# Patient Record
Sex: Female | Born: 1980
Health system: Southern US, Community
[De-identification: ages and names within clinical notes are randomized; demographics above are authoritative.]

## PROBLEM LIST (undated history)

## (undated) ENCOUNTER — Inpatient Hospital Stay (HOSPITAL_COMMUNITY): Payer: Self-pay

## (undated) ENCOUNTER — Inpatient Hospital Stay (HOSPITAL_COMMUNITY): Payer: 59

## (undated) DIAGNOSIS — T8859XA Other complications of anesthesia, initial encounter: Secondary | ICD-10-CM

## (undated) DIAGNOSIS — R112 Nausea with vomiting, unspecified: Secondary | ICD-10-CM

## (undated) DIAGNOSIS — Z87828 Personal history of other (healed) physical injury and trauma: Secondary | ICD-10-CM

## (undated) DIAGNOSIS — Z9889 Other specified postprocedural states: Secondary | ICD-10-CM

## (undated) DIAGNOSIS — R002 Palpitations: Secondary | ICD-10-CM

## (undated) DIAGNOSIS — R87629 Unspecified abnormal cytological findings in specimens from vagina: Secondary | ICD-10-CM

## (undated) DIAGNOSIS — Z9689 Presence of other specified functional implants: Secondary | ICD-10-CM

## (undated) DIAGNOSIS — F419 Anxiety disorder, unspecified: Secondary | ICD-10-CM

## (undated) DIAGNOSIS — N809 Endometriosis, unspecified: Secondary | ICD-10-CM

## (undated) DIAGNOSIS — T4145XA Adverse effect of unspecified anesthetic, initial encounter: Secondary | ICD-10-CM

## (undated) DIAGNOSIS — G43909 Migraine, unspecified, not intractable, without status migrainosus: Secondary | ICD-10-CM

## (undated) HISTORY — PX: ABDOMINOPLASTY: SUR9

## (undated) HISTORY — PX: OTHER SURGICAL HISTORY: SHX169

## (undated) HISTORY — PX: CARPAL TUNNEL RELEASE: SHX101

## (undated) HISTORY — PX: PLACEMENT OF BREAST IMPLANTS: SHX6334

## (undated) HISTORY — PX: CERVICAL BIOPSY: SHX590

---

## 1999-02-09 ENCOUNTER — Other Ambulatory Visit: Admission: RE | Admit: 1999-02-09 | Discharge: 1999-02-09 | Payer: Self-pay | Admitting: Obstetrics and Gynecology

## 1999-09-29 ENCOUNTER — Inpatient Hospital Stay (HOSPITAL_COMMUNITY): Admission: AD | Admit: 1999-09-29 | Discharge: 1999-09-29 | Payer: Self-pay | Admitting: Obstetrics & Gynecology

## 1999-11-09 ENCOUNTER — Other Ambulatory Visit: Admission: RE | Admit: 1999-11-09 | Discharge: 1999-11-09 | Payer: Self-pay | Admitting: Obstetrics & Gynecology

## 1999-11-30 ENCOUNTER — Ambulatory Visit (HOSPITAL_COMMUNITY): Admission: RE | Admit: 1999-11-30 | Discharge: 1999-11-30 | Payer: Self-pay | Admitting: Obstetrics and Gynecology

## 1999-11-30 ENCOUNTER — Encounter: Payer: Self-pay | Admitting: Obstetrics and Gynecology

## 2000-02-04 ENCOUNTER — Inpatient Hospital Stay (HOSPITAL_COMMUNITY): Admission: AD | Admit: 2000-02-04 | Discharge: 2000-02-04 | Payer: Self-pay | Admitting: Obstetrics & Gynecology

## 2000-02-24 ENCOUNTER — Inpatient Hospital Stay (HOSPITAL_COMMUNITY): Admission: AD | Admit: 2000-02-24 | Discharge: 2000-02-24 | Payer: Self-pay | Admitting: Obstetrics and Gynecology

## 2000-03-30 ENCOUNTER — Inpatient Hospital Stay (HOSPITAL_COMMUNITY): Admission: AD | Admit: 2000-03-30 | Discharge: 2000-03-30 | Payer: Self-pay | Admitting: Obstetrics and Gynecology

## 2000-04-21 ENCOUNTER — Inpatient Hospital Stay (HOSPITAL_COMMUNITY): Admission: AD | Admit: 2000-04-21 | Discharge: 2000-04-21 | Payer: Self-pay | Admitting: Obstetrics and Gynecology

## 2000-05-31 ENCOUNTER — Inpatient Hospital Stay (HOSPITAL_COMMUNITY): Admission: AD | Admit: 2000-05-31 | Discharge: 2000-06-02 | Payer: Self-pay | Admitting: Obstetrics and Gynecology

## 2001-12-22 ENCOUNTER — Other Ambulatory Visit: Admission: RE | Admit: 2001-12-22 | Discharge: 2001-12-22 | Payer: Self-pay | Admitting: Obstetrics and Gynecology

## 2002-05-02 ENCOUNTER — Encounter: Payer: Self-pay | Admitting: Emergency Medicine

## 2002-05-02 ENCOUNTER — Emergency Department (HOSPITAL_COMMUNITY): Admission: EM | Admit: 2002-05-02 | Discharge: 2002-05-02 | Payer: Self-pay

## 2002-07-08 ENCOUNTER — Inpatient Hospital Stay (HOSPITAL_COMMUNITY): Admission: AD | Admit: 2002-07-08 | Discharge: 2002-07-08 | Payer: Self-pay | Admitting: Obstetrics and Gynecology

## 2002-07-30 ENCOUNTER — Inpatient Hospital Stay (HOSPITAL_COMMUNITY): Admission: AD | Admit: 2002-07-30 | Discharge: 2002-08-02 | Payer: Self-pay | Admitting: Obstetrics and Gynecology

## 2002-08-09 ENCOUNTER — Encounter: Admission: RE | Admit: 2002-08-09 | Discharge: 2002-09-08 | Payer: Self-pay | Admitting: Obstetrics and Gynecology

## 2003-02-18 ENCOUNTER — Other Ambulatory Visit: Admission: RE | Admit: 2003-02-18 | Discharge: 2003-02-18 | Payer: Self-pay | Admitting: Obstetrics and Gynecology

## 2003-02-21 ENCOUNTER — Emergency Department (HOSPITAL_COMMUNITY): Admission: EM | Admit: 2003-02-21 | Discharge: 2003-02-21 | Payer: Self-pay | Admitting: Emergency Medicine

## 2003-02-23 ENCOUNTER — Ambulatory Visit (HOSPITAL_COMMUNITY): Admission: RE | Admit: 2003-02-23 | Discharge: 2003-02-23 | Payer: Self-pay | Admitting: Obstetrics and Gynecology

## 2004-06-12 ENCOUNTER — Other Ambulatory Visit: Admission: RE | Admit: 2004-06-12 | Discharge: 2004-06-12 | Payer: Self-pay | Admitting: Obstetrics and Gynecology

## 2005-01-18 ENCOUNTER — Inpatient Hospital Stay (HOSPITAL_COMMUNITY): Admission: AD | Admit: 2005-01-18 | Discharge: 2005-01-18 | Payer: Self-pay | Admitting: Obstetrics and Gynecology

## 2005-01-25 ENCOUNTER — Ambulatory Visit: Payer: Self-pay | Admitting: Cardiology

## 2005-02-16 ENCOUNTER — Ambulatory Visit: Payer: Self-pay | Admitting: Cardiology

## 2005-03-02 ENCOUNTER — Other Ambulatory Visit: Admission: RE | Admit: 2005-03-02 | Discharge: 2005-03-02 | Payer: Self-pay | Admitting: Obstetrics and Gynecology

## 2005-04-19 ENCOUNTER — Inpatient Hospital Stay (HOSPITAL_COMMUNITY): Admission: AD | Admit: 2005-04-19 | Discharge: 2005-04-19 | Payer: Self-pay | Admitting: Obstetrics and Gynecology

## 2005-04-21 ENCOUNTER — Inpatient Hospital Stay (HOSPITAL_COMMUNITY): Admission: AD | Admit: 2005-04-21 | Discharge: 2005-04-21 | Payer: Self-pay | Admitting: Obstetrics & Gynecology

## 2005-04-28 ENCOUNTER — Inpatient Hospital Stay (HOSPITAL_COMMUNITY): Admission: AD | Admit: 2005-04-28 | Discharge: 2005-04-28 | Payer: Self-pay | Admitting: Obstetrics & Gynecology

## 2005-05-09 ENCOUNTER — Inpatient Hospital Stay (HOSPITAL_COMMUNITY): Admission: AD | Admit: 2005-05-09 | Discharge: 2005-05-09 | Payer: Self-pay | Admitting: Obstetrics and Gynecology

## 2005-05-11 ENCOUNTER — Inpatient Hospital Stay (HOSPITAL_COMMUNITY): Admission: AD | Admit: 2005-05-11 | Discharge: 2005-05-12 | Payer: Self-pay | Admitting: Obstetrics & Gynecology

## 2005-07-03 ENCOUNTER — Other Ambulatory Visit: Admission: RE | Admit: 2005-07-03 | Discharge: 2005-07-03 | Payer: Self-pay | Admitting: Obstetrics and Gynecology

## 2006-01-11 ENCOUNTER — Emergency Department (HOSPITAL_COMMUNITY): Admission: EM | Admit: 2006-01-11 | Discharge: 2006-01-11 | Payer: Self-pay | Admitting: Emergency Medicine

## 2008-08-03 ENCOUNTER — Emergency Department (HOSPITAL_COMMUNITY): Admission: EM | Admit: 2008-08-03 | Discharge: 2008-08-03 | Payer: Self-pay | Admitting: Emergency Medicine

## 2009-08-20 ENCOUNTER — Emergency Department (HOSPITAL_BASED_OUTPATIENT_CLINIC_OR_DEPARTMENT_OTHER): Admission: EM | Admit: 2009-08-20 | Discharge: 2009-08-20 | Payer: Self-pay | Admitting: Emergency Medicine
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
## 2011-03-09 NOTE — Consult Note (Signed)
Kelly Buck, Kelly Buck              ACCOUNT NO.:  [id number]   MEDICAL RECORD NO.:  [id number]           PATIENT TYPE:   LOCATION:                                 FACILITY:   PHYSICIAN:  Rollene Rotunda, M.D.   DATE OF BIRTH:  1981/06/22   DATE OF CONSULTATION:  02/16/2005  DATE OF DISCHARGE:                                   CONSULTATION   REASON FOR PRESENTATION:  Evaluate the patient with palpitations.   HISTORY OF PRESENT ILLNESS:  The patient is a lovely 30 year old white  female who is now about [redacted] weeks pregnant with her third child.  I saw her  on April 6.  She had been having episodes of palpitations which have been  lasting up to 45 minutes.  She was seen at the emergency room at [facility].  I did some routine blood work including a magnesium and basic  metabolic profile.  These were within normal limits.  Other labs have been  normal prior to this including TSH.  I did place a Holter monitor which  demonstrated normal sinus rhythm or sinus tachycardia sporadically.  She had  a few PACs.  There was no ventricular ectopy recorded, at all.  The PACs  were infrequent.  The patient did not report any symptoms wearing this.  She  now returns for follow up.  She says that since I saw her, she has been able  to remove some of the stress in her life.  She has been able to do a few of  the household chores.  She says she thinks this has made a big difference.  She is now rarely having palpitations.  They may last for a couple of  minutes at the worse.  She has had no presyncope or syncope.  She has had no  chest pain.  She denies any shortness of breath, PND, or orthopnea.  She has  also cut out the small amount of caffeine that she was taking.   PAST MEDICAL HISTORY:  Normal pregnancies x 2.  Otherwise, negative.   ALLERGIES:  None.   MEDICATIONS:  Prenatal vitamins.   REVIEW OF SYMPTOMS:  As stated in the HPI and negative for all other  systems.   PHYSICAL  EXAMINATION:  GENERAL:  The patient is in no distress.  VITAL SIGNS:  Blood pressure 120/72, heart rate 76 and regular, weight 215  pounds, body mass index 36.  NECK:  No jugular venous distention, wave form within normal limits, carotid  upstroke brisk and symmetric, no bruits, no thyromegaly.  LYMPHATICS:  No cervical, axillary, or inguinal adenopathy.  LUNGS:  Clear to auscultation bilaterally.  HEART:  PMI non displaced or sustained.  S1 and S2 within normal limits.  No  S3, S4, or murmurs.  ABDOMEN:  Gravid, positive bowel sounds, no rebound, no guarding, no obvious  organomegaly.  SKIN:  No rashes, no nodules.  EXTREMITIES:  2+ pulses throughout, no edema, no cyanosis, no clubbing.  NEUROLOGICAL:  Oriented to person, place and time.  Cranial nerves 2-12  grossly intact.  Motor grossly intact.  LABORATORY DATA:  EKG not done this visit.   ASSESSMENT/PLAN:  Palpitations.  The patient has had a few episodes of PACs  recorded on her event recorder.  She has not had a documented arrhythmia  otherwise.  She had no premature ventricular contractions, at all.  She has  a perfectly normal baseline EKG, otherwise.  There are no high risk  historical features, in particular, no syncope or presyncope.  Based on all  of this, the patient most likely was experiencing some supraventricular  arrhythmia that we cannot document. It is very unlikely to be ventricular or  to be related to structural heart disease.  Given this, will follow her  symptomatically.   FOLLOW UP:  I would like to see her back in June before her baby is due in  July.  She knows to call me if she has any worsening palpitations and to  present to the emergency room  should she have any presyncope or syncope.                                        ___________________________________________  Rollene Rotunda, M.D.    JH/MEDQ  D:  02/16/2005  T:  02/16/2005  Job:  [id number]   cc:   Gerrit Friends. Aldona Bar, M.D.  [address], [address]  [place]  [place] [address]  Fax: [phone]

## 2012-04-08 ENCOUNTER — Other Ambulatory Visit (HOSPITAL_COMMUNITY)
Admission: RE | Admit: 2012-04-08 | Discharge: 2012-04-08 | Disposition: A | Discharge status: Home / Self Care | Payer: BC Managed Care – PPO | Source: Ambulatory Visit | Attending: Family Medicine | Admitting: Family Medicine

## 2012-04-08 DIAGNOSIS — Z124 Encounter for screening for malignant neoplasm of cervix: Secondary | ICD-10-CM | POA: Insufficient documentation

## 2013-07-20 ENCOUNTER — Emergency Department (HOSPITAL_COMMUNITY)
Admission: EM | Admit: 2013-07-20 | Discharge: 2013-07-20 | Disposition: A | Discharge status: Home / Self Care | Payer: BC Managed Care – PPO | Attending: Emergency Medicine | Admitting: Emergency Medicine

## 2013-07-20 ENCOUNTER — Encounter (HOSPITAL_COMMUNITY): Payer: Self-pay | Admitting: Nurse Practitioner

## 2013-07-20 DIAGNOSIS — T1590XA Foreign body on external eye, part unspecified, unspecified eye, initial encounter: Secondary | ICD-10-CM | POA: Insufficient documentation

## 2013-07-20 DIAGNOSIS — Y939 Activity, unspecified: Secondary | ICD-10-CM | POA: Insufficient documentation

## 2013-07-20 DIAGNOSIS — Y929 Unspecified place or not applicable: Secondary | ICD-10-CM | POA: Insufficient documentation

## 2013-07-20 DIAGNOSIS — H5710 Ocular pain, unspecified eye: Secondary | ICD-10-CM | POA: Insufficient documentation

## 2013-07-20 DIAGNOSIS — H5712 Ocular pain, left eye: Secondary | ICD-10-CM

## 2013-07-20 MED ORDER — TOBRAMYCIN 0.3 % OP SOLN
2.0000 [drp] | Freq: Four times a day (QID) | OPHTHALMIC | Status: DC
  Administered 2013-07-20: 2 [drp] via OPHTHALMIC
  Filled 2013-07-20: qty 10

## 2013-07-20 MED ORDER — FLUORESCEIN SODIUM 1 MG OP STRP
1.0000 | ORAL_STRIP | Freq: Once | OPHTHALMIC | Status: AC
  Administered 2013-07-20: 1 via OPHTHALMIC
  Filled 2013-07-20: qty 1

## 2013-07-20 MED ORDER — PROPARACAINE HCL 0.5 % OP SOLN
2.0000 [drp] | Freq: Once | OPHTHALMIC | Status: AC
  Administered 2013-07-20: 2 [drp] via OPHTHALMIC
  Filled 2013-07-20: qty 15

## 2013-07-20 NOTE — ED Provider Notes (Signed)
CSN: [id number]     Arrival date & time 07/20/13  0906 History  This chart was scribed for non-physician practitioner Allean Found, PA-C working with Layla Maw Ward, DO by Valera Castle, ED scribe. This patient was seen in room TR04C/TR04C and the patient's care was started at 9:23 AM.    Chief Complaint  Patient presents with  . Eye Injury    Patient is a 32 y.o. female presenting with eye injury. The history is provided by the patient. No language interpreter was used.  Eye Injury This is a new problem. The current episode started 2 days ago. The problem occurs constantly. The problem has not changed since onset.Nothing aggravates the symptoms. Nothing relieves the symptoms.   HPI Comments: Kelly Buck is a 32 y.o. female who presents to the Emergency Department complaining of sudden, moderate, constant left eye pain, with redness, onset 2 days ago. She reports that she tried to remove an object from her eye last night, but was unsuccessful, and thinks she may have scratched the surface of her eye. She reports that the object does not feel loose, but rather stuck in one position. She tried to sleep it off, but the pain was still there this morning, and she reports a visible moon-shaped object in her eye. She denies light sensitivity, but states that her vision is blurry. She denies fever, and any other associated symptoms. She denies having a medical history.   No past medical history on file. No past surgical history on file. No family history on file. History  Substance Use Topics  . Smoking status: Not on file  . Smokeless tobacco: Not on file  . Alcohol Use: Not on file   OB History   No data available     Review of Systems  Constitutional: Negative for fever.  Eyes: Positive for pain (Left eye pain.) and redness. Negative for photophobia, discharge and visual disturbance.  All other systems reviewed and are negative.    Allergies  Review of patient's allergies  indicates not on file.  Home Medications  No current outpatient prescriptions on file.  Triage Vitals: BP 132/90  Pulse 85  Temp(Src) 97.7 F (36.5 C) (Oral)  SpO2 98%  Physical Exam  Nursing note and vitals reviewed. Constitutional: She is oriented to person, place, and time. She appears well-developed and well-nourished. No distress.  HENT:  Head: Normocephalic and atraumatic.  Eyes: Conjunctivae and EOM are normal. Pupils are equal, round, and reactive to light. Right eye exhibits no discharge. Left eye exhibits no discharge.  No corneal abrasion. Lateral sclera injection without laceration or foreign body. No pain with full ROM. External eye adnexa normal.   Neck: Neck supple. No tracheal deviation present.  Cardiovascular: Normal rate, regular rhythm and normal heart sounds.   Pulmonary/Chest: Effort normal and breath sounds normal. No respiratory distress.  Abdominal: Soft. There is no tenderness.  Musculoskeletal: Normal range of motion.  Neurological: She is alert and oriented to person, place, and time.  Skin: Skin is warm and dry.  Psychiatric: She has a normal mood and affect. Her behavior is normal.    ED Course  Procedures (including critical care time)  DIAGNOSTIC STUDIES: Oxygen Saturation is 98% on room air, normal by my interpretation.    COORDINATION OF CARE: 9:37 AM-Discussed treatment plan which includes Tobrex, Alcaine, and one fluorescein opthalmic strip with pt at bedside and pt agreed to plan.     Labs Review Labs Reviewed - No data to display  Imaging Review No results found.  MDM  No diagnosis found. 1. Left eye pain  No FB, scleral laceration, chemosis or apparent corneal injury. Will cover with abx drops and refer to ophthalmology if symptoms do not improve in 1-2 days.      I personally performed the services described in this documentation, which was scribed in my presence. The recorded information has been reviewed and is accurate.      Arnoldo Hooker, PA-C 07/20/13 1044

## 2013-07-20 NOTE — ED Notes (Signed)
States she felt something in her L eye yesterday, tried to get it out with her finger, and had a burning pain in her eye. This am she woke with more L eye pain and noticed redness "and it looks like theres something in my eye." states her vision is blurry in the L eye. Denies any injuries

## 2013-07-20 NOTE — ED Notes (Signed)
Redness noted in lateral aspect of left eye. Vision intact.

## 2013-07-20 NOTE — ED Provider Notes (Signed)
Medical screening examination/treatment/procedure(s) were performed by non-physician practitioner and as supervising physician I was immediately available for consultation/collaboration.   Layla Maw Adeeb Konecny, DO 07/20/13 1605

## 2013-09-24 LAB — OB RESULTS CONSOLE GC/CHLAMYDIA
Chlamydia: NEGATIVE
Gonorrhea: NEGATIVE

## 2013-10-19 ENCOUNTER — Ambulatory Visit (HOSPITAL_COMMUNITY)
Admission: RE | Admit: 2013-10-19 | Discharge: 2013-10-19 | Disposition: A | Discharge status: Home / Self Care | Payer: Medicaid Other | Source: Ambulatory Visit | Attending: Obstetrics and Gynecology | Admitting: Obstetrics and Gynecology

## 2013-10-19 ENCOUNTER — Other Ambulatory Visit (HOSPITAL_COMMUNITY): Payer: Self-pay | Admitting: Obstetrics and Gynecology

## 2013-10-19 DIAGNOSIS — O208 Other hemorrhage in early pregnancy: Secondary | ICD-10-CM | POA: Insufficient documentation

## 2013-10-19 DIAGNOSIS — O009 Unspecified ectopic pregnancy without intrauterine pregnancy: Secondary | ICD-10-CM
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
## 2013-11-20 LAB — OB RESULTS CONSOLE ABO/RH: RH Type: POSITIVE

## 2013-11-20 LAB — OB RESULTS CONSOLE RPR: RPR: NONREACTIVE

## 2013-11-20 LAB — OB RESULTS CONSOLE HEPATITIS B SURFACE ANTIGEN: Hepatitis B Surface Ag: NEGATIVE

## 2013-11-20 LAB — OB RESULTS CONSOLE HIV ANTIBODY (ROUTINE TESTING): HIV: NONREACTIVE

## 2013-11-20 LAB — OB RESULTS CONSOLE RUBELLA ANTIBODY, IGM: RUBELLA: IMMUNE

## 2014-01-28 ENCOUNTER — Inpatient Hospital Stay (HOSPITAL_COMMUNITY)
Admission: AD | Admit: 2014-01-28 | Discharge: 2014-01-28 | Disposition: A | Discharge status: Home / Self Care | Payer: Medicaid Other | Source: Ambulatory Visit | Attending: Obstetrics and Gynecology | Admitting: Obstetrics and Gynecology

## 2014-01-28 ENCOUNTER — Encounter (HOSPITAL_COMMUNITY): Payer: Self-pay | Admitting: *Deleted

## 2014-01-28 DIAGNOSIS — M545 Low back pain, unspecified: Secondary | ICD-10-CM | POA: Insufficient documentation

## 2014-01-28 DIAGNOSIS — O99891 Other specified diseases and conditions complicating pregnancy: Secondary | ICD-10-CM | POA: Insufficient documentation

## 2014-01-28 DIAGNOSIS — O9989 Other specified diseases and conditions complicating pregnancy, childbirth and the puerperium: Secondary | ICD-10-CM

## 2014-01-28 DIAGNOSIS — R109 Unspecified abdominal pain: Secondary | ICD-10-CM | POA: Insufficient documentation

## 2014-01-28 DIAGNOSIS — M549 Dorsalgia, unspecified: Secondary | ICD-10-CM

## 2014-01-28 LAB — URINALYSIS, ROUTINE W REFLEX MICROSCOPIC
Bilirubin Urine: NEGATIVE
Glucose, UA: NEGATIVE mg/dL
HGB URINE DIPSTICK: NEGATIVE
Ketones, ur: NEGATIVE mg/dL
Leukocytes, UA: NEGATIVE
Nitrite: NEGATIVE
PH: 7 (ref 5.0–8.0)
Protein, ur: NEGATIVE mg/dL
SPECIFIC GRAVITY, URINE: 1.01 (ref 1.005–1.030)
Urobilinogen, UA: 0.2 mg/dL (ref 0.0–1.0)

## 2014-01-28 NOTE — MAU Provider Note (Signed)
Chief Complaint:  Abdominal Pain and Back Pain   First Provider Initiated Contact with Patient 01/28/14 1719      HPI: Kelly Buck is a 33 y.o. [phone] at [redacted]w[redacted]d who presents to maternity admissions reporting  Onset about 2 AM today of continuous dull low back pain and lower abdominal cramping with intermittent abdominal tightening. No antecedent lifting or strenuous activity. Not related to movement, activity or rest. No previous episodes. Denies dysuria, hematuria, urgency or frequency of urination. No irritative vaginal discharge. Denies leakage of fluid or vaginal bleeding. Good fetal movement.   Pregnancy Course: Uncomplicated  Past Medical History: Past Medical History  Diagnosis Date  . Medical history non-contributory     Past obstetric history: OB History  Gravida Para Term Preterm AB SAB TAB Ectopic Multiple Living  5 3 3  1 1    3     # Outcome Date GA Lbr Len/2nd Weight Sex Delivery Anes PTL Lv  5 CUR           4 SAB           3 TRM           2 TRM           1 TRM             NSVD x3  Past Surgical History: Past Surgical History  Procedure Laterality Date  . Placement of breast implants    . Carpal tunnel release    . Tummy tuck       Social History: History  Substance Use Topics  . Smoking status: Never Smoker   . Smokeless tobacco: Not on file  . Alcohol Use: No    Allergies: No Known Allergies  Meds:  Prescriptions prior to admission  Medication Sig Dispense Refill  . Multiple Vitamin (MULTIVITAMIN WITH MINERALS) TABS tablet Take 1 tablet by mouth daily.        ROS: Pertinent findings in history of present illness.  Physical Exam  Blood pressure 131/74, pulse 102, temperature 99.8 F (37.7 C), temperature source Oral, resp. rate 20, height 5\' 5"  (1.651 m), weight 103.42 kg (228 lb). GENERAL: Well-developed, well-nourished female in no acute distress.  HEENT: normocephalic HEART: normal rate RESP: normal effort ABDOMEN: Soft, non-tender,  gravid appropriate for gestational age, DT 26 BACK: Negative CVAT, no paraspinous tenderness to palpation EXTREMITIES: Nontender, no edema NEURO: alert and oriented  Dilation: Closed Effacement (%): Thick Cervical Position: Posterior Exam by:: D. Chad Donoghue CNM  Toco: No contractions   Labs: Results for orders placed during the hospital encounter of 01/28/14 (from the past 24 hour(s))  URINALYSIS, ROUTINE W REFLEX MICROSCOPIC     Status: None   Collection Time    01/28/14  5:00 PM      Result Value Ref Range   Color, Urine YELLOW  YELLOW   APPearance CLEAR  CLEAR   Specific Gravity, Urine 1.010  1.005 - 1.030   pH 7.0  5.0 - 8.0   Glucose, UA NEGATIVE  NEGATIVE mg/dL   Hgb urine dipstick NEGATIVE  NEGATIVE   Bilirubin Urine NEGATIVE  NEGATIVE   Ketones, ur NEGATIVE  NEGATIVE mg/dL   Protein, ur NEGATIVE  NEGATIVE mg/dL   Urobilinogen, UA 0.2  0.0 - 1.0 mg/dL   Nitrite NEGATIVE  NEGATIVE   Leukocytes, UA NEGATIVE  NEGATIVE    Imaging:  No results found.  MAU Course: C/W Dr. Philis Pique  Assessment: 1. Back pain complicating pregnancy in second trimester  Multip at [redacted]w[redacted]d  Plan: Discharge home with reassurance AVS: back precautions, exercises Acetaminophen for pain    Medication List         acetaminophen 500 MG tablet  Commonly known as:  TYLENOL  Take 500 mg by mouth every 6 (six) hours as needed for moderate pain.     dimenhyDRINATE 50 MG tablet  Commonly known as:  DRAMAMINE  Take 50 mg by mouth every 8 (eight) hours as needed for dizziness.     prenatal vitamin w/FE, FA 29-1 MG Chew chewable tablet  Chew 1 tablet by mouth daily at 12 noon.       Follow-up Information   Follow up with HORVATH,MICHELLE A, MD. (Keep your scheduled prenatal appointment)    Specialty:  Obstetrics and Gynecology   Contact information:   [address] [place] [address] [phone]       Lorene Dy, CNM 01/28/2014 5:36 PM

## 2014-01-28 NOTE — MAU Note (Signed)
Woke up during the night with pain when got up to urinate.  Sharp pain lower abd.  When woke up has had low back pain.pains have continued. Having some really low pressure, crotch area. Denies frequency, urgency or pain with urination.  Neg CVA pain.

## 2014-01-28 NOTE — Discharge Instructions (Signed)
Back Pain in Pregnancy °Back pain during pregnancy is common. It happens in about half of all pregnancies. It is important for you and your baby that you remain active during your pregnancy. If you feel that back pain is not allowing you to remain active or sleep well, it is time to see your caregiver. Back pain may be caused by several factors related to changes during your pregnancy. Fortunately, unless you had trouble with your back before your pregnancy, the pain is likely to get better after you deliver. °Low back pain usually occurs between the fifth and seventh months of pregnancy. It can, however, happen in the first couple months. Factors that increase the risk of back problems include:  °· Previous back problems. °· Injury to your back. °· Having twins or multiple births. °· A chronic cough. °· Stress. °· Job-related repetitive motions. °· Muscle or spinal disease in the back. °· Family history of back problems, ruptured (herniated) discs, or osteoporosis. °· Depression, anxiety, and panic attacks. °CAUSES  °· When you are pregnant, your body produces a hormone called relaxin. This hormone makes the ligaments connecting the low back and pubic bones more flexible. This flexibility allows the baby to be delivered more easily. When your ligaments are loose, your muscles need to work harder to support your back. Soreness in your back can come from tired muscles. Soreness can also come from back tissues that are irritated since they are receiving less support. °· As the baby grows, it puts pressure on the nerves and blood vessels in your pelvis. This can cause back pain. °· As the baby grows and gets heavier during pregnancy, the uterus pushes the stomach muscles forward and changes your center of gravity. This makes your back muscles work harder to maintain good posture. °SYMPTOMS  °Lumbar pain during pregnancy °Lumbar pain during pregnancy usually occurs at or above the waist in the center of the back. There  may be pain and numbness that radiates into your leg or foot. This is similar to low back pain experienced by non-pregnant women. It usually increases with sitting for long periods of time, standing, or repetitive lifting. Tenderness may also be present in the muscles along your upper back. °Posterior pelvic pain during pregnancy °Pain in the back of the pelvis is more common than lumbar pain in pregnancy. It is a deep pain felt in your side at the waistline, or across the tailbone (sacrum), or in both places. You may have pain on one or both sides. This pain can also go into the buttocks and backs of the upper thighs. Pubic and groin pain may also be present. The pain does not quickly resolve with rest, and morning stiffness may also be present. °Pelvic pain during pregnancy can be brought on by most activities. A high level of fitness before and during pregnancy may or may not prevent this problem. Labor pain is usually 1 to 2 minutes apart, lasts for about 1 minute, and involves a bearing down feeling or pressure in your pelvis. However, if you are at term with the pregnancy, constant low back pain can be the beginning of early labor, and you should be aware of this. °DIAGNOSIS  °X-rays of the back should not be done during the first 12 to 14 weeks of the pregnancy and only when absolutely necessary during the rest of the pregnancy. MRIs do not give off radiation and are safe during pregnancy. MRIs also should only be done when absolutely necessary. °HOME CARE INSTRUCTIONS °· Exercise   as directed by your caregiver. Exercise is the most effective way to prevent or manage back pain. If you have a back problem, it is especially important to avoid sports that require sudden body movements. Swimming and walking are great activities.  Do not stand in one place for long periods of time.  Do not wear high heels.  Sit in chairs with good posture. Use a pillow on your lower back if necessary. Make sure your head  rests over your shoulders and is not hanging forward.  Try sleeping on your side, preferably the left side, with a pillow or two between your legs. If you are sore after a night's rest, your bedmay betoo soft.Try placing a board between your mattress and box spring.  Listen to your body when lifting.If you are experiencing pain, ask for help or try bending yourknees more so you can use your leg muscles rather than your back muscles. Squat down when picking up something from the floor. Do not bend over.  Eat a healthy diet. Try to gain weight within your caregiver's recommendations.  Use heat or cold packs 3 to 4 times a day for 15 minutes to help with the pain.  Only take over-the-counter or prescription medicines for pain, discomfort, or fever as directed by your caregiver. Sudden (acute) back pain  Use bed rest for only the most extreme, acute episodes of back pain. Prolonged bed rest over 48 hours will aggravate your condition.  Ice is very effective for acute conditions.  Put ice in a plastic bag.  Place a towel between your skin and the bag.  Leave the ice on for 10 to 20 minutes every 2 hours, or as needed.  Using heat packs for 30 minutes prior to activities is also helpful. Continued back pain See your caregiver if you have continued problems. Your caregiver can help or refer you for appropriate physical therapy. With conditioning, most back problems can be avoided. Sometimes, a more serious issue may be the cause of back pain. You should be seen right away if new problems seem to be developing. Your caregiver may recommend:  A maternity girdle.  An elastic sling.  A back brace.  A massage therapist or acupuncture. SEEK MEDICAL CARE IF:   You are not able to do most of your daily activities, even when taking the pain medicine you were given.  You need a referral to a physical therapist or chiropractor.  You want to try acupuncture. SEEK IMMEDIATE MEDICAL CARE  IF:  You develop numbness, tingling, weakness, or problems with the use of your arms or legs.  You develop severe back pain that is no longer relieved with medicines.  You have a sudden change in bowel or bladder control.  You have increasing pain in other areas of the body.  You develop shortness of breath, dizziness, or fainting.  You develop nausea, vomiting, or sweating.  You have back pain which is similar to labor pains.  You have back pain along with your water breaking or vaginal bleeding.  You have back pain or numbness that travels down your leg.  Your back pain developed after you fell.  You develop pain on one side of your back. You may have a kidney stone.  You see blood in your urine. You may have a bladder infection or kidney stone.  You have back pain with blisters. You may have shingles. Back pain is fairly common during pregnancy but should not be accepted as just part of  the process. Back pain should always be treated as soon as possible. This will make your pregnancy as pleasant as possible. °Document Released: 01/16/2006 Document Revised: 12/31/2011 Document Reviewed: 02/27/2011 °[employer]® Patient Information ©2014 [employer]. ° °Back Exercises °Back exercises help treat and prevent back injuries. The goal of back exercises is to increase the strength of your abdominal and back muscles and the flexibility of your back. These exercises should be started when you no longer have back pain. Back exercises include: °· Pelvic Tilt. Lie on your back with your knees bent. Tilt your pelvis until the lower part of your back is against the floor. Hold this position 5 to 10 sec and repeat 5 to 10 times. °· Knee to Chest. Pull first 1 knee up against your chest and hold for 20 to 30 seconds, repeat this with the other knee, and then both knees. This may be done with the other leg straight or bent, whichever feels better. °· Sit-Ups or Curl-Ups. Bend your knees 90 degrees. Start  with tilting your pelvis, and do a partial, slow sit-up, lifting your trunk only 30 to 45 degrees off the floor. Take at least 2 to 3 seconds for each sit-up. Do not do sit-ups with your knees out straight. If partial sit-ups are difficult, simply do the above but with only tightening your abdominal muscles and holding it as directed. °· Hip-Lift. Lie on your back with your knees flexed 90 degrees. Push down with your feet and shoulders as you raise your hips a couple inches off the floor; hold for 10 seconds, repeat 5 to 10 times. °· Back arches. Lie on your stomach, propping yourself up on bent elbows. Slowly press on your hands, causing an arch in your low back. Repeat 3 to 5 times. Any initial stiffness and discomfort should lessen with repetition over time. °· Shoulder-Lifts. Lie face down with arms beside your body. Keep hips and torso pressed to floor as you slowly lift your head and shoulders off the floor. °Do not overdo your exercises, especially in the beginning. Exercises may cause you some mild back discomfort which lasts for a few minutes; however, if the pain is more severe, or lasts for more than 15 minutes, do not continue exercises until you see your caregiver. Improvement with exercise therapy for back problems is slow.  °See your caregivers for assistance with developing a proper back exercise program. °Document Released: 11/15/2004 Document Revised: 12/31/2011 Document Reviewed: 08/09/2011 °[employer]® Patient Information ©2014 [employer]. ° °

## 2014-02-26 ENCOUNTER — Inpatient Hospital Stay (HOSPITAL_COMMUNITY)
Admission: AD | Admit: 2014-02-26 | Discharge: 2014-02-26 | Disposition: A | Discharge status: Home / Self Care | Payer: Medicaid Other | Source: Ambulatory Visit | Attending: Obstetrics and Gynecology | Admitting: Obstetrics and Gynecology

## 2014-02-26 ENCOUNTER — Inpatient Hospital Stay (HOSPITAL_COMMUNITY): Payer: Medicaid Other

## 2014-02-26 ENCOUNTER — Encounter (HOSPITAL_COMMUNITY): Payer: Self-pay | Admitting: *Deleted

## 2014-02-26 DIAGNOSIS — O99891 Other specified diseases and conditions complicating pregnancy: Secondary | ICD-10-CM | POA: Insufficient documentation

## 2014-02-26 DIAGNOSIS — N949 Unspecified condition associated with female genital organs and menstrual cycle: Secondary | ICD-10-CM

## 2014-02-26 DIAGNOSIS — R102 Pelvic and perineal pain: Secondary | ICD-10-CM

## 2014-02-26 DIAGNOSIS — O321XX0 Maternal care for breech presentation, not applicable or unspecified: Secondary | ICD-10-CM | POA: Insufficient documentation

## 2014-02-26 DIAGNOSIS — O26899 Other specified pregnancy related conditions, unspecified trimester: Secondary | ICD-10-CM

## 2014-02-26 DIAGNOSIS — O9989 Other specified diseases and conditions complicating pregnancy, childbirth and the puerperium: Secondary | ICD-10-CM

## 2014-02-26 LAB — URINALYSIS, ROUTINE W REFLEX MICROSCOPIC
Bilirubin Urine: NEGATIVE
Glucose, UA: NEGATIVE mg/dL
Hgb urine dipstick: NEGATIVE
Ketones, ur: NEGATIVE mg/dL
Nitrite: NEGATIVE
Protein, ur: NEGATIVE mg/dL
Specific Gravity, Urine: <1.005 — ABNORMAL LOW (ref 1.005–1.030)
Urobilinogen, UA: 0.2 mg/dL (ref 0.0–1.0)
pH: 6 (ref 5.0–8.0)

## 2014-02-26 LAB — FETAL FIBRONECTIN: Fetal Fibronectin: NEGATIVE

## 2014-02-26 LAB — WET PREP, GENITAL
Trich, Wet Prep: NONE SEEN
Yeast Wet Prep HPF POC: NONE SEEN

## 2014-02-26 LAB — URINE MICROSCOPIC-ADD ON

## 2014-02-26 LAB — AMNISURE RUPTURE OF MEMBRANE (ROM) NOT AT ARMC: Amnisure ROM: NEGATIVE

## 2014-02-26 NOTE — MAU Note (Signed)
Patient states she has been having "horrible" cervical pain that is shooting and has kept her up all night. States she has a problem with leaking urine but no bleeding or discharge.

## 2014-02-26 NOTE — MAU Provider Note (Signed)
History     CSN: [id number]  Arrival date and time: 02/26/14 3762   None     Chief Complaint  Patient presents with  . Vaginal Pain   HPI  Ms. Kelly Buck is a 33 y.o. female [phone] at [redacted]w[redacted]d who presents with with vaginal pain that she describes as stabbing pain. " I feel like the babies foot is going to fall out". The baby is breech, and she feels the kicks in her vagina. She also feels like she is constantly leaking urine. She is unsure whether it is from the baby kicking on her bladder, however the patient feels the leaking all the time. She reports good fetal movement, vaginal bleeding, vaginal itching/burning, urinary symptoms, h/a, dizziness, n/v, or fever/chills.  No history of preterm deliveries.   OB History   Grav Para Term Preterm Abortions TAB SAB Ect Mult Living   5 3 3  1  1   3       Past Medical History  Diagnosis Date  . Medical history non-contributory     Past Surgical History  Procedure Laterality Date  . Placement of breast implants    . Carpal tunnel release    . Tummy tuck      History reviewed. No pertinent family history.  History  Substance Use Topics  . Smoking status: Never Smoker   . Smokeless tobacco: Not on file  . Alcohol Use: No    Allergies: No Known Allergies  Prescriptions prior to admission  Medication Sig Dispense Refill  . acetaminophen (TYLENOL) 500 MG tablet Take 500 mg by mouth every 6 (six) hours as needed for moderate pain.      Marland Kitchen dimenhyDRINATE (DRAMAMINE) 50 MG tablet Take 50 mg by mouth every 8 (eight) hours as needed for dizziness.      . prenatal vitamin w/FE, FA (NATACHEW) 29-1 MG CHEW chewable tablet Chew 1 tablet by mouth daily at 12 noon.       Results for orders placed during the hospital encounter of 02/26/14 (from the past 48 hour(s))  URINALYSIS, ROUTINE W REFLEX MICROSCOPIC     Status: Abnormal   Collection Time    02/26/14  9:38 AM      Result Value Ref Range   Color, Urine YELLOW  YELLOW   APPearance CLEAR  CLEAR   Specific Gravity, Urine <1.005 (*) 1.005 - 1.030   pH 6.0  5.0 - 8.0   Glucose, UA NEGATIVE  NEGATIVE mg/dL   Hgb urine dipstick NEGATIVE  NEGATIVE   Bilirubin Urine NEGATIVE  NEGATIVE   Ketones, ur NEGATIVE  NEGATIVE mg/dL   Protein, ur NEGATIVE  NEGATIVE mg/dL   Urobilinogen, UA 0.2  0.0 - 1.0 mg/dL   Nitrite NEGATIVE  NEGATIVE   Leukocytes, UA TRACE (*) NEGATIVE  URINE MICROSCOPIC-ADD ON     Status: None   Collection Time    02/26/14  9:38 AM      Result Value Ref Range   Squamous Epithelial / LPF RARE  RARE   WBC, UA 0-2  <3 WBC/hpf   RBC / HPF 0-2  <3 RBC/hpf   Bacteria, UA RARE  RARE  WET PREP, GENITAL     Status: Abnormal   Collection Time    02/26/14 10:10 AM      Result Value Ref Range   Yeast Wet Prep HPF POC NONE SEEN  NONE SEEN   Trich, Wet Prep NONE SEEN  NONE SEEN   Clue Cells Wet Prep HPF  POC FEW (*) NONE SEEN   WBC, Wet Prep HPF POC FEW (*) NONE SEEN   Comment: BACTERIA- TOO NUMEROUS TO COUNT  AMNISURE RUPTURE OF MEMBRANE (ROM)     Status: None   Collection Time    02/26/14 10:10 AM      Result Value Ref Range   Amnisure ROM NEGATIVE    FETAL FIBRONECTIN     Status: None   Collection Time    02/26/14 10:10 AM      Result Value Ref Range   Fetal Fibronectin NEGATIVE  NEGATIVE         Review of Systems  Constitutional: Negative for fever and chills.  Gastrointestinal: Positive for abdominal pain (+ Pelvic pain and pelvic pressure ). Negative for nausea, vomiting, diarrhea and constipation.  Genitourinary: Negative for dysuria, urgency, frequency and hematuria.       No vaginal discharge. No vaginal bleeding. No dysuria.   Neurological: Negative for headaches.   Physical Exam   Blood pressure 126/87, pulse 101, temperature 98.1 F (36.7 C), temperature source Oral, resp. rate 18, SpO2 100.00%.  Physical Exam  Constitutional: She is oriented to person, place, and time. She appears well-developed and well-nourished. No  distress.  HENT:  Head: Normocephalic.  Eyes: Pupils are equal, round, and reactive to light.  Neck: Neck supple.  Respiratory: Effort normal.  GI: Soft. She exhibits no distension. There is tenderness. There is no rebound and no guarding.  +suprapubic tenderness   Genitourinary:  Speculum exam: Vagina - Small amount of creamy discharge, no odor Cervix - No contact bleeding Bimanual exam: Dilation: Closed, posterior soft Exam by:: J. Haydon Dorris NP Hard like surface felt in posterior vaginal canal. Possible stool vs. Varicosity; non tender to touch. Chaperone present for exam.   Musculoskeletal: Normal range of motion.  Neurological: She is oriented to person, place, and time.  Skin: Skin is warm. She is not diaphoretic.  Psychiatric: Her behavior is normal.    Fetal Tracing: Baseline: 140 bpm  Variability: Moderate  Accelerations: 15x15 Decelerations: None Toco: No contractions   MAU Course  Procedures None   MDM Amnisure negative Fetal fibronectin negative  Wet prep normal  Normal cervical exam; 4.6 cm Dr. Philis Pique discussed plan of care.   Assessment and Plan   A:  1. Pelvic pain complicating pregnancy   2.  Normal cervical length on [place]; 4.6 cm   P:  Discharge home in stable condition Preterm labor precautions discussed Warning signs discussed with patient Keep your follow up appointment as scheduled   Darrelyn Hillock Levan Aloia, NP 02/26/2014. 1:58 PM

## 2014-04-12 ENCOUNTER — Encounter (HOSPITAL_COMMUNITY): Payer: Self-pay

## 2014-04-28 ENCOUNTER — Inpatient Hospital Stay (HOSPITAL_COMMUNITY): Payer: Medicaid Other

## 2014-04-28 ENCOUNTER — Observation Stay (HOSPITAL_COMMUNITY)
Admission: AD | Admit: 2014-04-28 | Discharge: 2014-04-29 | Disposition: A | Discharge status: Home / Self Care | Payer: Medicaid Other | Source: Ambulatory Visit | Attending: Obstetrics and Gynecology | Admitting: Obstetrics and Gynecology

## 2014-04-28 ENCOUNTER — Encounter (HOSPITAL_COMMUNITY): Payer: Self-pay | Admitting: *Deleted

## 2014-04-28 DIAGNOSIS — O368390 Maternal care for abnormalities of the fetal heart rate or rhythm, unspecified trimester, not applicable or unspecified: Principal | ICD-10-CM | POA: Insufficient documentation

## 2014-04-28 DIAGNOSIS — O368190 Decreased fetal movements, unspecified trimester, not applicable or unspecified: Secondary | ICD-10-CM | POA: Diagnosis present

## 2014-04-28 LAB — URINALYSIS, ROUTINE W REFLEX MICROSCOPIC
Bilirubin Urine: NEGATIVE
GLUCOSE, UA: NEGATIVE mg/dL
HGB URINE DIPSTICK: NEGATIVE
Ketones, ur: NEGATIVE mg/dL
Nitrite: NEGATIVE
Protein, ur: NEGATIVE mg/dL
UROBILINOGEN UA: 0.2 mg/dL (ref 0.0–1.0)
pH: 7 (ref 5.0–8.0)

## 2014-04-28 LAB — URINE MICROSCOPIC-ADD ON

## 2014-04-28 MED ORDER — NIFEDIPINE 10 MG PO CAPS
10.0000 mg | ORAL_CAPSULE | Freq: Once | ORAL | Status: AC
  Administered 2014-04-28: 10 mg via ORAL
  Filled 2014-04-28: qty 1

## 2014-04-28 MED ORDER — ZOLPIDEM TARTRATE 5 MG PO TABS
5.0000 mg | ORAL_TABLET | Freq: Every evening | ORAL | Status: DC | PRN
  Administered 2014-04-28: 5 mg via ORAL
  Filled 2014-04-28: qty 1

## 2014-04-28 NOTE — H&P (Signed)
Kelly Pastures, MD Physician Signed Obstetrics MAU Provider Note Service date: 04/28/2014 10:33 PM  Per NP: History      CSN: [id number]   Arrival date and time: 04/28/14 1616    First Provider Initiated Contact with Patient 04/28/14 1730          Chief Complaint   Patient presents with   .  Labor Eval   .  Decreased Fetal Movement    HPI Comments: Kelly Buck 33 y.o. [id number] [redacted]w[redacted]d presents to MAU with contractions for last 3 days. She called office yesterday and was told to drink plenty fluids and rest. She has done so and still feels contractions. Denies LOF.        Past Medical History   Diagnosis  Date   .  Medical history non-contributory         Past Surgical History   Procedure  Laterality  Date   .  Placement of breast implants       .  Carpal tunnel release       .  Tummy tuck          History reviewed. No pertinent family history.    History   Substance Use Topics   .  Smoking status:  Never Smoker    .  Smokeless tobacco:  Not on file   .  Alcohol Use:  No      Allergies: No Known Allergies    Prescriptions prior to admission   Medication  Sig  Dispense  Refill   .  acetaminophen (TYLENOL) 500 MG tablet  Take 500 mg by mouth every 6 (six) hours as needed for moderate pain.         .  calcium carbonate (TUMS - DOSED IN MG ELEMENTAL CALCIUM) 500 MG chewable tablet  Chew 3-4 tablets by mouth 2 (two) times daily as needed for indigestion or heartburn.         .  prenatal vitamin w/FE, FA (NATACHEW) 29-1 MG CHEW chewable tablet  Chew 1 tablet by mouth daily at 12 noon.            Review of Systems  Constitutional: Negative.   HENT: Negative.   Eyes: Negative.   Respiratory: Negative.   Cardiovascular: Negative.   Genitourinary:        Contractions and burning sensation in cervix  Musculoskeletal: Negative.   Skin: Negative.   Neurological: Negative.   Psychiatric/Behavioral: Negative.   Physical Exam      Blood pressure 131/84,  pulse 101, temperature 98.6 F (37 C), temperature source Oral, resp. rate 18, height 5\' 5"  (1.651 m), weight 107.321 kg (236 lb 9.6 oz), SpO2 100.00%.   Physical Exam  Constitutional: She is oriented to person, place, and time. She appears well-developed and well-nourished. No distress.  HENT:   Head: Normocephalic and atraumatic.  Eyes: Pupils are equal, round, and reactive to light.  Cardiovascular: Normal rate, regular rhythm and normal heart sounds.   Respiratory: Effort normal.  GI: Soft. She exhibits no distension. There is no tenderness. There is no rebound.  Genitourinary:  Cervix closed Recheck after ultrasound closed  Musculoskeletal: Normal range of motion.  Neurological: She is alert and oriented to person, place, and time.  Skin: Skin is warm and dry.  Psychiatric: She has a normal mood and affect. Her behavior is normal. Judgment and thought content normal.  Preliminary U/S BPP 6/10 AVI: WNL Presentation: vertex      MAU Course  Procedures   MDM   Spoke with Dr Philis Pique who advised BPP with AFI and Procardia 10 mg Pt contracting q2 minutes   My addendum: Assessment and Plan      A: Non reassuring Antenatal testing with preterm contractions.  BPP 6/10 with reactive NST.   P: Obs overnight with continuous monitoring and light diet.  Repeat BPP/AFI in am.          Amandeep Hogston A 04/28/2014, 10:33 PM

## 2014-04-28 NOTE — MAU Provider Note (Signed)
  History     CSN: [id number]  Arrival date and time: 04/28/14 1616   First Provider Initiated Contact with Patient 04/28/14 1730      Chief Complaint  Patient presents with  . Labor Eval  . Decreased Fetal Movement   HPI Comments: Kelly Buck 33 y.o. [id number] [redacted]w[redacted]d presents to MAU with contractions for last 3 days. She called office yesterday and was told to drink plenty fluids and rest. She has done so and still feels contractions. Denies LOF.     Past Medical History  Diagnosis Date  . Medical history non-contributory     Past Surgical History  Procedure Laterality Date  . Placement of breast implants    . Carpal tunnel release    . Tummy tuck      History reviewed. No pertinent family history.  History  Substance Use Topics  . Smoking status: Never Smoker   . Smokeless tobacco: Not on file  . Alcohol Use: No    Allergies: No Known Allergies  Prescriptions prior to admission  Medication Sig Dispense Refill  . acetaminophen (TYLENOL) 500 MG tablet Take 500 mg by mouth every 6 (six) hours as needed for moderate pain.      . calcium carbonate (TUMS - DOSED IN MG ELEMENTAL CALCIUM) 500 MG chewable tablet Chew 3-4 tablets by mouth 2 (two) times daily as needed for indigestion or heartburn.      . prenatal vitamin w/FE, FA (NATACHEW) 29-1 MG CHEW chewable tablet Chew 1 tablet by mouth daily at 12 noon.        Review of Systems  Constitutional: Negative.   HENT: Negative.   Eyes: Negative.   Respiratory: Negative.   Cardiovascular: Negative.   Genitourinary:       Contractions and burning sensation in cervix  Musculoskeletal: Negative.   Skin: Negative.   Neurological: Negative.   Psychiatric/Behavioral: Negative.    Physical Exam   Blood pressure 120/76, pulse 100, temperature 98.5 F (36.9 C), temperature source Oral, resp. rate 16, height 5\' 5"  (1.651 m), weight 107.321 kg (236 lb 9.6 oz), SpO2 100.00%.  Physical Exam  Constitutional: She is  oriented to person, place, and time. She appears well-developed and well-nourished. No distress.  HENT:  Head: Normocephalic and atraumatic.  Eyes: Pupils are equal, round, and reactive to light.  Cardiovascular: Normal rate, regular rhythm and normal heart sounds.   Respiratory: Effort normal.  GI: Soft. She exhibits no distension. There is no tenderness. There is no rebound.  Genitourinary:  Cervix closed Recheck after ultrasound closed  Musculoskeletal: Normal range of motion.  Neurological: She is alert and oriented to person, place, and time.  Skin: Skin is warm and dry.  Psychiatric: She has a normal mood and affect. Her behavior is normal. Judgment and thought content normal.   Preliminary U/S BPP 6/10 AVI: WNL Presentation: vertex   MAU Course  Procedures  MDM  Spoke with Dr Philis Pique who advised BPP with AFI and Procardia 10 mg Pt contracting q2 minutes  Assessment and Plan   A: Preterm Labor  P: Per Dr Philis Pique admit to antenatal  RN will take orders  Georgia Duff 04/28/2014, 5:43 PM

## 2014-04-28 NOTE — MAU Note (Signed)
Patient states the baby is breech and will be scheduled for a cesarean section.

## 2014-04-28 NOTE — MAU Note (Signed)
Patient states she has been having contractions for 3 days that are irregular. States she has had decreased fetal movement. Denies bleeding or leaking.

## 2014-04-28 NOTE — MAU Provider Note (Signed)
Per NP: History     CSN: [id number]  Arrival date and time: 04/28/14 1616   First Provider Initiated Contact with Patient 04/28/14 1730      Chief Complaint  Patient presents with  . Labor Eval  . Decreased Fetal Movement   HPI Comments: Kelly Buck 33 y.o. [id number] [redacted]w[redacted]d presents to MAU with contractions for last 3 days. She called office yesterday and was told to drink plenty fluids and rest. She has done so and still feels contractions. Denies LOF.     Past Medical History  Diagnosis Date  . Medical history non-contributory     Past Surgical History  Procedure Laterality Date  . Placement of breast implants    . Carpal tunnel release    . Tummy tuck      History reviewed. No pertinent family history.  History  Substance Use Topics  . Smoking status: Never Smoker   . Smokeless tobacco: Not on file  . Alcohol Use: No    Allergies: No Known Allergies  Prescriptions prior to admission  Medication Sig Dispense Refill  . acetaminophen (TYLENOL) 500 MG tablet Take 500 mg by mouth every 6 (six) hours as needed for moderate pain.      . calcium carbonate (TUMS - DOSED IN MG ELEMENTAL CALCIUM) 500 MG chewable tablet Chew 3-4 tablets by mouth 2 (two) times daily as needed for indigestion or heartburn.      . prenatal vitamin w/FE, FA (NATACHEW) 29-1 MG CHEW chewable tablet Chew 1 tablet by mouth daily at 12 noon.        Review of Systems  Constitutional: Negative.   HENT: Negative.   Eyes: Negative.   Respiratory: Negative.   Cardiovascular: Negative.   Genitourinary:       Contractions and burning sensation in cervix  Musculoskeletal: Negative.   Skin: Negative.   Neurological: Negative.   Psychiatric/Behavioral: Negative.    Physical Exam   Blood pressure 131/84, pulse 101, temperature 98.6 F (37 C), temperature source Oral, resp. rate 18, height 5\' 5"  (1.651 m), weight 107.321 kg (236 lb 9.6 oz), SpO2 100.00%.  Physical Exam  Constitutional: She is  oriented to person, place, and time. She appears well-developed and well-nourished. No distress.  HENT:  Head: Normocephalic and atraumatic.  Eyes: Pupils are equal, round, and reactive to light.  Cardiovascular: Normal rate, regular rhythm and normal heart sounds.   Respiratory: Effort normal.  GI: Soft. She exhibits no distension. There is no tenderness. There is no rebound.  Genitourinary:  Cervix closed Recheck after ultrasound closed  Musculoskeletal: Normal range of motion.  Neurological: She is alert and oriented to person, place, and time.  Skin: Skin is warm and dry.  Psychiatric: She has a normal mood and affect. Her behavior is normal. Judgment and thought content normal.   Preliminary U/S BPP 6/10 AVI: WNL Presentation: vertex   MAU Course  Procedures  MDM  Spoke with Dr Philis Pique who advised BPP with AFI and Procardia 10 mg Pt contracting q2 minutes  My addendum: Assessment and Plan   A: Non reassuring Antenatal testing with preterm contractions.  BPP 6/10 with reactive NST.  P: Obs overnight with continuous monitoring and light diet.  Repeat BPP/AFI in am.       Doug Bucklin A 04/28/2014, 10:33 PM

## 2014-04-29 ENCOUNTER — Observation Stay (HOSPITAL_COMMUNITY): Payer: Medicaid Other

## 2014-04-29 ENCOUNTER — Encounter (HOSPITAL_COMMUNITY): Payer: Self-pay | Admitting: *Deleted

## 2014-04-29 NOTE — Progress Notes (Signed)
Pt was evaluated by MFM. All studies are wnl. Will discharge to home. Follow up next week in office.

## 2014-04-29 NOTE — Discharge Instructions (Signed)
Fetal Movement Counts Patient Name: __________________________________________________ Patient Due Date: ____________________ Performing a fetal movement count is highly recommended in high-risk pregnancies, but it is good for every pregnant woman to do. Your caregiver may ask you to start counting fetal movements at 28 weeks of the pregnancy. Fetal movements often increase:  After eating a full meal.  After physical activity.  After eating or drinking something sweet or cold.  At rest. Pay attention to when you feel the baby is most active. This will help you notice a pattern of your baby's sleep and wake cycles and what factors contribute to an increase in fetal movement. It is important to perform a fetal movement count at the same time each day when your baby is normally most active.  HOW TO COUNT FETAL MOVEMENTS 1. Find a quiet and comfortable area to sit or lie down on your left side. Lying on your left side provides the best blood and oxygen circulation to your baby. 2. Write down the day and time on a sheet of paper or in a journal. 3. Start counting kicks, flutters, swishes, rolls, or jabs in a 2 hour period. You should feel at least 10 movements within 2 hours. 4. If you do not feel 10 movements in 2 hours, wait 2-3 hours and count again. Look for a change in the pattern or not enough counts in 2 hours. SEEK MEDICAL CARE IF:  You feel less than 10 counts in 2 hours, tried twice.  There is no movement in over an hour.  The pattern is changing or taking longer each day to reach 10 counts in 2 hours.  You feel the baby is not moving as he or she usually does. Date: ____________ Movements: ____________ Start time: ____________ Elizebeth Koller time: ____________  Date: ____________ Movements: ____________ Start time: ____________ Elizebeth Koller time: ____________ Date: ____________ Movements: ____________ Start time: ____________ Elizebeth Koller time: ____________ Date: ____________ Movements: ____________  Start time: ____________ Elizebeth Koller time: ____________ Date: ____________ Movements: ____________ Start time: ____________ Elizebeth Koller time: ____________ Date: ____________ Movements: ____________ Start time: ____________ Elizebeth Koller time: ____________ Date: ____________ Movements: ____________ Start time: ____________ Elizebeth Koller time: ____________ Date: ____________ Movements: ____________ Start time: ____________ Elizebeth Koller time: ____________  Date: ____________ Movements: ____________ Start time: ____________ Elizebeth Koller time: ____________ Date: ____________ Movements: ____________ Start time: ____________ Elizebeth Koller time: ____________ Date: ____________ Movements: ____________ Start time: ____________ Elizebeth Koller time: ____________ Date: ____________ Movements: ____________ Start time: ____________ Elizebeth Koller time: ____________ Date: ____________ Movements: ____________ Start time: ____________ Elizebeth Koller time: ____________ Date: ____________ Movements: ____________ Start time: ____________ Elizebeth Koller time: ____________ Date: ____________ Movements: ____________ Start time: ____________ Elizebeth Koller time: ____________  Date: ____________ Movements: ____________ Start time: ____________ Elizebeth Koller time: ____________ Date: ____________ Movements: ____________ Start time: ____________ Elizebeth Koller time: ____________ Date: ____________ Movements: ____________ Start time: ____________ Elizebeth Koller time: ____________ Date: ____________ Movements: ____________ Start time: ____________ Elizebeth Koller time: ____________ Date: ____________ Movements: ____________ Start time: ____________ Elizebeth Koller time: ____________ Date: ____________ Movements: ____________ Start time: ____________ Elizebeth Koller time: ____________ Date: ____________ Movements: ____________ Start time: ____________ Elizebeth Koller time: ____________  Date: ____________ Movements: ____________ Start time: ____________ Elizebeth Koller time: ____________ Date: ____________ Movements: ____________ Start time: ____________ Elizebeth Koller time:  ____________ Date: ____________ Movements: ____________ Start time: ____________ Elizebeth Koller time: ____________ Date: ____________ Movements: ____________ Start time: ____________ Elizebeth Koller time: ____________ Date: ____________ Movements: ____________ Start time: ____________ Elizebeth Koller time: ____________ Date: ____________ Movements: ____________ Start time: ____________ Elizebeth Koller time: ____________ Date: ____________ Movements: ____________ Start time: ____________ Elizebeth Koller time: ____________  Date: ____________ Movements: ____________ Start time: ____________ Elizebeth Koller time:  ____________ Date: ____________ Movements: ____________ Start time: ____________ Elizebeth Koller time: ____________ Date: ____________ Movements: ____________ Start time: ____________ Elizebeth Koller time: ____________ Date: ____________ Movements: ____________ Start time: ____________ Elizebeth Koller time: ____________ Date: ____________ Movements: ____________ Start time: ____________ Elizebeth Koller time: ____________ Date: ____________ Movements: ____________ Start time: ____________ Elizebeth Koller time: ____________ Date: ____________ Movements: ____________ Start time: ____________ Elizebeth Koller time: ____________  Date: ____________ Movements: ____________ Start time: ____________ Elizebeth Koller time: ____________ Date: ____________ Movements: ____________ Start time: ____________ Elizebeth Koller time: ____________ Date: ____________ Movements: ____________ Start time: ____________ Elizebeth Koller time: ____________ Date: ____________ Movements: ____________ Start time: ____________ Elizebeth Koller time: ____________ Date: ____________ Movements: ____________ Start time: ____________ Elizebeth Koller time: ____________ Date: ____________ Movements: ____________ Start time: ____________ Elizebeth Koller time: ____________ Date: ____________ Movements: ____________ Start time: ____________ Elizebeth Koller time: ____________  Date: ____________ Movements: ____________ Start time: ____________ Elizebeth Koller time: ____________ Date: ____________ Movements:  ____________ Start time: ____________ Elizebeth Koller time: ____________ Date: ____________ Movements: ____________ Start time: ____________ Elizebeth Koller time: ____________ Date: ____________ Movements: ____________ Start time: ____________ Elizebeth Koller time: ____________ Date: ____________ Movements: ____________ Start time: ____________ Elizebeth Koller time: ____________ Date: ____________ Movements: ____________ Start time: ____________ Elizebeth Koller time: ____________ Date: ____________ Movements: ____________ Start time: ____________ Elizebeth Koller time: ____________  Date: ____________ Movements: ____________ Start time: ____________ Elizebeth Koller time: ____________ Date: ____________ Movements: ____________ Start time: ____________ Elizebeth Koller time: ____________ Date: ____________ Movements: ____________ Start time: ____________ Elizebeth Koller time: ____________ Date: ____________ Movements: ____________ Start time: ____________ Elizebeth Koller time: ____________ Date: ____________ Movements: ____________ Start time: ____________ Elizebeth Koller time: ____________ Date: ____________ Movements: ____________ Start time: ____________ Elizebeth Koller time: ____________ Document Released: 11/07/2006 Document Revised: 09/24/2012 Document Reviewed: 08/04/2012 [employer] Patient Information 2015 [employer]. This information is not intended to replace advice given to you by your health care provider. Make sure you discuss any questions you have with your health care provider.

## 2014-05-05 LAB — OB RESULTS CONSOLE GBS: STREP GROUP B AG: NEGATIVE

## 2014-05-07 NOTE — Discharge Summary (Signed)
  Pt is a 33 y/o white female. [id number] who was admitted by Dr. Philis Pique for a non reassuring NST, normal BPP, and preterm contractions. She was admitted and observed. She had a MFM consult. Her NST was reactive and her repeat BPP was normal. MFM advised that baby and mother were doing well. Advised to discharge to home. Will follow up in office.

## 2014-05-20 ENCOUNTER — Encounter (HOSPITAL_COMMUNITY): Payer: Self-pay | Admitting: Anesthesiology

## 2014-05-20 ENCOUNTER — Encounter (HOSPITAL_COMMUNITY): Payer: Medicaid Other | Admitting: Anesthesiology

## 2014-05-20 ENCOUNTER — Inpatient Hospital Stay (HOSPITAL_COMMUNITY): Payer: Medicaid Other | Admitting: Anesthesiology

## 2014-05-20 ENCOUNTER — Inpatient Hospital Stay (HOSPITAL_COMMUNITY)
Admission: AD | Admit: 2014-05-20 | Discharge: 2014-05-22 | DRG: 775 | Disposition: A | Discharge status: Home / Self Care | Payer: Medicaid Other | Source: Ambulatory Visit | Attending: Obstetrics and Gynecology | Admitting: Obstetrics and Gynecology

## 2014-05-20 DIAGNOSIS — K219 Gastro-esophageal reflux disease without esophagitis: Secondary | ICD-10-CM | POA: Diagnosis present

## 2014-05-20 DIAGNOSIS — O99214 Obesity complicating childbirth: Secondary | ICD-10-CM

## 2014-05-20 DIAGNOSIS — O4100X0 Oligohydramnios, unspecified trimester, not applicable or unspecified: Secondary | ICD-10-CM | POA: Diagnosis present

## 2014-05-20 DIAGNOSIS — Z978 Presence of other specified devices: Secondary | ICD-10-CM | POA: Diagnosis not present

## 2014-05-20 DIAGNOSIS — E669 Obesity, unspecified: Secondary | ICD-10-CM | POA: Diagnosis present

## 2014-05-20 DIAGNOSIS — Z6839 Body mass index (BMI) 39.0-39.9, adult: Secondary | ICD-10-CM | POA: Diagnosis not present

## 2014-05-20 LAB — TYPE AND SCREEN
ABO/RH(D): A POS
ANTIBODY SCREEN: NEGATIVE

## 2014-05-20 LAB — CBC
HCT: 35.3 % — ABNORMAL LOW (ref 36.0–46.0)
Hemoglobin: 12 g/dL (ref 12.0–15.0)
MCH: 30.3 pg (ref 26.0–34.0)
MCHC: 34 g/dL (ref 30.0–36.0)
MCV: 89.1 fL (ref 78.0–100.0)
PLATELETS: 294 10*3/uL (ref 150–400)
RBC: 3.96 MIL/uL (ref 3.87–5.11)
RDW: 14.5 % (ref 11.5–15.5)
WBC: 12.4 10*3/uL — ABNORMAL HIGH (ref 4.0–10.5)

## 2014-05-20 LAB — RPR

## 2014-05-20 LAB — ABO/RH: ABO/RH(D): A POS

## 2014-05-20 MED ORDER — OXYTOCIN 40 UNITS IN LACTATED RINGERS INFUSION - SIMPLE MED
62.5000 mL/h | INTRAVENOUS | Status: DC
  Filled 2014-05-20: qty 1000

## 2014-05-20 MED ORDER — ZOLPIDEM TARTRATE 5 MG PO TABS: 5.0000 mg | ORAL_TABLET | Freq: Every evening | ORAL | Status: DC | PRN

## 2014-05-20 MED ORDER — LACTATED RINGERS IV SOLN
INTRAVENOUS | Status: DC
  Administered 2014-05-20: 15:00:00 via INTRAVENOUS

## 2014-05-20 MED ORDER — EPHEDRINE 5 MG/ML INJ
10.0000 mg | INTRAVENOUS | Status: DC | PRN
  Filled 2014-05-20: qty 2

## 2014-05-20 MED ORDER — DIPHENHYDRAMINE HCL 50 MG/ML IJ SOLN
12.5000 mg | INTRAMUSCULAR | Status: DC | PRN
  Administered 2014-05-21: 12.5 mg via INTRAVENOUS
  Filled 2014-05-20: qty 1

## 2014-05-20 MED ORDER — PHENYLEPHRINE 40 MCG/ML (10ML) SYRINGE FOR IV PUSH (FOR BLOOD PRESSURE SUPPORT)
80.0000 ug | PREFILLED_SYRINGE | INTRAVENOUS | Status: DC | PRN
  Filled 2014-05-20: qty 2

## 2014-05-20 MED ORDER — PHENYLEPHRINE 40 MCG/ML (10ML) SYRINGE FOR IV PUSH (FOR BLOOD PRESSURE SUPPORT)
80.0000 ug | PREFILLED_SYRINGE | INTRAVENOUS | Status: DC | PRN
  Filled 2014-05-20: qty 2
  Filled 2014-05-20: qty 10

## 2014-05-20 MED ORDER — OXYTOCIN BOLUS FROM INFUSION: 500.0000 mL | INTRAVENOUS | Status: DC

## 2014-05-20 MED ORDER — LIDOCAINE HCL (PF) 1 % IJ SOLN
30.0000 mL | INTRAMUSCULAR | Status: DC | PRN
  Filled 2014-05-20: qty 30

## 2014-05-20 MED ORDER — LACTATED RINGERS IV SOLN
500.0000 mL | INTRAVENOUS | Status: DC | PRN
  Administered 2014-05-21: 500 mL via INTRAVENOUS

## 2014-05-20 MED ORDER — LIDOCAINE HCL (PF) 1 % IJ SOLN
INTRAMUSCULAR | Status: DC | PRN
  Administered 2014-05-20 (×2): 4 mL

## 2014-05-20 MED ORDER — LACTATED RINGERS IV SOLN
500.0000 mL | Freq: Once | INTRAVENOUS | Status: AC
  Administered 2014-05-20: 500 mL via INTRAVENOUS

## 2014-05-20 MED ORDER — FENTANYL 2.5 MCG/ML BUPIVACAINE 1/10 % EPIDURAL INFUSION (WH - ANES)
15.0000 mL/h | INTRAMUSCULAR | Status: DC | PRN
  Administered 2014-05-20: 15 mL/h via EPIDURAL
  Administered 2014-05-21: 14 mL/h via EPIDURAL
  Filled 2014-05-20 (×3): qty 125

## 2014-05-20 MED ORDER — ONDANSETRON HCL 4 MG/2ML IJ SOLN: 4.0000 mg | Freq: Four times a day (QID) | INTRAMUSCULAR | Status: DC | PRN

## 2014-05-20 MED ORDER — FENTANYL 2.5 MCG/ML BUPIVACAINE 1/10 % EPIDURAL INFUSION (WH - ANES)
INTRAMUSCULAR | Status: DC | PRN
Start: 2014-05-20 — End: 2014-05-21
  Administered 2014-05-20: 14 mL/h via EPIDURAL
  Administered 2014-05-21: 02:00:00

## 2014-05-20 MED ORDER — TERBUTALINE SULFATE 1 MG/ML IJ SOLN: 0.2500 mg | Freq: Once | INTRAMUSCULAR | Status: AC | PRN

## 2014-05-20 MED ORDER — CITRIC ACID-SODIUM CITRATE 334-500 MG/5ML PO SOLN: 30.0000 mL | ORAL | Status: DC | PRN

## 2014-05-20 MED ORDER — ACETAMINOPHEN 325 MG PO TABS: 650.0000 mg | ORAL_TABLET | ORAL | Status: DC | PRN

## 2014-05-20 MED ORDER — OXYTOCIN 40 UNITS IN LACTATED RINGERS INFUSION - SIMPLE MED
1.0000 m[IU]/min | INTRAVENOUS | Status: DC
  Administered 2014-05-20: 2 m[IU]/min via INTRAVENOUS

## 2014-05-20 MED ORDER — OXYCODONE-ACETAMINOPHEN 5-325 MG PO TABS: 1.0000 | ORAL_TABLET | ORAL | Status: DC | PRN

## 2014-05-20 MED ORDER — IBUPROFEN 600 MG PO TABS: 600.0000 mg | ORAL_TABLET | Freq: Four times a day (QID) | ORAL | Status: DC | PRN

## 2014-05-20 MED ORDER — BUTORPHANOL TARTRATE 1 MG/ML IJ SOLN: 1.0000 mg | INTRAMUSCULAR | Status: DC | PRN

## 2014-05-20 NOTE — Anesthesia Procedure Notes (Signed)
Epidural Patient location during procedure: OB Start time: 05/20/2014 8:14 PM  Preanesthetic Checklist Completed: patient identified, site marked, surgical consent, pre-op evaluation, timeout performed, IV checked, risks and benefits discussed and monitors and equipment checked  Epidural Patient position: sitting Prep: site prepped and draped and DuraPrep Patient monitoring: continuous pulse ox and blood pressure Approach: midline Location: L3-L4 Injection technique: LOR air  Needle:  Needle type: Tuohy  Needle gauge: 17 G Needle length: 9 cm and 9 Needle insertion depth: 7 cm Catheter type: closed end flexible Catheter size: 19 Gauge Catheter at skin depth: 12 cm Test dose: negative and Other  Assessment Events: blood not aspirated, injection not painful, no injection resistance, negative IV test and no paresthesia  Additional Notes Patient identified. Risks and benefits discussed including failed block, incomplete  Pain control, post dural puncture headache, nerve damage, paralysis, blood pressure Changes, nausea, vomiting, reactions to medications-both toxic and allergic and post Partum back pain. All questions were answered. Patient expressed understanding and wished to proceed. Sterile technique was used throughout procedure. Epidural site was Dressed with sterile barrier dressing. No paresthesias, signs of intravascular injection Or signs of intrathecal spread were encountered.  Patient was more comfortable after the epidural was dosed. Please see RN's note for documentation of vital signs and FHR which are stable.

## 2014-05-20 NOTE — Anesthesia Preprocedure Evaluation (Signed)
Anesthesia Evaluation  Patient identified by MRN, date of birth, ID band Patient awake    Reviewed: Allergy & Precautions, H&P , Patient's Chart, lab work & pertinent test results  Airway Mallampati: III TM Distance: >3 FB Neck ROM: Full    Dental no notable dental hx. (+) Teeth Intact   Pulmonary neg pulmonary ROS,  breath sounds clear to auscultation  Pulmonary exam normal       Cardiovascular negative cardio ROS  Rhythm:Regular Rate:Normal     Neuro/Psych negative neurological ROS  negative psych ROS   GI/Hepatic Neg liver ROS, GERD-  Medicated and Controlled,  Endo/Other  negative endocrine ROSMorbid obesity  Renal/GU negative Renal ROS  negative genitourinary   Musculoskeletal negative musculoskeletal ROS (+)   Abdominal (+) + obese,   Peds  Hematology negative hematology ROS (+)   Anesthesia Other Findings   Reproductive/Obstetrics (+) Pregnancy                           Anesthesia Physical Anesthesia Plan  ASA: III  Anesthesia Plan: Epidural   Post-op Pain Management:    Induction:   Airway Management Planned: Natural Airway  Additional Equipment:   Intra-op Plan:   Post-operative Plan:   Informed Consent: I have reviewed the patients History and Physical, chart, labs and discussed the procedure including the risks, benefits and alternatives for the proposed anesthesia with the patient or authorized representative who has indicated his/her understanding and acceptance.     Plan Discussed with: Anesthesiologist  Anesthesia Plan Comments:         Anesthesia Quick Evaluation

## 2014-05-20 NOTE — Progress Notes (Signed)
Patient c/o of itchiness at this time; offered patient IV benadryl but she is refusing at this time

## 2014-05-21 ENCOUNTER — Encounter (HOSPITAL_COMMUNITY): Payer: Self-pay | Admitting: *Deleted

## 2014-05-21 MED ORDER — METHYLERGONOVINE MALEATE 0.2 MG PO TABS: 0.2000 mg | ORAL_TABLET | ORAL | Status: DC | PRN

## 2014-05-21 MED ORDER — PRENATAL MULTIVITAMIN CH
1.0000 | ORAL_TABLET | Freq: Every day | ORAL | Status: DC
  Administered 2014-05-21: 1 via ORAL
  Filled 2014-05-21: qty 1

## 2014-05-21 MED ORDER — SENNOSIDES-DOCUSATE SODIUM 8.6-50 MG PO TABS
2.0000 | ORAL_TABLET | ORAL | Status: DC
  Administered 2014-05-22: 2 via ORAL
  Filled 2014-05-21: qty 2

## 2014-05-21 MED ORDER — SIMETHICONE 80 MG PO CHEW: 80.0000 mg | CHEWABLE_TABLET | ORAL | Status: DC | PRN

## 2014-05-21 MED ORDER — SODIUM CHLORIDE 0.9 % IJ SOLN: 3.0000 mL | INTRAMUSCULAR | Status: DC | PRN

## 2014-05-21 MED ORDER — FERROUS SULFATE 325 (65 FE) MG PO TABS
325.0000 mg | ORAL_TABLET | Freq: Two times a day (BID) | ORAL | Status: DC
  Administered 2014-05-21 – 2014-05-22 (×2): 325 mg via ORAL
  Filled 2014-05-21 (×2): qty 1

## 2014-05-21 MED ORDER — MAGNESIUM HYDROXIDE 400 MG/5ML PO SUSP: 30.0000 mL | ORAL | Status: DC | PRN

## 2014-05-21 MED ORDER — ONDANSETRON HCL 4 MG/2ML IJ SOLN: 4.0000 mg | INTRAMUSCULAR | Status: DC | PRN

## 2014-05-21 MED ORDER — SODIUM CHLORIDE 0.9 % IJ SOLN: 3.0000 mL | Freq: Two times a day (BID) | INTRAMUSCULAR | Status: DC

## 2014-05-21 MED ORDER — ONDANSETRON HCL 4 MG PO TABS: 4.0000 mg | ORAL_TABLET | ORAL | Status: DC | PRN

## 2014-05-21 MED ORDER — OXYCODONE-ACETAMINOPHEN 5-325 MG PO TABS: 1.0000 | ORAL_TABLET | ORAL | Status: DC | PRN

## 2014-05-21 MED ORDER — TETANUS-DIPHTH-ACELL PERTUSSIS 5-2.5-18.5 LF-MCG/0.5 IM SUSP
0.5000 mL | Freq: Once | INTRAMUSCULAR | Status: AC
Start: 2014-05-22 — End: 2014-05-22
  Administered 2014-05-22: 0.5 mL via INTRAMUSCULAR

## 2014-05-21 MED ORDER — LANOLIN HYDROUS EX OINT: TOPICAL_OINTMENT | CUTANEOUS | Status: DC | PRN

## 2014-05-21 MED ORDER — MEASLES, MUMPS & RUBELLA VAC ~~LOC~~ INJ
0.5000 mL | INJECTION | Freq: Once | SUBCUTANEOUS | Status: DC
  Filled 2014-05-21: qty 0.5

## 2014-05-21 MED ORDER — METHYLERGONOVINE MALEATE 0.2 MG/ML IJ SOLN: 0.2000 mg | INTRAMUSCULAR | Status: DC | PRN

## 2014-05-21 MED ORDER — WITCH HAZEL-GLYCERIN EX PADS: 1.0000 "application " | MEDICATED_PAD | CUTANEOUS | Status: DC | PRN

## 2014-05-21 MED ORDER — ZOLPIDEM TARTRATE 5 MG PO TABS
5.0000 mg | ORAL_TABLET | Freq: Every evening | ORAL | Status: DC | PRN
Start: 2014-05-21 — End: 2014-05-22

## 2014-05-21 MED ORDER — BENZOCAINE-MENTHOL 20-0.5 % EX AERO: 1.0000 "application " | INHALATION_SPRAY | CUTANEOUS | Status: DC | PRN

## 2014-05-21 MED ORDER — SODIUM CHLORIDE 0.9 % IV SOLN
250.0000 mL | INTRAVENOUS | Status: DC | PRN
Start: 2014-05-21 — End: 2014-05-22

## 2014-05-21 MED ORDER — DIBUCAINE 1 % RE OINT
1.0000 | TOPICAL_OINTMENT | RECTAL | Status: DC | PRN
Start: 2014-05-21 — End: 2014-05-22

## 2014-05-21 MED ORDER — DIPHENHYDRAMINE HCL 25 MG PO CAPS: 25.0000 mg | ORAL_CAPSULE | Freq: Four times a day (QID) | ORAL | Status: DC | PRN

## 2014-05-21 MED ORDER — IBUPROFEN 800 MG PO TABS
800.0000 mg | ORAL_TABLET | Freq: Three times a day (TID) | ORAL | Status: DC
  Administered 2014-05-21 – 2014-05-22 (×4): 800 mg via ORAL
  Filled 2014-05-21 (×4): qty 1

## 2014-05-21 NOTE — Lactation Note (Signed)
This note was copied from the chart of Kelly Buck. Lactation Consultation Note  P4, Mother breastfed last 2 children for 1 1/2 weeks-2 weeks.  Mother states her infant was losing weight and was told to stop breastfeeding and give baby formula, 12 years ago. 33 year old had a meconium plug and stopped breastfeeding at 1 1/2 weeks. Mother has inframamary breast augmentation behind her muscle.  Mother has small breasts.  Unable to ask due to relatives in the room the reason for implants. Explained to mother it would beneficial if she started post pumping to stimulate her milk supply. Set up DEBP, reviewed breastmilk storage and cleaning, cluster feeding.  Mom encouraged to feed baby 8-12 times/24 hours and with feeding cues.  Reviewed foley cup feeding. Suggested mother post pump 4-6 times a day for 15-20 min. Give baby back volume pumped at next feeding. Mother states she is unable to get baby to latch on right breast.  Reviewed hand expression.  Mother had good drops of colostrum and had mother use hand pump to evert nipple more. Infant latched easily.  Sucks and some swallows observed. Encouraged breast compression to get baby on deeper since nipple was pinched after feeding.  Suggested mother maintain compression or massage during feeding to keep infant active at the breast. Reviewed that  mother can call RN tonight for further instruction. Mom made aware of O/P services, breastfeeding support groups, community resources, and our phone # for post-discharge questions.      Patient Name: Kelly Maylen Waltermire [id number] Date: 05/21/2014 Reason for consult: Initial assessment   Maternal Data Has patient been taught Hand Expression?: Yes Does the patient have breastfeeding experience prior to this delivery?: Yes  Feeding Feeding Type: Breast Fed Length of feed: 10 min  LATCH Score/Interventions Latch: Grasps breast easily, tongue down, lips flanged, rhythmical  sucking. Intervention(s): Adjust position;Assist with latch;Breast massage;Breast compression  Audible Swallowing: A few with stimulation Intervention(s): Skin to skin;Hand expression  Type of Nipple: Everted at rest and after stimulation  Comfort (Breast/Nipple): Soft / non-tender     Hold (Positioning): Assistance needed to correctly position infant at breast and maintain latch.  LATCH Score: 8  Lactation Tools Discussed/Used     Consult Status Consult Status: Follow-up Date: 05/22/14 Follow-up type: In-patient    Vivianne Master [facility] 05/21/2014, 8:30 PM

## 2014-05-21 NOTE — Anesthesia Postprocedure Evaluation (Signed)
  Anesthesia Post-op Note  Anesthesia Post Note  Patient: Kelly Buck  Procedure(s) Performed: * No procedures listed *  Anesthesia type: Epidural  Patient location: Mother/Baby  Post pain: Pain level controlled  Post assessment: Post-op Vital signs reviewed  Last Vitals:  Filed Vitals:   05/21/14 1105  BP: 128/81  Pulse: 95  Temp: 36.7 C  Resp: 20    Post vital signs: Reviewed  Level of consciousness:alert  Complications: No apparent anesthesia complications

## 2014-05-21 NOTE — H&P (Addendum)
Kelly Buck is a 33 y.o. female presenting for Induction of labor for oligohydramnios  33 year old G 73 para 63 Caucasian female presents at 79 week 2 days estimated gestational age for induction of labor for oligohydramnios. The patient was seen in the office earlier today for a problem OB visit for the chief complaint of decreased fetal movement. A nonstress test was performed which was reactive. The patient was having some irregular contractions on the monitor. The patient continued to complain of inability to feel fetal movement despite the reassuring nonstress test  And an ultrasound was performed. During this ultrasound oligohydramnios was visualized. No 2 x 2 pockets of amniotic fluid could be identified. Given this abnormal ultrasound finding the decision was made  To proceed with induction of labor. Risks, benefits, alternatives of induction of labor were discussed with the patient at length and she wished to proceed   History OB History   Grav Para Term Preterm Abortions TAB SAB Ect Mult Living   5 3 3  1  1   3      Past Medical History  Diagnosis Date  . Medical history non-contributory    Past Surgical History  Procedure Laterality Date  . Placement of breast implants    . Carpal tunnel release    . Tummy tuck     Family History: family history is not on file. Social History:  reports that she has never smoked. She does not have any smokeless tobacco history on file. She reports that she does not drink alcohol or use illicit drugs.   Prenatal Transfer Tool  Maternal Diabetes: No Genetic Screening: Normal Maternal Ultrasounds/Referrals: Normal Fetal Ultrasounds or other Referrals:  None Maternal Substance Abuse:  No Significant Maternal Medications:  None Significant Maternal Lab Results:  None Other Comments:  None  ROS: as above  Dilation: 5 Effacement (%): 70 Station: -2 Exam by:: Dr. Harrington Challenger Blood pressure 132/81, pulse 90, temperature 98.4 F (36.9 C),  temperature source Oral, resp. rate 18, last menstrual period 08/26/2013, SpO2 100.00%. Exam Physical Exam  Prenatal labs: ABO, Rh: --/--/A POS, A POS (07/30 1500) Antibody: NEG (07/30 1500) Rubella: Immune (01/30 0000) RPR: NON REAC (07/30 1500)  HBsAg: Negative (01/30 0000)  HIV: Non-reactive (01/30 0000)  GBS: Negative (07/15 0000)   Assessment/Plan: 1) Admit for IOL 2) AROM/Pitocin 3) Epidural on request 4) Patient desires PP BTL, history of prior abdominoplasty, tubal consent signed on 03/03/2014  Kelly Buck H. 05/21/2014, 1:20 AM

## 2014-05-22 LAB — CBC
HCT: 32.4 % — ABNORMAL LOW (ref 36.0–46.0)
HEMOGLOBIN: 11 g/dL — AB (ref 12.0–15.0)
MCH: 30.5 pg (ref 26.0–34.0)
MCHC: 34 g/dL (ref 30.0–36.0)
MCV: 89.8 fL (ref 78.0–100.0)
Platelets: 231 10*3/uL (ref 150–400)
RBC: 3.61 MIL/uL — ABNORMAL LOW (ref 3.87–5.11)
RDW: 14.8 % (ref 11.5–15.5)
WBC: 14.1 10*3/uL — AB (ref 4.0–10.5)

## 2014-05-22 NOTE — Progress Notes (Signed)
Patient is eating, ambulating, voiding.  Pain control is good.  Filed Vitals:   05/21/14 1105 05/21/14 1510 05/22/14 0000 05/22/14 0559  BP: 128/81 127/74 128/72 110/63  Pulse: 95 79 87 79  Temp: 98.1 F (36.7 C) 98 F (36.7 C) 97.7 F (36.5 C) 98 F (36.7 C)  TempSrc: Axillary Oral Oral Oral  Resp: 20 18 20 18   SpO2: 97% 98% 98%     Fundus firm Perineum without swelling.  Lab Results  Component Value Date   WBC 14.1* 05/22/2014   HGB 11.0* 05/22/2014   HCT 32.4* 05/22/2014   MCV 89.8 05/22/2014   PLT 231 05/22/2014    --/--/A POS, A POS (07/30 1500)/RI  A/P Post partum day 1.  Routine care.  Expect d/c today.    Milisa Kimbell A

## 2014-05-22 NOTE — Discharge Summary (Signed)
Obstetric Discharge Summary Reason for Admission: induction of labor Prenatal Procedures: ultrasound Intrapartum Procedures: spontaneous vaginal delivery Postpartum Procedures: none Complications-Operative and Postpartum: first degree perineal laceration Hemoglobin  Date Value Ref Range Status  05/22/2014 11.0* 12.0 - 15.0 g/dL Final     HCT  Date Value Ref Range Status  05/22/2014 32.4* 36.0 - 46.0 % Final    Discharge Diagnoses: Term Pregnancy-delivered and oligohydramnios  Discharge Information: Date: 05/22/2014 Activity: pelvic rest Diet: routine Medications: Ibuprofen Condition: stable Instructions: refer to practice specific booklet Discharge to: home Follow-up Information   Follow up with Darnel Mchan A, MD.   Specialty:  Obstetrics and Gynecology   Contact information:   [place] [address] [phone]       Newborn Data: Live born female  Birth Weight: 7 lb 4.2 oz (3294 g) APGAR: 9, 9  Home with mother.  Geovanie Winnett A 05/22/2014, 9:40 AM

## 2014-06-01 ENCOUNTER — Inpatient Hospital Stay (HOSPITAL_COMMUNITY): Payer: Medicaid Other

## 2014-06-01 ENCOUNTER — Observation Stay (HOSPITAL_COMMUNITY)
Admission: AD | Admit: 2014-06-01 | Discharge: 2014-06-02 | Disposition: A | Discharge status: Home / Self Care | Payer: Medicaid Other | Source: Ambulatory Visit | Attending: Obstetrics and Gynecology | Admitting: Obstetrics and Gynecology

## 2014-06-01 ENCOUNTER — Encounter (HOSPITAL_COMMUNITY): Payer: Medicaid Other | Admitting: Anesthesiology

## 2014-06-01 ENCOUNTER — Encounter (HOSPITAL_COMMUNITY): Payer: Self-pay | Admitting: *Deleted

## 2014-06-01 ENCOUNTER — Observation Stay (HOSPITAL_COMMUNITY): Payer: Medicaid Other | Admitting: Anesthesiology

## 2014-06-01 ENCOUNTER — Encounter (HOSPITAL_COMMUNITY)
Admission: AD | Disposition: A | Discharge status: Home / Self Care | Payer: Self-pay | Source: Ambulatory Visit | Attending: Obstetrics and Gynecology

## 2014-06-01 DIAGNOSIS — E669 Obesity, unspecified: Secondary | ICD-10-CM | POA: Diagnosis not present

## 2014-06-01 DIAGNOSIS — O8612 Endometritis following delivery: Secondary | ICD-10-CM | POA: Insufficient documentation

## 2014-06-01 DIAGNOSIS — K5900 Constipation, unspecified: Secondary | ICD-10-CM | POA: Diagnosis not present

## 2014-06-01 DIAGNOSIS — Z6839 Body mass index (BMI) 39.0-39.9, adult: Secondary | ICD-10-CM | POA: Insufficient documentation

## 2014-06-01 HISTORY — PX: DILATION AND EVACUATION: SHX1459

## 2014-06-01 LAB — URINALYSIS, ROUTINE W REFLEX MICROSCOPIC
BILIRUBIN URINE: NEGATIVE
BILIRUBIN URINE: NEGATIVE
GLUCOSE, UA: NEGATIVE mg/dL
Glucose, UA: NEGATIVE mg/dL
HGB URINE DIPSTICK: NEGATIVE
KETONES UR: NEGATIVE mg/dL
Ketones, ur: NEGATIVE mg/dL
Leukocytes, UA: NEGATIVE
NITRITE: NEGATIVE
Nitrite: NEGATIVE
PH: 7.5 (ref 5.0–8.0)
Protein, ur: NEGATIVE mg/dL
Protein, ur: NEGATIVE mg/dL
SPECIFIC GRAVITY, URINE: 1.01 (ref 1.005–1.030)
SPECIFIC GRAVITY, URINE: 1.01 (ref 1.005–1.030)
UROBILINOGEN UA: 0.2 mg/dL (ref 0.0–1.0)
Urobilinogen, UA: 0.2 mg/dL (ref 0.0–1.0)
pH: 8 (ref 5.0–8.0)

## 2014-06-01 LAB — URINE MICROSCOPIC-ADD ON

## 2014-06-01 LAB — CBC
HCT: 37.4 % (ref 36.0–46.0)
Hemoglobin: 12.6 g/dL (ref 12.0–15.0)
MCH: 30.2 pg (ref 26.0–34.0)
MCHC: 33.7 g/dL (ref 30.0–36.0)
MCV: 89.7 fL (ref 78.0–100.0)
PLATELETS: 408 10*3/uL — AB (ref 150–400)
RBC: 4.17 MIL/uL (ref 3.87–5.11)
RDW: 13.9 % (ref 11.5–15.5)
WBC: 12.9 10*3/uL — AB (ref 4.0–10.5)

## 2014-06-01 LAB — SURGICAL PCR SCREEN
MRSA, PCR: NEGATIVE
STAPHYLOCOCCUS AUREUS: NEGATIVE

## 2014-06-01 SURGERY — DILATION AND EVACUATION, UTERUS
Anesthesia: Monitor Anesthesia Care | Site: Vagina

## 2014-06-01 MED ORDER — FENTANYL CITRATE 0.05 MG/ML IJ SOLN
INTRAMUSCULAR | Status: AC
  Filled 2014-06-01: qty 2

## 2014-06-01 MED ORDER — PROPOFOL 10 MG/ML IV EMUL
INTRAVENOUS | Status: AC
  Filled 2014-06-01: qty 40

## 2014-06-01 MED ORDER — OXYTOCIN 10 UNIT/ML IJ SOLN
INTRAMUSCULAR | Status: AC
  Filled 2014-06-01: qty 4

## 2014-06-01 MED ORDER — DEXAMETHASONE SODIUM PHOSPHATE 10 MG/ML IJ SOLN
INTRAMUSCULAR | Status: DC | PRN
  Administered 2014-06-01: 4 mg via INTRAVENOUS

## 2014-06-01 MED ORDER — METHYLERGONOVINE MALEATE 0.2 MG/ML IJ SOLN
INTRAMUSCULAR | Status: DC | PRN
  Administered 2014-06-01: 0.2 mg via INTRAMUSCULAR

## 2014-06-01 MED ORDER — MIDAZOLAM HCL 2 MG/2ML IJ SOLN
INTRAMUSCULAR | Status: DC | PRN
  Administered 2014-06-01: 2 mg via INTRAVENOUS

## 2014-06-01 MED ORDER — MIDAZOLAM HCL 2 MG/2ML IJ SOLN
INTRAMUSCULAR | Status: AC
  Filled 2014-06-01: qty 2

## 2014-06-01 MED ORDER — IBUPROFEN 600 MG PO TABS
600.0000 mg | ORAL_TABLET | Freq: Once | ORAL | Status: AC
  Administered 2014-06-01: 600 mg via ORAL
  Filled 2014-06-01: qty 1

## 2014-06-01 MED ORDER — IBUPROFEN 800 MG PO TABS
800.0000 mg | ORAL_TABLET | Freq: Three times a day (TID) | ORAL | Status: DC | PRN
  Filled 2014-06-01: qty 1

## 2014-06-01 MED ORDER — LACTATED RINGERS IV SOLN
INTRAVENOUS | Status: DC
  Administered 2014-06-01 – 2014-06-02 (×5): via INTRAVENOUS

## 2014-06-01 MED ORDER — SCOPOLAMINE 1 MG/3DAYS TD PT72
1.0000 | MEDICATED_PATCH | Freq: Once | TRANSDERMAL | Status: AC | PRN
  Administered 2014-06-01: 1.5 mg via TRANSDERMAL

## 2014-06-01 MED ORDER — FENTANYL CITRATE 0.05 MG/ML IJ SOLN: 25.0000 ug | INTRAMUSCULAR | Status: DC | PRN

## 2014-06-01 MED ORDER — KETOROLAC TROMETHAMINE 30 MG/ML IJ SOLN
30.0000 mg | Freq: Once | INTRAMUSCULAR | Status: DC
  Filled 2014-06-01: qty 1

## 2014-06-01 MED ORDER — SCOPOLAMINE 1 MG/3DAYS TD PT72
MEDICATED_PATCH | TRANSDERMAL | Status: AC
  Filled 2014-06-01: qty 1

## 2014-06-01 MED ORDER — EPHEDRINE 5 MG/ML INJ
INTRAVENOUS | Status: AC
  Filled 2014-06-01: qty 10

## 2014-06-01 MED ORDER — METHYLERGONOVINE MALEATE 0.2 MG/ML IJ SOLN
INTRAMUSCULAR | Status: AC
  Filled 2014-06-01: qty 1

## 2014-06-01 MED ORDER — ONDANSETRON HCL 4 MG/2ML IJ SOLN
INTRAMUSCULAR | Status: AC
  Filled 2014-06-01: qty 2

## 2014-06-01 MED ORDER — ONDANSETRON HCL 4 MG/2ML IJ SOLN
INTRAMUSCULAR | Status: DC | PRN
  Administered 2014-06-01: 4 mg via INTRAVENOUS

## 2014-06-01 MED ORDER — PROPOFOL 10 MG/ML IV EMUL
INTRAVENOUS | Status: DC | PRN
  Administered 2014-06-01: 30 mg via INTRAVENOUS
  Administered 2014-06-01: 20 mg via INTRAVENOUS

## 2014-06-01 MED ORDER — MORPHINE SULFATE 0.5 MG/ML IJ SOLN
INTRAMUSCULAR | Status: AC
  Filled 2014-06-01: qty 10

## 2014-06-01 MED ORDER — LIDOCAINE IN DEXTROSE 5-7.5 % IV SOLN
INTRAVENOUS | Status: DC | PRN
  Administered 2014-06-01: 1 mL via INTRATHECAL

## 2014-06-01 MED ORDER — CEFAZOLIN SODIUM-DEXTROSE 2-3 GM-% IV SOLR
INTRAVENOUS | Status: DC | PRN
  Administered 2014-06-01: 2 g via INTRAVENOUS

## 2014-06-01 MED ORDER — MEPERIDINE HCL 25 MG/ML IJ SOLN: 6.2500 mg | INTRAMUSCULAR | Status: DC | PRN

## 2014-06-01 MED ORDER — CEFAZOLIN SODIUM-DEXTROSE 2-3 GM-% IV SOLR
INTRAVENOUS | Status: AC
  Filled 2014-06-01: qty 50

## 2014-06-01 MED ORDER — KETOROLAC TROMETHAMINE 30 MG/ML IJ SOLN
INTRAMUSCULAR | Status: AC
  Filled 2014-06-01: qty 1

## 2014-06-01 MED ORDER — LIDOCAINE HCL (CARDIAC) 20 MG/ML IV SOLN
INTRAVENOUS | Status: DC | PRN
  Administered 2014-06-01: 20 mg via INTRAVENOUS

## 2014-06-01 MED ORDER — DEXTROSE 5 % IN LACTATED RINGERS IV BOLUS
1000.0000 mL | Freq: Once | INTRAVENOUS | Status: AC
  Administered 2014-06-01: 1000 mL via INTRAVENOUS

## 2014-06-01 MED ORDER — PROMETHAZINE HCL 25 MG/ML IJ SOLN: 6.2500 mg | INTRAMUSCULAR | Status: DC | PRN

## 2014-06-01 MED ORDER — NALBUPHINE HCL 10 MG/ML IJ SOLN: 10.0000 mg | INTRAMUSCULAR | Status: DC | PRN

## 2014-06-01 MED ORDER — DEXAMETHASONE SODIUM PHOSPHATE 4 MG/ML IJ SOLN
INTRAMUSCULAR | Status: AC
  Filled 2014-06-01: qty 1

## 2014-06-01 MED ORDER — KETOROLAC TROMETHAMINE 30 MG/ML IJ SOLN: 15.0000 mg | Freq: Once | INTRAMUSCULAR | Status: DC | PRN

## 2014-06-01 MED ORDER — PHENYLEPHRINE HCL 10 MG/ML IJ SOLN
INTRAMUSCULAR | Status: AC
Start: 2014-06-01 — End: 2014-06-01
  Filled 2014-06-01: qty 1

## 2014-06-01 MED ORDER — PROPOFOL INFUSION 10 MG/ML OPTIME
INTRAVENOUS | Status: DC | PRN
  Administered 2014-06-01: 100 ug/kg/min via INTRAVENOUS

## 2014-06-01 SURGICAL SUPPLY — 20 items
ADAPTER VACURETTE TBG SET 14 (CANNULA) ×1 IMPLANT
CATH ROBINSON RED A/P 16FR (CATHETERS) ×2 IMPLANT
CLOTH BEACON ORANGE TIMEOUT ST (SAFETY) ×2 IMPLANT
DECANTER SPIKE VIAL GLASS SM (MISCELLANEOUS) ×2 IMPLANT
GLOVE BIO SURGEON STRL SZ7 (GLOVE) ×2 IMPLANT
GLOVE BIOGEL PI IND STRL 7.0 (GLOVE) ×1 IMPLANT
GLOVE BIOGEL PI INDICATOR 7.0 (GLOVE) ×1
GOWN STRL REUS W/TWL LRG LVL3 (GOWN DISPOSABLE) ×4 IMPLANT
KIT BERKELEY 1ST TRIMESTER 3/8 (MISCELLANEOUS) ×2 IMPLANT
NS IRRIG 1000ML POUR BTL (IV SOLUTION) ×2 IMPLANT
PACK VAGINAL MINOR WOMEN LF (CUSTOM PROCEDURE TRAY) ×2 IMPLANT
PAD OB MATERNITY 4.3X12.25 (PERSONAL CARE ITEMS) ×2 IMPLANT
PAD PREP 24X48 CUFFED NSTRL (MISCELLANEOUS) ×2 IMPLANT
SET BERKELEY SUCTION TUBING (SUCTIONS) ×2 IMPLANT
TOWEL OR 17X24 6PK STRL BLUE (TOWEL DISPOSABLE) ×4 IMPLANT
VACURETTE 10 RIGID CVD (CANNULA) IMPLANT
VACURETTE 14MM CVD 1/2 BASE (CANNULA) ×1 IMPLANT
VACURETTE 7MM CVD STRL WRAP (CANNULA) IMPLANT
VACURETTE 8 RIGID CVD (CANNULA) IMPLANT
VACURETTE 9 RIGID CVD (CANNULA) IMPLANT

## 2014-06-01 NOTE — MAU Provider Note (Signed)
Chief Complaint: Abdominal Pain and Vaginal Bleeding   First Provider Initiated Contact with Patient 06/01/14 1524     SUBJECTIVE HPI: Kelly Buck is a 33 y.o. [id number] at 11 days PP SVD who presents with abd pain and swelling and new onset of bright red bleeding x 2 days. Had minimal abd pain since delivery and bleeding had decreased to scant tan bleeding until two days ago. Taking Tylenol RTC x 2 day w/ minimal improvement in pain. Concerned that Tylenol may have decreaced her milk supply. Reports moderate BRB w/ small clots when up to the bathroom.with whiping, but small amount of breeding otherwise.   Past Medical History  Diagnosis Date  . Medical history non-contributory    OB History  Gravida Para Term Preterm AB SAB TAB Ectopic Multiple Living  5 4 4  1 1    4     # Outcome Date GA Lbr Len/2nd Weight Sex Delivery Anes PTL Lv  5 TRM 05/21/14 [redacted]w[redacted]d -10:59 / 00:11 3.294 kg (7 lb 4.2 oz) F SVD EPI  Y  4 SAB           3 TRM           2 TRM           1 TRM              Past Surgical History  Procedure Laterality Date  . Placement of breast implants    . Carpal tunnel release    . Tummy tuck      No current facility-administered medications on file prior to encounter.   Current Outpatient Prescriptions on File Prior to Encounter  Medication Sig Dispense Refill  . acetaminophen (TYLENOL) 500 MG tablet Take 1,000 mg by mouth every 6 (six) hours as needed for moderate pain.      . prenatal vitamin w/FE, FA (NATACHEW) 29-1 MG CHEW chewable tablet Chew 2 tablets by mouth daily at 12 noon.        No Known Allergies  ROS: Low abd pain, VB, night sweats (wonders if fever breaking) constipation (2 small BMs since delivery). Denies fever, chills N/V/D, urinary complaints, dizziness.  OBJECTIVE Blood pressure 129/75, pulse 78, temperature 98.3 F (36.8 C), temperature source Oral, resp. rate 20, last menstrual period 08/26/2013, currently breastfeeding. GENERAL: Well-developed,  well-nourished female in mild distress.  HEENT: Normocephalic HEART: normal rate RESP: normal effort ABDOMEN: Soft, severe low abd tenderness. ?fundus palpated @ 4/SP. Pos BS. No CVAT.  EXTREMITIES: Nontender, 1+ edema NEURO: Alert and oriented PELVIC EXAM: NEFG, Small blood noted on pad BIMANUAL: deferred  LAB RESULTS Results for orders placed during the hospital encounter of 06/01/14 (from the past 48 hour(s))  URINALYSIS, ROUTINE W REFLEX MICROSCOPIC     Status: Abnormal   Collection Time    06/01/14  2:10 PM      Result Value Ref Range   Color, Urine AMBER (*) YELLOW   Comment: BIOCHEMICALS MAY BE AFFECTED BY COLOR   APPearance HAZY (*) CLEAR   Specific Gravity, Urine 1.010  1.005 - 1.030   pH 7.5  5.0 - 8.0   Glucose, UA NEGATIVE  NEGATIVE mg/dL   Hgb urine dipstick LARGE (*) NEGATIVE   Bilirubin Urine NEGATIVE  NEGATIVE   Ketones, ur NEGATIVE  NEGATIVE mg/dL   Protein, ur NEGATIVE  NEGATIVE mg/dL   Urobilinogen, UA 0.2  0.0 - 1.0 mg/dL   Nitrite NEGATIVE  NEGATIVE   Leukocytes, UA LARGE (*) NEGATIVE  URINE MICROSCOPIC-ADD  ON     Status: Abnormal   Collection Time    06/01/14  2:10 PM      Result Value Ref Range   Squamous Epithelial / LPF FEW (*) RARE   WBC, UA 21-50  <3 WBC/hpf   RBC / HPF 0-2  <3 RBC/hpf   Bacteria, UA FEW (*) RARE  CBC     Status: Abnormal   Collection Time    06/01/14  2:40 PM      Result Value Ref Range   WBC 12.9 (*) 4.0 - 10.5 K/uL   Comment: REPEATED TO VERIFY     WHITE COUNT CONFIRMED ON SMEAR   RBC 4.17  3.87 - 5.11 MIL/uL   Hemoglobin 12.6  12.0 - 15.0 g/dL   HCT 37.4  36.0 - 46.0 %   MCV 89.7  78.0 - 100.0 fL   MCH 30.2  26.0 - 34.0 pg   MCHC 33.7  30.0 - 36.0 g/dL   RDW 13.9  11.5 - 15.5 %   Platelets 408 (*) 150 - 400 K/uL  URINALYSIS, ROUTINE W REFLEX MICROSCOPIC     Status: None   Collection Time    06/01/14  5:11 PM      Result Value Ref Range   Color, Urine YELLOW  YELLOW   APPearance CLEAR  CLEAR   Specific Gravity,  Urine 1.010  1.005 - 1.030   pH 8.0  5.0 - 8.0   Glucose, UA NEGATIVE  NEGATIVE mg/dL   Hgb urine dipstick NEGATIVE  NEGATIVE   Bilirubin Urine NEGATIVE  NEGATIVE   Ketones, ur NEGATIVE  NEGATIVE mg/dL   Protein, ur NEGATIVE  NEGATIVE mg/dL   Urobilinogen, UA 0.2  0.0 - 1.0 mg/dL   Nitrite NEGATIVE  NEGATIVE   Leukocytes, UA NEGATIVE  NEGATIVE   Comment: MICROSCOPIC NOT DONE ON URINES WITH NEGATIVE PROTEIN, BLOOD, LEUKOCYTES, NITRITE, OR GLUCOSE <1000 mg/dL.  SURGICAL PCR SCREEN     Status: None   Collection Time    06/01/14  8:31 PM      Result Value Ref Range   MRSA, PCR NEGATIVE  NEGATIVE   Staphylococcus aureus NEGATIVE  NEGATIVE   Comment:            The Xpert SA Assay (FDA     approved for NASAL specimens     in patients over 64 years of age),     is one component of     a comprehensive surveillance     program.  Test performance has     been validated by [employer] for patients greater     than or equal to 18 year old.     It is not intended     to diagnose infection nor to     guide or monitor treatment.  UA repeated w/ cath specimen due to suspected contamination w/ vaginal bleeding.    IMAGING US Transvaginal Non-ob  06/01/2014   CLINICAL DATA:  Vaginal bleeding and pain; recent vaginal delivery  EXAM: TRANSABDOMINAL AND TRANSVAGINAL ULTRASOUND OF PELVIS  TECHNIQUE: Study was performed transabdominally to optimize pelvic field of view and transvaginally to optimize internal visceral architecture evaluation.  COMPARISON:  None  FINDINGS: Uterus  Measurements: 12.5 x 7.3 x 8.9 cm consistent with recent pregnancy. No fibroids or other myometrial region mass visualized.  Endometrium  Thickness: 2.8 cm. There is irregular material within the endometrium and increased blood flow, findings concerning for retained products of conception.  Right ovary  Measurements: 1.3 x 1.9 x 1.3 cm. Normal appearance/no adnexal mass.  Left ovary  Measurements: 2.2 x 1.8 x 2.3 cm. Normal  appearance/no adnexal mass.  Other findings  No free fluid.  Urinary bladder appears unremarkable.  IMPRESSION: Findings concerning for retained products of conception. Prominence of the uterus is consistent with recent vaginal delivery. No extrauterine pelvic mass or fluid appreciable.  These results will be called to the ordering clinician or representative by the Radiologist Assistant, and communication documented in the PACS or zVision Dashboard.   Electronically Signed   By: Lowella Grip M.D.   On: 06/01/2014 16:45   US Pelvis Complete  06/01/2014   CLINICAL DATA:  Vaginal bleeding and pain; recent vaginal delivery  EXAM: TRANSABDOMINAL AND TRANSVAGINAL ULTRASOUND OF PELVIS  TECHNIQUE: Study was performed transabdominally to optimize pelvic field of view and transvaginally to optimize internal visceral architecture evaluation.  COMPARISON:  None  FINDINGS: Uterus  Measurements: 12.5 x 7.3 x 8.9 cm consistent with recent pregnancy. No fibroids or other myometrial region mass visualized.  Endometrium  Thickness: 2.8 cm. There is irregular material within the endometrium and increased blood flow, findings concerning for retained products of conception.  Right ovary  Measurements: 1.3 x 1.9 x 1.3 cm. Normal appearance/no adnexal mass.  Left ovary  Measurements: 2.2 x 1.8 x 2.3 cm. Normal appearance/no adnexal mass.  Other findings  No free fluid.  Urinary bladder appears unremarkable.  IMPRESSION: Findings concerning for retained products of conception. Prominence of the uterus is consistent with recent vaginal delivery. No extrauterine pelvic mass or fluid appreciable.  These results will be called to the ordering clinician or representative by the Radiologist Assistant, and communication documented in the PACS or zVision Dashboard.   Electronically Signed   By: Lowella Grip M.D.   On: 06/01/2014 16:45   MAU COURSE Dr. Philis Pique notified of pt Sx, lab and [place] results. Last solid food 1300. Dr Elwin Sleight  offered D&E 8 hours after last meal of to schedule D&E in am. Pt prefers to stay and do it tonight. NPO. Pt requests Ibuprofen.   ASSESSMENT 1. Retained products of conception after delivery without hemorrhage   2.  Constipation  PLAN D&E at 2100 (8 hours after last meal) NPO Nubain PRN  Manya Silvas, CNM 06/01/2014  11:48 PM

## 2014-06-01 NOTE — Anesthesia Procedure Notes (Signed)
Spinal  Patient location during procedure: OR Start time: 06/01/2014 9:22 PM Staffing Anesthesiologist: CASSIDY, AMY Performed by: anesthesiologist  Preanesthetic Checklist Completed: patient identified, site marked, surgical consent, pre-op evaluation, timeout performed, IV checked, risks and benefits discussed and monitors and equipment checked Spinal Block Patient position: sitting Prep: site prepped and draped and DuraPrep Patient monitoring: heart rate, cardiac monitor, continuous pulse ox and blood pressure Approach: midline Location: L3-4 Injection technique: single-shot Needle Needle type: Pencan  Needle gauge: 24 G Needle length: 9 cm Assessment Sensory level: T10 Additional Notes Clear free flow CSF on first attempt.  No paresthesia.  Patient tolerated procedure well with no apparent complications.  Allowed to sit for 1 minute to allow for saddle block.  Charlton Haws, MD

## 2014-06-01 NOTE — Brief Op Note (Addendum)
06/01/2014  9:42 PM  PATIENT:  Kelly Buck  33 y.o. female  PRE-OPERATIVE DIAGNOSIS:  retained products of conception/placenta  POST-OPERATIVE DIAGNOSIS:  retained products of conception/placenta  PROCEDURE:  Procedure(s): DILATATION AND EVACUATION (N/A)  SURGEON:  Surgeon(s) and Role:    * [name], MD - Primary  ANESTHESIA:   spinal  EBL:  Total I/O In: 970.5 [I.V.:970.5] Out: 850 [Urine:750; Blood:100]   SPECIMEN:  Source of Specimen:  uterine contents  DISPOSITION OF SPECIMEN:  PATHOLOGY  COUNTS:  YES  TOURNIQUET:  * No tourniquets in log *  DICTATION: .Note written in EPIC  PLAN OF CARE: Discharge to home after PACU  PATIENT DISPOSITION:  PACU - hemodynamically stable.   Delay start of Pharmacological VTE agent (>24hrs) due to surgical blood loss or risk of bleeding: not applicable  Medications: Methergine  Complications: None  Findings:  18 week size uterus to 16 size post procedure.  Good crie was achieved.  After adequate anesthesia was achieved, the patient was prepped and draped in the usual sterile fashion.  The speculum was placed in the vagina and the cervix stabilized with a single-tooth tenaculum.  The cervix was dilated with Kennon Rounds dilators and the 14 mm curette was used to remove contents of the uterus.  Alternating sharp curettage with a curette and suction curettage was performed until all contents were removed and good crie was achieved.  All instruments were removed from the vagina.  The patient tolerated the procedure well.    Ayianna Darnold A

## 2014-06-01 NOTE — Transfer of Care (Signed)
Immediate Anesthesia Transfer of Care Note  Patient: Kelly Buck  Procedure(s) Performed: Procedure(s): DILATATION AND EVACUATION (N/A)  Patient Location: PACU  Anesthesia Type:Spinal  Level of Consciousness: awake, alert , oriented and patient cooperative  Airway & Oxygen Therapy: Patient Spontanous Breathing  Post-op Assessment: Report given to PACU RN and Post -op Vital signs reviewed and stable  Post vital signs: Reviewed and stable  Complications: No apparent anesthesia complications

## 2014-06-01 NOTE — MAU Note (Signed)
Vag del on 7/31, was doing well, 2 days ago began having abdominal swelling & intense pain, abdomen is hard.  States vag bleeding is minimal, but has been passing clots for the past 2 days.  Having BM's, but not like normal.

## 2014-06-01 NOTE — Anesthesia Preprocedure Evaluation (Addendum)
Anesthesia Evaluation  Patient identified by MRN, date of birth, ID band Patient awake    Reviewed: Allergy & Precautions, H&P , NPO status , Patient's Chart, lab work & pertinent test results, reviewed documented beta blocker date and time   History of Anesthesia Complications Negative for: history of anesthetic complications  Airway Mallampati: I TM Distance: >3 FB Neck ROM: full    Dental  (+) Teeth Intact   Pulmonary neg pulmonary ROS,  breath sounds clear to auscultation  Pulmonary exam normal       Cardiovascular negative cardio ROS  Rhythm:regular Rate:Normal     Neuro/Psych negative neurological ROS  negative psych ROS   GI/Hepatic negative GI ROS, Neg liver ROS,   Endo/Other  Obese - BMI 39.4  Renal/GU negative Renal ROS     Musculoskeletal   Abdominal   Peds  Hematology negative hematology ROS (+)   Anesthesia Other Findings Ate salad at 1300  Reproductive/Obstetrics negative OB ROS S/p SVD on 05/21/14, retained products of conception                        Anesthesia Physical Anesthesia Plan  ASA: II  Anesthesia Plan: MAC and Spinal   Post-op Pain Management:    Induction:   Airway Management Planned:   Additional Equipment:   Intra-op Plan:   Post-operative Plan:   Informed Consent: I have reviewed the patients History and Physical, chart, labs and discussed the procedure including the risks, benefits and alternatives for the proposed anesthesia with the patient or authorized representative who has indicated his/her understanding and acceptance.   Dental Advisory Given  Plan Discussed with: CRNA and Surgeon  Anesthesia Plan Comments:       Anesthesia Quick Evaluation

## 2014-06-01 NOTE — H&P (Signed)
Chief Complaint: Abdominal Pain and Vaginal Bleeding   None  SUBJECTIVE  HPI: Kelly Buck is a 33 y.o. [id number] at [redacted]w[redacted]d by LMP who presents with  Vag del on 7/31, was doing well, 2 days ago began having abdominal swelling & intense pain, abdomen is hard. States vag bleeding is minimal, but has been passing clots for the past 2 days. Having BM's, but not like normal  Past Medical History   Diagnosis  Date   .  Medical history non-contributory     OB History   Gravida  Para  Term  Preterm  AB  SAB  TAB  Ectopic  Multiple  Living   5  4  4   1  1     4      #  Outcome  Date  GA  Lbr Len/2nd  Weight  Sex  Delivery  Anes  PTL  Lv   5  TRM  05/21/14  [redacted]w[redacted]d  -10:59 / 00:11  3.294 kg (7 lb 4.2 oz)  F  SVD  EPI   Y   4  SAB            3  TRM            2  TRM            1  TRM               Past Surgical History   Procedure  Laterality  Date   .  Placement of breast implants     .  Carpal tunnel release     .  Tummy tuck      History    Social History   .  Marital Status:  Married     Spouse Name:  N/A     Number of Children:  N/A   .  Years of Education:  N/A    Occupational History   .  Not on file.    Social History Main Topics   .  Smoking status:  Never Smoker   .  Smokeless tobacco:  Not on file   .  Alcohol Use:  No   .  Drug Use:  No   .  Sexual Activity:  Yes    Other Topics  Concern   .  Not on file    Social History Narrative   .  No narrative on file    No current facility-administered medications on file prior to encounter.    Current Outpatient Prescriptions on File Prior to Encounter   Medication  Sig  Dispense  Refill   .  acetaminophen (TYLENOL) 500 MG tablet  Take 1,000 mg by mouth every 6 (six) hours as needed for moderate pain.     .  prenatal vitamin w/FE, FA (NATACHEW) 29-1 MG CHEW chewable tablet  Chew 2 tablets by mouth daily at 12 noon.      No Known Allergies  ROS: Pertinent items in HPI  OBJECTIVE  Blood pressure 129/64, pulse 84,  temperature 98.2 F (36.8 C), temperature source Axillary, resp. rate 18, last menstrual period 08/26/2013, currently breastfeeding.  GENERAL: Well-developed, well-nourished female in no acute distress.  HEENT: Normocephalic  HEART: normal rate  RESP: normal effort  ABDOMEN: Soft, non-tender  EXTREMITIES: Nontender, no edema  NEURO: Alert and oriented  SPECULUM EXAM: NEFG, physiologic discharge, no blood noted, cervix clean  BIMANUAL: uterus above umbilicus, very tender per NP  LAB RESULTS  Results for orders  placed during the hospital encounter of 06/01/14 (from the past 24 hour(s))   URINALYSIS, ROUTINE W REFLEX MICROSCOPIC Status: Abnormal    Collection Time    06/01/14 2:10 PM   Result  Value  Ref Range    Color, Urine  AMBER (*)  YELLOW    APPearance  HAZY (*)  CLEAR    Specific Gravity, Urine  1.010  1.005 - 1.030    pH  7.5  5.0 - 8.0    Glucose, UA  NEGATIVE  NEGATIVE mg/dL    Hgb urine dipstick  LARGE (*)  NEGATIVE    Bilirubin Urine  NEGATIVE  NEGATIVE    Ketones, ur  NEGATIVE  NEGATIVE mg/dL    Protein, ur  NEGATIVE  NEGATIVE mg/dL    Urobilinogen, UA  0.2  0.0 - 1.0 mg/dL    Nitrite  NEGATIVE  NEGATIVE    Leukocytes, UA  LARGE (*)  NEGATIVE   URINE MICROSCOPIC-ADD ON Status: Abnormal    Collection Time    06/01/14 2:10 PM   Result  Value  Ref Range    Squamous Epithelial / LPF  FEW (*)  RARE    WBC, UA  21-50  <3 WBC/hpf    RBC / HPF  0-2  <3 RBC/hpf    Bacteria, UA  FEW (*)  RARE   CBC Status: Abnormal    Collection Time    06/01/14 2:40 PM   Result  Value  Ref Range    WBC  12.9 (*)  4.0 - 10.5 K/uL    RBC  4.17  3.87 - 5.11 MIL/uL    Hemoglobin  12.6  12.0 - 15.0 g/dL    HCT  37.4  36.0 - 46.0 %    MCV  89.7  78.0 - 100.0 fL    MCH  30.2  26.0 - 34.0 pg    MCHC  33.7  30.0 - 36.0 g/dL    RDW  13.9  11.5 - 15.5 %    Platelets  408 (*)  150 - 400 K/uL   URINALYSIS, ROUTINE W REFLEX MICROSCOPIC Status: None    Collection Time    06/01/14 5:11 PM    Result  Value  Ref Range    Color, Urine  YELLOW  YELLOW    APPearance  CLEAR  CLEAR    Specific Gravity, Urine  1.010  1.005 - 1.030    pH  8.0  5.0 - 8.0    Glucose, UA  NEGATIVE  NEGATIVE mg/dL    Hgb urine dipstick  NEGATIVE  NEGATIVE    Bilirubin Urine  NEGATIVE  NEGATIVE    Ketones, ur  NEGATIVE  NEGATIVE mg/dL    Protein, ur  NEGATIVE  NEGATIVE mg/dL    Urobilinogen, UA  0.2  0.0 - 1.0 mg/dL    Nitrite  NEGATIVE  NEGATIVE    Leukocytes, UA  NEGATIVE  NEGATIVE    Agree with above from NP.  IMAGING  Ultrasound 3 cm em with debris and possible blood flow- may be retained products. No fever and only mild WBC elevation.  ASSESSMENT  Possible retained placenta without signs of infection. For D&E.  [name], MD  06/01/2014  6:52 PM

## 2014-06-01 NOTE — MAU Provider Note (Signed)
Chief Complaint: Abdominal Pain and Vaginal Bleeding   None    SUBJECTIVE HPI: Kelly Buck is a 33 y.o. [id number] at [redacted]w[redacted]d by LMP who presents with   Vag del on 7/31, was doing well, 2 days ago began having abdominal swelling & intense pain, abdomen is hard. States vag bleeding is minimal, but has been passing clots for the past 2 days. Having BM's, but not like normal   Past Medical History  Diagnosis Date  . Medical history non-contributory    OB History  Gravida Para Term Preterm AB SAB TAB Ectopic Multiple Living  5 4 4  1 1    4     # Outcome Date GA Lbr Len/2nd Weight Sex Delivery Anes PTL Lv  5 TRM 05/21/14 [redacted]w[redacted]d -10:59 / 00:11 3.294 kg (7 lb 4.2 oz) F SVD EPI  Y  4 SAB           3 TRM           2 TRM           1 TRM              Past Surgical History  Procedure Laterality Date  . Placement of breast implants    . Carpal tunnel release    . Tummy tuck     History   Social History  . Marital Status: Married    Spouse Name: N/A    Number of Children: N/A  . Years of Education: N/A   Occupational History  . Not on file.   Social History Main Topics  . Smoking status: Never Smoker   . Smokeless tobacco: Not on file  . Alcohol Use: No  . Drug Use: No  . Sexual Activity: Yes   Other Topics Concern  . Not on file   Social History Narrative  . No narrative on file   No current facility-administered medications on file prior to encounter.   Current Outpatient Prescriptions on File Prior to Encounter  Medication Sig Dispense Refill  . acetaminophen (TYLENOL) 500 MG tablet Take 1,000 mg by mouth every 6 (six) hours as needed for moderate pain.      . prenatal vitamin w/FE, FA (NATACHEW) 29-1 MG CHEW chewable tablet Chew 2 tablets by mouth daily at 12 noon.        No Known Allergies  ROS: Pertinent items in HPI  OBJECTIVE Blood pressure 129/64, pulse 84, temperature 98.2 F (36.8 C), temperature source Axillary, resp. rate 18, last menstrual period  08/26/2013, currently breastfeeding. GENERAL: Well-developed, well-nourished female in no acute distress.  HEENT: Normocephalic HEART: normal rate RESP: normal effort ABDOMEN: Soft, non-tender EXTREMITIES: Nontender, no edema NEURO: Alert and oriented SPECULUM EXAM: NEFG, physiologic discharge, no blood noted, cervix clean BIMANUAL: uterus above umbilicus, very tender per NP  LAB RESULTS Results for orders placed during the hospital encounter of 06/01/14 (from the past 24 hour(s))  URINALYSIS, ROUTINE W REFLEX MICROSCOPIC     Status: Abnormal   Collection Time    06/01/14  2:10 PM      Result Value Ref Range   Color, Urine AMBER (*) YELLOW   APPearance HAZY (*) CLEAR   Specific Gravity, Urine 1.010  1.005 - 1.030   pH 7.5  5.0 - 8.0   Glucose, UA NEGATIVE  NEGATIVE mg/dL   Hgb urine dipstick LARGE (*) NEGATIVE   Bilirubin Urine NEGATIVE  NEGATIVE   Ketones, ur NEGATIVE  NEGATIVE mg/dL   Protein, ur NEGATIVE  NEGATIVE mg/dL   Urobilinogen, UA 0.2  0.0 - 1.0 mg/dL   Nitrite NEGATIVE  NEGATIVE   Leukocytes, UA LARGE (*) NEGATIVE  URINE MICROSCOPIC-ADD ON     Status: Abnormal   Collection Time    06/01/14  2:10 PM      Result Value Ref Range   Squamous Epithelial / LPF FEW (*) RARE   WBC, UA 21-50  <3 WBC/hpf   RBC / HPF 0-2  <3 RBC/hpf   Bacteria, UA FEW (*) RARE  CBC     Status: Abnormal   Collection Time    06/01/14  2:40 PM      Result Value Ref Range   WBC 12.9 (*) 4.0 - 10.5 K/uL   RBC 4.17  3.87 - 5.11 MIL/uL   Hemoglobin 12.6  12.0 - 15.0 g/dL   HCT 37.4  36.0 - 46.0 %   MCV 89.7  78.0 - 100.0 fL   MCH 30.2  26.0 - 34.0 pg   MCHC 33.7  30.0 - 36.0 g/dL   RDW 13.9  11.5 - 15.5 %   Platelets 408 (*) 150 - 400 K/uL  URINALYSIS, ROUTINE W REFLEX MICROSCOPIC     Status: None   Collection Time    06/01/14  5:11 PM      Result Value Ref Range   Color, Urine YELLOW  YELLOW   APPearance CLEAR  CLEAR   Specific Gravity, Urine 1.010  1.005 - 1.030   pH 8.0  5.0 - 8.0    Glucose, UA NEGATIVE  NEGATIVE mg/dL   Hgb urine dipstick NEGATIVE  NEGATIVE   Bilirubin Urine NEGATIVE  NEGATIVE   Ketones, ur NEGATIVE  NEGATIVE mg/dL   Protein, ur NEGATIVE  NEGATIVE mg/dL   Urobilinogen, UA 0.2  0.0 - 1.0 mg/dL   Nitrite NEGATIVE  NEGATIVE   Leukocytes, UA NEGATIVE  NEGATIVE   Agree with above from NP.  IMAGING Ultrasound 3 cm em with debris and possible blood flow- may be retained products.  No fever and only mild WBC elevation.     ASSESSMENT Possible retained placenta without signs of infection.  For D&E.     [name], MD 06/01/2014  6:52 PM

## 2014-06-01 NOTE — Op Note (Signed)
06/01/2014  9:42 PM  PATIENT:  Kelly Buck  33 y.o. female  PRE-OPERATIVE DIAGNOSIS:  retained products of conception/placenta  POST-OPERATIVE DIAGNOSIS:  retained products of conception/placenta  PROCEDURE:  Procedure(s): DILATATION AND EVACUATION (N/A)  SURGEON:  Surgeon(s) and Role:    * [name], MD - Primary  ANESTHESIA:   spinal  EBL:  Total I/O In: 970.5 [I.V.:970.5] Out: 850 [Urine:750; Blood:100]   SPECIMEN:  Source of Specimen:  uterine contents  DISPOSITION OF SPECIMEN:  PATHOLOGY  COUNTS:  YES  TOURNIQUET:  * No tourniquets in log *  DICTATION: .Note written in EPIC  PLAN OF CARE: Discharge to home after PACU  PATIENT DISPOSITION:  PACU - hemodynamically stable.   Delay start of Pharmacological VTE agent (>24hrs) due to surgical blood loss or risk of bleeding: not applicable  Medications: Methergine  Complications: None  Findings:  18 week size uterus to 16 size post procedure.  Good crie was achieved.  After adequate anesthesia was achieved, the patient was prepped and draped in the usual sterile fashion.  The speculum was placed in the vagina and the cervix stabilized with a single-tooth tenaculum.  The cervix was dilated with Kennon Rounds dilators and the 14 mm curette was used to remove contents of the uterus.  Alternating sharp curettage with a curette and suction curettage was performed until all contents were removed and good crie was achieved.  All instruments were removed from the vagina.  The patient tolerated the procedure well.    Nashid Pellum A

## 2014-06-01 NOTE — Progress Notes (Signed)
To room 317 via W/C. Consent signed.

## 2014-06-02 MED ORDER — DOCUSATE SODIUM 100 MG PO CAPS
100.0000 mg | ORAL_CAPSULE | Freq: Two times a day (BID) | ORAL | Status: DC
Start: 2014-06-02 — End: 2014-07-29

## 2014-06-02 NOTE — Anesthesia Postprocedure Evaluation (Addendum)
  Anesthesia Post-op Note   Anesthesia Post Note  Patient: Kelly Buck  Procedure(s) Performed: Procedure(s) (LRB): DILATATION AND EVACUATION (N/A)  Anesthesia type: Spinal  Patient location: PACU  Post pain: Pain level controlled  Post assessment: Post-op Vital signs reviewed  Last Vitals:  Filed Vitals:   06/01/14 2330  BP: 148/89  Pulse: 60  Temp: 36.8 C  Resp:     Post vital signs: Reviewed  Level of consciousness: awake  Complications: No apparent anesthesia complications

## 2014-06-02 NOTE — Progress Notes (Signed)
Pt comfortable.  Kept o/n secondary recovering from spinal.  Filed Vitals:   06/01/14 2315 06/01/14 2330 06/02/14 0030 06/02/14 0400  BP:  148/89 123/56 130/76  Pulse: 51 60 55 50  Temp:  98.3 F (36.8 C)  98.1 F (36.7 C)  TempSrc:   Oral Oral  Resp: 5  18 12   Height:      Weight:      SpO2: 98% 98% 96% 97%   Abd soft, nt, nd this am  D/C to home with ibuprofen.

## 2014-06-02 NOTE — Progress Notes (Signed)
UR chart review completed.  

## 2014-06-02 NOTE — Progress Notes (Signed)
Discharge teaching complete. Pt understood all instructions and did not have any questions. Pt ambulated out of hospital and discharge home to family.

## 2014-06-03 ENCOUNTER — Encounter (HOSPITAL_COMMUNITY): Payer: Self-pay | Admitting: Obstetrics and Gynecology

## 2014-06-04 ENCOUNTER — Inpatient Hospital Stay (HOSPITAL_COMMUNITY): Payer: Medicaid Other | Admitting: Anesthesiology

## 2014-06-04 ENCOUNTER — Inpatient Hospital Stay (HOSPITAL_COMMUNITY): Payer: Medicaid Other

## 2014-06-04 ENCOUNTER — Encounter (HOSPITAL_COMMUNITY): Payer: Medicaid Other | Admitting: Anesthesiology

## 2014-06-04 ENCOUNTER — Encounter (HOSPITAL_COMMUNITY)
Admission: AD | Disposition: A | Discharge status: Home / Self Care | Payer: Self-pay | Source: Ambulatory Visit | Attending: Obstetrics and Gynecology

## 2014-06-04 ENCOUNTER — Inpatient Hospital Stay (HOSPITAL_COMMUNITY)
Admission: AD | Admit: 2014-06-04 | Discharge: 2014-06-07 | DRG: 769 | Disposition: A | Discharge status: Home / Self Care | Payer: Medicaid Other | Source: Ambulatory Visit | Attending: Obstetrics and Gynecology | Admitting: Obstetrics and Gynecology

## 2014-06-04 ENCOUNTER — Encounter (HOSPITAL_COMMUNITY): Payer: Self-pay

## 2014-06-04 DIAGNOSIS — O8612 Endometritis following delivery: Secondary | ICD-10-CM | POA: Diagnosis present

## 2014-06-04 DIAGNOSIS — R109 Unspecified abdominal pain: Secondary | ICD-10-CM | POA: Diagnosis not present

## 2014-06-04 DIAGNOSIS — K5900 Constipation, unspecified: Secondary | ICD-10-CM | POA: Diagnosis present

## 2014-06-04 HISTORY — PX: DILATION AND EVACUATION: SHX1459

## 2014-06-04 LAB — CBC WITH DIFFERENTIAL/PLATELET
Basophils Absolute: 0 10*3/uL (ref 0.0–0.1)
Basophils Relative: 0 % (ref 0–1)
EOS PCT: 4 % (ref 0–5)
Eosinophils Absolute: 0.4 10*3/uL (ref 0.0–0.7)
HCT: 37.6 % (ref 36.0–46.0)
HEMOGLOBIN: 12.8 g/dL (ref 12.0–15.0)
LYMPHS ABS: 3.5 10*3/uL (ref 0.7–4.0)
LYMPHS PCT: 38 % (ref 12–46)
MCH: 30.4 pg (ref 26.0–34.0)
MCHC: 34 g/dL (ref 30.0–36.0)
MCV: 89.3 fL (ref 78.0–100.0)
MONO ABS: 0.7 10*3/uL (ref 0.1–1.0)
Monocytes Relative: 8 % (ref 3–12)
NEUTROS ABS: 4.6 10*3/uL (ref 1.7–7.7)
Neutrophils Relative %: 50 % (ref 43–77)
Platelets: 413 10*3/uL — ABNORMAL HIGH (ref 150–400)
RBC: 4.21 MIL/uL (ref 3.87–5.11)
RDW: 13.7 % (ref 11.5–15.5)
WBC: 9.1 10*3/uL (ref 4.0–10.5)

## 2014-06-04 LAB — URINALYSIS, ROUTINE W REFLEX MICROSCOPIC
BILIRUBIN URINE: NEGATIVE
GLUCOSE, UA: NEGATIVE mg/dL
KETONES UR: NEGATIVE mg/dL
LEUKOCYTES UA: NEGATIVE
NITRITE: NEGATIVE
Protein, ur: NEGATIVE mg/dL
SPECIFIC GRAVITY, URINE: 1.01 (ref 1.005–1.030)
Urobilinogen, UA: 0.2 mg/dL (ref 0.0–1.0)
pH: 7.5 (ref 5.0–8.0)

## 2014-06-04 LAB — URINE MICROSCOPIC-ADD ON

## 2014-06-04 SURGERY — DILATION AND EVACUATION, UTERUS
Anesthesia: General | Site: Vagina

## 2014-06-04 MED ORDER — KETOROLAC TROMETHAMINE 30 MG/ML IJ SOLN: 15.0000 mg | Freq: Once | INTRAMUSCULAR | Status: DC | PRN

## 2014-06-04 MED ORDER — KETOROLAC TROMETHAMINE 30 MG/ML IJ SOLN
INTRAMUSCULAR | Status: AC
  Filled 2014-06-04: qty 1

## 2014-06-04 MED ORDER — MEPERIDINE HCL 25 MG/ML IJ SOLN
6.2500 mg | INTRAMUSCULAR | Status: DC | PRN
  Administered 2014-06-04: 12.5 mg via INTRAVENOUS

## 2014-06-04 MED ORDER — MISOPROSTOL 100 MCG PO TABS
ORAL_TABLET | ORAL | Status: DC | PRN
  Administered 2014-06-04: 1000 ug via RECTAL

## 2014-06-04 MED ORDER — KETOROLAC TROMETHAMINE 30 MG/ML IJ SOLN
30.0000 mg | Freq: Four times a day (QID) | INTRAMUSCULAR | Status: DC
  Administered 2014-06-04: 30 mg via INTRAVENOUS

## 2014-06-04 MED ORDER — DOCUSATE SODIUM 100 MG PO CAPS
100.0000 mg | ORAL_CAPSULE | Freq: Two times a day (BID) | ORAL | Status: DC
  Administered 2014-06-04 – 2014-06-07 (×6): 100 mg via ORAL
  Filled 2014-06-04 (×6): qty 1

## 2014-06-04 MED ORDER — ALUM & MAG HYDROXIDE-SIMETH 200-200-20 MG/5ML PO SUSP: 30.0000 mL | ORAL | Status: DC | PRN

## 2014-06-04 MED ORDER — IBUPROFEN 800 MG PO TABS
800.0000 mg | ORAL_TABLET | Freq: Three times a day (TID) | ORAL | Status: DC
  Administered 2014-06-05 – 2014-06-07 (×6): 800 mg via ORAL
  Filled 2014-06-04 (×6): qty 1

## 2014-06-04 MED ORDER — LIDOCAINE HCL (CARDIAC) 20 MG/ML IV SOLN
INTRAVENOUS | Status: DC | PRN
  Administered 2014-06-04: 50 mg via INTRAVENOUS

## 2014-06-04 MED ORDER — HYDROMORPHONE HCL PF 1 MG/ML IJ SOLN
0.2500 mg | INTRAMUSCULAR | Status: DC | PRN
  Administered 2014-06-04 (×4): 0.5 mg via INTRAVENOUS

## 2014-06-04 MED ORDER — SODIUM CHLORIDE 0.9 % IV SOLN
2.0000 g | Freq: Four times a day (QID) | INTRAVENOUS | Status: DC
  Administered 2014-06-04 – 2014-06-07 (×10): 2 g via INTRAVENOUS
  Filled 2014-06-04 (×12): qty 2000

## 2014-06-04 MED ORDER — LACTATED RINGERS IV SOLN
INTRAVENOUS | Status: DC | PRN
  Administered 2014-06-04: 17:00:00 via INTRAVENOUS

## 2014-06-04 MED ORDER — FENTANYL CITRATE 0.05 MG/ML IJ SOLN
INTRAMUSCULAR | Status: DC | PRN
  Administered 2014-06-04: 100 ug via INTRAVENOUS

## 2014-06-04 MED ORDER — CEFAZOLIN SODIUM-DEXTROSE 2-3 GM-% IV SOLR
INTRAVENOUS | Status: DC | PRN
  Administered 2014-06-04: 2 g via INTRAVENOUS

## 2014-06-04 MED ORDER — HYDROMORPHONE HCL PF 1 MG/ML IJ SOLN
INTRAMUSCULAR | Status: AC
  Filled 2014-06-04: qty 1

## 2014-06-04 MED ORDER — GLYCOPYRROLATE 0.2 MG/ML IJ SOLN
INTRAMUSCULAR | Status: DC | PRN
  Administered 2014-06-04: 0.2 mg via INTRAVENOUS

## 2014-06-04 MED ORDER — CEFAZOLIN SODIUM 1-5 GM-% IV SOLN: INTRAVENOUS | Status: DC | PRN

## 2014-06-04 MED ORDER — ONDANSETRON HCL 4 MG/2ML IJ SOLN: 4.0000 mg | Freq: Four times a day (QID) | INTRAMUSCULAR | Status: DC | PRN

## 2014-06-04 MED ORDER — PRENATAL MULTIVITAMIN CH: 1.0000 | ORAL_TABLET | Freq: Every day | ORAL | Status: DC

## 2014-06-04 MED ORDER — COMPLETENATE 29-1 MG PO CHEW
1.0000 | CHEWABLE_TABLET | Freq: Every day | ORAL | Status: DC
  Administered 2014-06-05 – 2014-06-07 (×3): 1 via ORAL
  Filled 2014-06-04 (×4): qty 1

## 2014-06-04 MED ORDER — SODIUM CHLORIDE 0.9 % IV SOLN
250.0000 mL | INTRAVENOUS | Status: DC | PRN
  Administered 2014-06-04: 250 mL via INTRAVENOUS

## 2014-06-04 MED ORDER — SODIUM CHLORIDE 0.9 % IV SOLN
INTRAVENOUS | Status: DC
  Administered 2014-06-04 – 2014-06-06 (×2): via INTRAVENOUS

## 2014-06-04 MED ORDER — BUTORPHANOL TARTRATE 1 MG/ML IJ SOLN
1.0000 mg | INTRAMUSCULAR | Status: DC | PRN
  Administered 2014-06-04: 1 mg via INTRAVENOUS
  Filled 2014-06-04: qty 1

## 2014-06-04 MED ORDER — BUTORPHANOL TARTRATE 1 MG/ML IJ SOLN: 1.0000 mg | Freq: Once | INTRAMUSCULAR | Status: DC

## 2014-06-04 MED ORDER — 0.9 % SODIUM CHLORIDE (POUR BTL) OPTIME
TOPICAL | Status: DC | PRN
  Administered 2014-06-04: 1000 mL

## 2014-06-04 MED ORDER — MEPERIDINE HCL 25 MG/ML IJ SOLN
INTRAMUSCULAR | Status: AC
  Filled 2014-06-04: qty 1

## 2014-06-04 MED ORDER — SCOPOLAMINE 1 MG/3DAYS TD PT72
MEDICATED_PATCH | TRANSDERMAL | Status: DC | PRN
  Administered 2014-06-04: 1 via TRANSDERMAL

## 2014-06-04 MED ORDER — METHYLERGONOVINE MALEATE 0.2 MG/ML IJ SOLN
INTRAMUSCULAR | Status: DC | PRN
  Administered 2014-06-04: 0.2 mg via INTRAMUSCULAR

## 2014-06-04 MED ORDER — SODIUM CHLORIDE 0.9 % IJ SOLN: 3.0000 mL | Freq: Two times a day (BID) | INTRAMUSCULAR | Status: DC

## 2014-06-04 MED ORDER — GENTAMICIN SULFATE 40 MG/ML IJ SOLN
160.0000 mg | Freq: Three times a day (TID) | INTRAVENOUS | Status: DC
  Administered 2014-06-04 – 2014-06-07 (×8): 160 mg via INTRAVENOUS
  Filled 2014-06-04 (×9): qty 4

## 2014-06-04 MED ORDER — MIDAZOLAM HCL 2 MG/2ML IJ SOLN
INTRAMUSCULAR | Status: DC | PRN
  Administered 2014-06-04: 2 mg via INTRAVENOUS

## 2014-06-04 MED ORDER — SODIUM CHLORIDE 0.9 % IJ SOLN: 3.0000 mL | INTRAMUSCULAR | Status: DC | PRN

## 2014-06-04 MED ORDER — PROPOFOL 10 MG/ML IV BOLUS
INTRAVENOUS | Status: DC | PRN
  Administered 2014-06-04: 200 mg via INTRAVENOUS

## 2014-06-04 MED ORDER — PROMETHAZINE HCL 25 MG/ML IJ SOLN: 6.2500 mg | INTRAMUSCULAR | Status: DC | PRN

## 2014-06-04 MED ORDER — ACETAMINOPHEN 500 MG PO TABS: 1000.0000 mg | ORAL_TABLET | Freq: Four times a day (QID) | ORAL | Status: DC | PRN

## 2014-06-04 MED ORDER — ONDANSETRON HCL 4 MG PO TABS: 4.0000 mg | ORAL_TABLET | Freq: Four times a day (QID) | ORAL | Status: DC | PRN

## 2014-06-04 MED ORDER — FAMOTIDINE 20 MG PO TABS
20.0000 mg | ORAL_TABLET | Freq: Two times a day (BID) | ORAL | Status: DC
  Administered 2014-06-04 – 2014-06-07 (×6): 20 mg via ORAL
  Filled 2014-06-04 (×6): qty 1

## 2014-06-04 MED ORDER — ONDANSETRON HCL 4 MG/2ML IJ SOLN
INTRAMUSCULAR | Status: DC | PRN
  Administered 2014-06-04: 4 mg via INTRAVENOUS

## 2014-06-04 MED ORDER — OXYCODONE-ACETAMINOPHEN 5-325 MG PO TABS
1.0000 | ORAL_TABLET | ORAL | Status: DC | PRN
  Administered 2014-06-04 – 2014-06-05 (×3): 1 via ORAL
  Administered 2014-06-05 – 2014-06-06 (×3): 2 via ORAL
  Administered 2014-06-06: 1 via ORAL
  Administered 2014-06-07: 2 via ORAL
  Filled 2014-06-04: qty 2
  Filled 2014-06-04 (×2): qty 1
  Filled 2014-06-04 (×3): qty 2
  Filled 2014-06-04 (×2): qty 1

## 2014-06-04 MED ORDER — LACTATED RINGERS IV BOLUS (SEPSIS)
1000.0000 mL | Freq: Once | INTRAVENOUS | Status: AC
  Administered 2014-06-04: 1000 mL via INTRAVENOUS

## 2014-06-04 MED ORDER — KETOROLAC TROMETHAMINE 30 MG/ML IJ SOLN: 30.0000 mg | Freq: Four times a day (QID) | INTRAMUSCULAR | Status: DC

## 2014-06-04 MED ORDER — DEXAMETHASONE SODIUM PHOSPHATE 10 MG/ML IJ SOLN
INTRAMUSCULAR | Status: DC | PRN
  Administered 2014-06-04: 4 mg via INTRAVENOUS

## 2014-06-04 MED ORDER — SIMETHICONE 80 MG PO CHEW
80.0000 mg | CHEWABLE_TABLET | Freq: Four times a day (QID) | ORAL | Status: DC | PRN
Start: 2014-06-04 — End: 2014-06-07

## 2014-06-04 SURGICAL SUPPLY — 19 items
CATH ROBINSON RED A/P 16FR (CATHETERS) ×2 IMPLANT
CLOTH BEACON ORANGE TIMEOUT ST (SAFETY) ×2 IMPLANT
DECANTER SPIKE VIAL GLASS SM (MISCELLANEOUS) ×2 IMPLANT
GLOVE BIO SURGEON STRL SZ7 (GLOVE) ×2 IMPLANT
GLOVE BIOGEL PI IND STRL 7.0 (GLOVE) ×1 IMPLANT
GLOVE BIOGEL PI INDICATOR 7.0 (GLOVE) ×1
GOWN STRL REUS W/TWL LRG LVL3 (GOWN DISPOSABLE) ×4 IMPLANT
KIT BERKELEY 1ST TRIMESTER 3/8 (MISCELLANEOUS) ×2 IMPLANT
NS IRRIG 1000ML POUR BTL (IV SOLUTION) ×2 IMPLANT
PACK VAGINAL MINOR WOMEN LF (CUSTOM PROCEDURE TRAY) ×2 IMPLANT
PAD OB MATERNITY 4.3X12.25 (PERSONAL CARE ITEMS) ×2 IMPLANT
PAD PREP 24X48 CUFFED NSTRL (MISCELLANEOUS) ×2 IMPLANT
SET BERKELEY SUCTION TUBING (SUCTIONS) ×3 IMPLANT
TOWEL OR 17X24 6PK STRL BLUE (TOWEL DISPOSABLE) ×4 IMPLANT
VACURETTE 10 RIGID CVD (CANNULA) IMPLANT
VACURETTE 12 RIGID CVD (CANNULA) ×2 IMPLANT
VACURETTE 7MM CVD STRL WRAP (CANNULA) IMPLANT
VACURETTE 8 RIGID CVD (CANNULA) IMPLANT
VACURETTE 9 RIGID CVD (CANNULA) IMPLANT

## 2014-06-04 NOTE — Transfer of Care (Signed)
Immediate Anesthesia Transfer of Care Note  Patient: Kelly Buck  Procedure(s) Performed: Procedure(s): DILATATION AND EVACUATION (N/A)  Patient Location: PACU  Anesthesia Type:General  Level of Consciousness: awake, alert  and oriented  Airway & Oxygen Therapy: Patient Spontanous Breathing and Patient connected to nasal cannula oxygen  Post-op Assessment: Report given to PACU RN and Post -op Vital signs reviewed and stable  Post vital signs: Reviewed and stable  Complications: No apparent anesthesia complications

## 2014-06-04 NOTE — MAU Note (Signed)
Second CRNA at pt bedside

## 2014-06-04 NOTE — MAU Note (Signed)
Dr Horvath at bedside.  

## 2014-06-04 NOTE — Anesthesia Preprocedure Evaluation (Signed)
Anesthesia Evaluation    Reviewed: Allergy & Precautions, H&P , NPO status , Patient's Chart, lab work & pertinent test results, reviewed documented beta blocker date and time   Airway Mallampati: I TM Distance: >3 FB Neck ROM: full    Dental no notable dental hx. (+) Teeth Intact   Pulmonary neg pulmonary ROS,    Pulmonary exam normal       Cardiovascular negative cardio ROS      Neuro/Psych negative neurological ROS  negative psych ROS   GI/Hepatic negative GI ROS, Neg liver ROS,   Endo/Other  negative endocrine ROS  Renal/GU negative Renal ROS     Musculoskeletal   Abdominal Normal abdominal exam  (+)   Peds  Hematology negative hematology ROS (+)   Anesthesia Other Findings   Reproductive/Obstetrics negative OB ROS                           Anesthesia Physical Anesthesia Plan  ASA: II  Anesthesia Plan: General   Post-op Pain Management:    Induction: Intravenous  Airway Management Planned: LMA  Additional Equipment:   Intra-op Plan:   Post-operative Plan:   Informed Consent: I have reviewed the patients History and Physical, chart, labs and discussed the procedure including the risks, benefits and alternatives for the proposed anesthesia with the patient or authorized representative who has indicated his/her understanding and acceptance.     Plan Discussed with: CRNA and Surgeon  Anesthesia Plan Comments:         Anesthesia Quick Evaluation

## 2014-06-04 NOTE — Brief Op Note (Signed)
06/04/2014  6:01 PM  PATIENT:  Kelly Buck  33 y.o. female  PRE-OPERATIVE DIAGNOSIS:  retained products  POST-OPERATIVE DIAGNOSIS:  retained products, probable endometritis  PROCEDURE:  Procedure(s): DILATATION AND EVACUATION (N/A)  SURGEON:  Surgeon(s) and Role:    * [name], MD - Primary   ANESTHESIA:   general  EBL:  Total I/O In: 1000 [I.V.:1000] Out: 325 [Urine:275; Blood:50]  SPECIMEN:  Source of Specimen:  uterine contents, culture of uterine contents  DISPOSITION OF SPECIMEN:  PATHOLOGY  COUNTS:  YES  TOURNIQUET:  * No tourniquets in log *  DICTATION: .Note written in EPIC  PLAN OF CARE: Admit to inpatient   PATIENT DISPOSITION:  PACU - hemodynamically stable.   Delay start of Pharmacological VTE agent (>24hrs) due to surgical blood loss or risk of bleeding: not applicable  Medications: Methergine, cytotec  Complications: None  Findings:  13 week size uterus to 12 size post procedure.  Good crie was achieved.  [place] was used before, during and after- removal of tissue and blood seen with probe.  EM had some blood accumulated at the end but much smaller stripe.  After adequate anesthesia was achieved, the patient was prepped and draped in the usual sterile fashion.  The speculum was placed in the vagina and the cervix stabilized with a single-tooth tenaculum.  The cervix was dilated with Kennon Rounds dilators and the 12 mm curette was used to remove contents of the uterus.  Alternating sharp curettage with a curette and suction curettage was performed until all contents were removed and good crie was achieved, all under US guidance.  All instruments were removed from the vagina.  The patient tolerated the procedure well.  Cytotec 1000 mcg administered rectally.    Durga Saldarriaga A

## 2014-06-04 NOTE — MAU Provider Note (Signed)
History     CSN: [id number]  Arrival date and time: 06/04/14 1124   None     Chief Complaint  Patient presents with  . Abdominal Pain  . Vaginal Bleeding   HPI Kelly Buck 33 y.o.[id number] presents to MAU complaining of fever, pelvic pain and bleeding that began at midnight.  Prior to this, she noticed significant abrupt onset of fatigue.    She presently has 10/10 pain that is described as continuous crampy pain with intermittent sharp pains in bilat lower abdomen - worse on left.  Changing position causes pain to improve or worsen - depending.  No nausea, vomiting, constipation, diarrhea.  She has not eaten today, only coffee and PNV.  No Tylenol today  - only heating packs.       OB History   Grav Para Term Preterm Abortions TAB SAB Ect Mult Living   5 4 4  1  1   4       Past Medical History  Diagnosis Date  . Medical history non-contributory     Past Surgical History  Procedure Laterality Date  . Placement of breast implants    . Carpal tunnel release    . Tummy tuck    . Dilation and evacuation N/A 06/01/2014    Procedure: DILATATION AND EVACUATION;  Surgeon: [name], MD;  Location: [facility];  Service: Gynecology;  Laterality: N/A;    History reviewed. No pertinent family history.  History  Substance Use Topics  . Smoking status: Never Smoker   . Smokeless tobacco: Never Used  . Alcohol Use: No    Allergies: No Known Allergies  Prescriptions prior to admission  Medication Sig Dispense Refill  . acetaminophen (TYLENOL) 500 MG tablet Take 1,000 mg by mouth every 6 (six) hours as needed for moderate pain.      Marland Kitchen docusate sodium (COLACE) 100 MG capsule Take 1 capsule (100 mg total) by mouth 2 (two) times daily.  30 capsule  0  . FENUGREEK PO Take 1 capsule by mouth daily.      . prenatal vitamin w/FE, FA (NATACHEW) 29-1 MG CHEW chewable tablet Chew 2 tablets by mouth daily at 12 noon.         Review of Systems  Constitutional: Positive for  fever and diaphoresis.  HENT: Negative for congestion and sore throat.   Eyes: Negative for blurred vision.  Respiratory: Negative for cough and shortness of breath.   Cardiovascular: Negative for chest pain and palpitations.  Gastrointestinal: Positive for abdominal pain. Negative for heartburn, nausea, vomiting, diarrhea and constipation.  Genitourinary: Negative for dysuria, urgency and frequency.  Skin: Negative for rash.  Neurological: Positive for weakness. Negative for dizziness and headaches.   Physical Exam   Blood pressure 133/89, pulse 90, temperature 98.4 F (36.9 C), temperature source Oral, resp. rate 18, height 5' 5.5" (1.664 m), weight 100.472 kg (221 lb 8 oz), last menstrual period 08/26/2013, currently breastfeeding.  Physical Exam  Constitutional: She is oriented to person, place, and time. She appears well-developed and well-nourished. No distress.  HENT:  Head: Normocephalic and atraumatic.  Eyes: EOM are normal.  Neck: Normal range of motion.  Cardiovascular: Normal rate and regular rhythm.   Respiratory: Effort normal and breath sounds normal.  GI: Soft. Bowel sounds are normal. She exhibits no distension. There is tenderness.  Musculoskeletal: Normal range of motion.  Neurological: She is alert and oriented to person, place, and time.  Skin: Skin is warm and dry.  Psychiatric:  She has a normal mood and affect.   Results for orders placed during the hospital encounter of 06/04/14 (from the past 24 hour(s))  URINALYSIS, ROUTINE W REFLEX MICROSCOPIC     Status: Abnormal   Collection Time    06/04/14 11:41 AM      Result Value Ref Range   Color, Urine YELLOW  YELLOW   APPearance CLEAR  CLEAR   Specific Gravity, Urine 1.010  1.005 - 1.030   pH 7.5  5.0 - 8.0   Glucose, UA NEGATIVE  NEGATIVE mg/dL   Hgb urine dipstick MODERATE (*) NEGATIVE   Bilirubin Urine NEGATIVE  NEGATIVE   Ketones, ur NEGATIVE  NEGATIVE mg/dL   Protein, ur NEGATIVE  NEGATIVE mg/dL    Urobilinogen, UA 0.2  0.0 - 1.0 mg/dL   Nitrite NEGATIVE  NEGATIVE   Leukocytes, UA NEGATIVE  NEGATIVE  URINE MICROSCOPIC-ADD ON     Status: None   Collection Time    06/04/14 11:41 AM      Result Value Ref Range   Squamous Epithelial / LPF RARE  RARE   RBC / HPF 3-6  <3 RBC/hpf  CBC WITH DIFFERENTIAL     Status: Abnormal   Collection Time    06/04/14 12:35 PM      Result Value Ref Range   WBC 9.1  4.0 - 10.5 K/uL   RBC 4.21  3.87 - 5.11 MIL/uL   Hemoglobin 12.8  12.0 - 15.0 g/dL   HCT 37.6  36.0 - 46.0 %   MCV 89.3  78.0 - 100.0 fL   MCH 30.4  26.0 - 34.0 pg   MCHC 34.0  30.0 - 36.0 g/dL   RDW 13.7  11.5 - 15.5 %   Platelets 413 (*) 150 - 400 K/uL   Neutrophils Relative % 50  43 - 77 %   Neutro Abs 4.6  1.7 - 7.7 K/uL   Lymphocytes Relative 38  12 - 46 %   Lymphs Abs 3.5  0.7 - 4.0 K/uL   Monocytes Relative 8  3 - 12 %   Monocytes Absolute 0.7  0.1 - 1.0 K/uL   Eosinophils Relative 4  0 - 5 %   Eosinophils Absolute 0.4  0.0 - 0.7 K/uL   Basophils Relative 0  0 - 1 %   Basophils Absolute 0.0  0.0 - 0.1 K/uL   US Transvaginal Non-ob  06/04/2014   CLINICAL DATA:  33 year old female with bleeding status post delivery on 05/21/2014, more recently status post D & C on 06/01/2014. Fever. Query continued retained products of conception. Initial encounter.  EXAM: TRANSVAGINAL ULTRASOUND OF PELVIS  TECHNIQUE: Transvaginal ultrasound examination of the pelvis was performed including evaluation of the uterus, ovaries, adnexal regions, and pelvic cul-de-sac.  COMPARISON:  Preprocedural ultrasound 06/01/2014.  FINDINGS: Uterus  Measurements: 12.3 x 7.3 x 0.4 cm. No fibroids or other mass visualized.  Endometrium  Thickness: 3.2 cm. Remains heterogeneous and enlarged (see image 22). This endometrial heterogeneity does not appear significantly improved since the preprocedural study on 06/01/2014.  Right ovary  Measurements: Not visualized due to overlying bowel gas.  Left ovary  Measurements: 2.7  x 1.9 x 1.5 cm. Normal appearance/no adnexal mass.  Other findings:  No free fluid  IMPRESSION: 1. Endometrial thickening and heterogeneity does not appear improved since 06/01/2014. Query ENDOMETRITIS in this setting, otherwise ongoing retained products of conception not excluded. 2. No free fluid. Right ovary not visible today, left over remains normal.   Electronically  Signed   By: Lars Pinks M.D.   On: 06/04/2014 14:26   US Transvaginal Non-ob  06/01/2014   CLINICAL DATA:  Vaginal bleeding and pain; recent vaginal delivery  EXAM: TRANSABDOMINAL AND TRANSVAGINAL ULTRASOUND OF PELVIS  TECHNIQUE: Study was performed transabdominally to optimize pelvic field of view and transvaginally to optimize internal visceral architecture evaluation.  COMPARISON:  None  FINDINGS: Uterus  Measurements: 12.5 x 7.3 x 8.9 cm consistent with recent pregnancy. No fibroids or other myometrial region mass visualized.  Endometrium  Thickness: 2.8 cm. There is irregular material within the endometrium and increased blood flow, findings concerning for retained products of conception.  Right ovary  Measurements: 1.3 x 1.9 x 1.3 cm. Normal appearance/no adnexal mass.  Left ovary  Measurements: 2.2 x 1.8 x 2.3 cm. Normal appearance/no adnexal mass.  Other findings  No free fluid.  Urinary bladder appears unremarkable.  IMPRESSION: Findings concerning for retained products of conception. Prominence of the uterus is consistent with recent vaginal delivery. No extrauterine pelvic mass or fluid appreciable.  These results will be called to the ordering clinician or representative by the Radiologist Assistant, and communication documented in the PACS or zVision Dashboard.   Electronically Signed   By: Lowella Grip M.D.   On: 06/01/2014 16:45   US Pelvis Complete  06/01/2014   CLINICAL DATA:  Vaginal bleeding and pain; recent vaginal delivery  EXAM: TRANSABDOMINAL AND TRANSVAGINAL ULTRASOUND OF PELVIS  TECHNIQUE: Study was performed  transabdominally to optimize pelvic field of view and transvaginally to optimize internal visceral architecture evaluation.  COMPARISON:  None  FINDINGS: Uterus  Measurements: 12.5 x 7.3 x 8.9 cm consistent with recent pregnancy. No fibroids or other myometrial region mass visualized.  Endometrium  Thickness: 2.8 cm. There is irregular material within the endometrium and increased blood flow, findings concerning for retained products of conception.  Right ovary  Measurements: 1.3 x 1.9 x 1.3 cm. Normal appearance/no adnexal mass.  Left ovary  Measurements: 2.2 x 1.8 x 2.3 cm. Normal appearance/no adnexal mass.  Other findings  No free fluid.  Urinary bladder appears unremarkable.  IMPRESSION: Findings concerning for retained products of conception. Prominence of the uterus is consistent with recent vaginal delivery. No extrauterine pelvic mass or fluid appreciable.  These results will be called to the ordering clinician or representative by the Radiologist Assistant, and communication documented in the PACS or zVision Dashboard.   Electronically Signed   By: Lowella Grip M.D.   On: 06/01/2014 16:45   MAU Course  Procedures  MDM Discussed with Dr. Philis Pique.    Assessment and Plan  A: endometritis vs. Retained products  Plan: 1 liter IV fluids 1mg  Stadol for pain. Dr. Philis Pique to assume care.  Haynes Kerns Clark 06/04/2014, 2:31 PM

## 2014-06-04 NOTE — MAU Note (Signed)
Pt states delivered 2 weeks ago, came back on Tuesday and had D&C. Here today with same s/s as when she needed D&C before. Around midnight began feeling pain in lower abdomen, developed a fever which broke hours later, and passing clots both large and small, with bright red bleeding. Changing pad every time she voids (q2 hours), though not completely saturated.

## 2014-06-04 NOTE — Progress Notes (Signed)
ANTIBIOTIC CONSULT NOTE - INITIAL  Pharmacy Consult for Gentamicin Indication: Presumed endometritis vs. Retained products  No Known Allergies  Patient Measurements: Height: 5' 5.5" (166.4 cm) Weight: 221 lb 8 oz (100.472 kg) IBW/kg (Calculated) : 58.15 Adjusted Body Weight: 70.8kg  Vital Signs: Temp: 98.8 F (37.1 C) (08/14 2039) Temp src: Oral (08/14 1657) BP: 133/80 mmHg (08/14 2039) Pulse Rate: 60 (08/14 2039) Intake/Output from previous day:   Intake/Output from this shift: Total I/O In: 320 [P.O.:220; I.V.:100] Out: -   Labs:  Recent Labs  06/04/14 1235  WBC 9.1  HGB 12.8  PLT 413*   Estimated SCr=0.7 with estimated CrCl > 169ml/min.  CMP ordered for 8/15am.   Microbiology: Recent Results (from the past 720 hour(s))  SURGICAL PCR SCREEN     Status: None   Collection Time    06/01/14  8:31 PM      Result Value Ref Range Status   MRSA, PCR NEGATIVE  NEGATIVE Final   Staphylococcus aureus NEGATIVE  NEGATIVE Final   Comment:            The Xpert SA Assay (FDA     approved for NASAL specimens     in patients over 5 years of age),     is one component of     a comprehensive surveillance     program.  Test performance has     been validated by [employer] for patients greater     than or equal to 54 year old.     It is not intended     to diagnose infection nor to     guide or monitor treatment.    Medical History: Past Medical History  Diagnosis Date  . Medical history non-contributory     Medications:  Ampicillin 2 gram IV q6h. Assessment: 33yo s/p delivery on 7/31 with subsequent D&E on 8/11 for retained products. Pt presents today to MAU with fever, pain and bleeding. Pt has had D&E today with the initiation of Ampicillin and Gentamicin post-op for presumed endometritis vs. Retained products.  Goal of Therapy:  Gentamicin peaks 6-3mcg/ml and trough < 56mcg/ml.  Plan:  1. Gentamicin 160mg  IV q8h. 2. CMP ordered for am so will adjust  dose if necessary based on SCr result. 3. Will continue to follow and draw levels as indicated by clinical status of pt or duration of antibiotics. Thanks!  Vernie Ammons 06/04/2014,9:25 PM

## 2014-06-04 NOTE — Op Note (Signed)
06/04/2014  6:01 PM  PATIENT:  Kelly Buck  33 y.o. female  PRE-OPERATIVE DIAGNOSIS:  retained products  POST-OPERATIVE DIAGNOSIS:  retained products, probable endometritis  PROCEDURE:  Procedure(s): DILATATION AND EVACUATION (N/A)  SURGEON:  Surgeon(s) and Role:    * [name], MD - Primary   ANESTHESIA:   general  EBL:  Total I/O In: 1000 [I.V.:1000] Out: 325 [Urine:275; Blood:50]  SPECIMEN:  Source of Specimen:  uterine contents, culture of uterine contents  DISPOSITION OF SPECIMEN:  PATHOLOGY  COUNTS:  YES  TOURNIQUET:  * No tourniquets in log *  DICTATION: .Note written in EPIC  PLAN OF CARE: Admit to inpatient   PATIENT DISPOSITION:  PACU - hemodynamically stable.   Delay start of Pharmacological VTE agent (>24hrs) due to surgical blood loss or risk of bleeding: not applicable  Medications: Methergine, cytotec  Complications: None  Findings:  13 week size uterus to 12 size post procedure.  Good crie was achieved.  [place] was used before, during and after- removal of tissue and blood seen with probe.  EM had some blood accumulated at the end but much smaller stripe.  After adequate anesthesia was achieved, the patient was prepped and draped in the usual sterile fashion.  The speculum was placed in the vagina and the cervix stabilized with a single-tooth tenaculum.  The cervix was dilated with Kennon Rounds dilators and the 12 mm curette was used to remove contents of the uterus.  Alternating sharp curettage with a curette and suction curettage was performed until all contents were removed and good crie was achieved, all under US guidance.  All instruments were removed from the vagina.  The patient tolerated the procedure well.  Cytotec 1000 mcg administered rectally.    Graiden Henes A

## 2014-06-04 NOTE — Progress Notes (Signed)
Notified of pt's c/o pain and bleeding, orders obtained, midlevel provider to see pt, will cb with results.

## 2014-06-04 NOTE — Progress Notes (Signed)
Dr. Philis Pique called MAU, will come speak to pt to discuss options of care.

## 2014-06-04 NOTE — Anesthesia Postprocedure Evaluation (Signed)
Anesthesia Post Note  Patient: Kelly Buck  Procedure(s) Performed: Procedure(s) (LRB): DILATATION AND EVACUATION (N/A)  Anesthesia type: General  Patient location: PACU  Post pain: Pain level controlled  Post assessment: Post-op Vital signs reviewed  Last Vitals:  Filed Vitals:   06/04/14 1657  BP: 139/80  Pulse: 76  Temp: 37.2 C  Resp: 18    Post vital signs: Reviewed  Level of consciousness: sedated  Complications: No apparent anesthesia complications

## 2014-06-04 NOTE — H&P (Signed)
Chief Complaint   Patient presents with   .  Abdominal Pain   .  Vaginal Bleeding   Per NP: HPI  Kelly Buck 33 y.o.[id number] presents to [facility]U complaining of fever, pelvic pain and bleeding that began at midnight. Prior to this, she noticed significant abrupt onset of fatigue. She presently has 10/10 pain that is described as continuous crampy pain with intermittent sharp pains in bilat lower abdomen - worse on left. Changing position causes pain to improve or worsen - depending. No nausea, vomiting, constipation, diarrhea. She has not eaten today, only coffee and PNV. No Tylenol today - only heating packs.  OB History    Grav  Para  Term  Preterm  Abortions  TAB  SAB  Ect  Mult  Living    5  4  4   1   1    4       Past Medical History   Diagnosis  Date   .  Medical history non-contributory     Past Surgical History   Procedure  Laterality  Date   .  Placement of breast implants     .  Carpal tunnel release     .  Tummy tuck     .  Dilation and evacuation  N/A  06/01/2014     Procedure: DILATATION AND EVACUATION; Surgeon: [name], MD; Location: [facility]; Service: Gynecology; Laterality: N/A;    History reviewed. No pertinent family history.  History   Substance Use Topics   .  Smoking status:  Never Smoker   .  Smokeless tobacco:  Never Used   .  Alcohol Use:  No    Allergies: No Known Allergies  Prescriptions prior to admission   Medication  Sig  Dispense  Refill   .  acetaminophen (TYLENOL) 500 MG tablet  Take 1,000 mg by mouth every 6 (six) hours as needed for moderate pain.     Marland Kitchen  docusate sodium (COLACE) 100 MG capsule  Take 1 capsule (100 mg total) by mouth 2 (two) times daily.  30 capsule  0   .  FENUGREEK PO  Take 1 capsule by mouth daily.     .  prenatal vitamin w/FE, FA (NATACHEW) 29-1 MG CHEW chewable tablet  Chew 2 tablets by mouth daily at 12 noon.      Review of Systems  Constitutional: Positive for fever and diaphoresis.  HENT: Negative for congestion  and sore throat.  Eyes: Negative for blurred vision.  Respiratory: Negative for cough and shortness of breath.  Cardiovascular: Negative for chest pain and palpitations.  Gastrointestinal: Positive for abdominal pain. Negative for heartburn, nausea, vomiting, diarrhea and constipation.  Genitourinary: Negative for dysuria, urgency and frequency.  Skin: Negative for rash.  Neurological: Positive for weakness. Negative for dizziness and headaches.   Physical Exam   Blood pressure 133/89, pulse 90, temperature 98.4 F (36.9 C), temperature source Oral, resp. rate 18, height 5' 5.5" (1.664 m), weight 100.472 kg (221 lb 8 oz), last menstrual period 08/26/2013, currently breastfeeding.  Physical Exam  Constitutional: She is oriented to person, place, and time. She appears well-developed and well-nourished. No distress.  HENT:  Head: Normocephalic and atraumatic.  Eyes: EOM are normal.  Neck: Normal range of motion.  Cardiovascular: Normal rate and regular rhythm.  Respiratory: Effort normal and breath sounds normal.  GI: Soft. Bowel sounds are normal. She exhibits no distension. There is tenderness.  Musculoskeletal: Normal range of motion.  Neurological: She  is alert and oriented to person, place, and time.  Skin: Skin is warm and dry.  Psychiatric: She has a normal mood and affect.   Results for orders placed during the hospital encounter of 06/04/14 (from the past 24 hour(s))   URINALYSIS, ROUTINE W REFLEX MICROSCOPIC Status: Abnormal    Collection Time    06/04/14 11:41 AM   Result  Value  Ref Range    Color, Urine  YELLOW  YELLOW    APPearance  CLEAR  CLEAR    Specific Gravity, Urine  1.010  1.005 - 1.030    pH  7.5  5.0 - 8.0    Glucose, UA  NEGATIVE  NEGATIVE mg/dL    Hgb urine dipstick  MODERATE (*)  NEGATIVE    Bilirubin Urine  NEGATIVE  NEGATIVE    Ketones, ur  NEGATIVE  NEGATIVE mg/dL    Protein, ur  NEGATIVE  NEGATIVE mg/dL    Urobilinogen, UA  0.2  0.0 - 1.0 mg/dL     Nitrite  NEGATIVE  NEGATIVE    Leukocytes, UA  NEGATIVE  NEGATIVE   URINE MICROSCOPIC-ADD ON Status: None    Collection Time    06/04/14 11:41 AM   Result  Value  Ref Range    Squamous Epithelial / LPF  RARE  RARE    RBC / HPF  3-6  <3 RBC/hpf   CBC WITH DIFFERENTIAL Status: Abnormal    Collection Time    06/04/14 12:35 PM   Result  Value  Ref Range    WBC  9.1  4.0 - 10.5 K/uL    RBC  4.21  3.87 - 5.11 MIL/uL    Hemoglobin  12.8  12.0 - 15.0 g/dL    HCT  37.6  36.0 - 46.0 %    MCV  89.3  78.0 - 100.0 fL    MCH  30.4  26.0 - 34.0 pg    MCHC  34.0  30.0 - 36.0 g/dL    RDW  13.7  11.5 - 15.5 %    Platelets  413 (*)  150 - 400 K/uL    Neutrophils Relative %  50  43 - 77 %    Neutro Abs  4.6  1.7 - 7.7 K/uL    Lymphocytes Relative  38  12 - 46 %    Lymphs Abs  3.5  0.7 - 4.0 K/uL    Monocytes Relative  8  3 - 12 %    Monocytes Absolute  0.7  0.1 - 1.0 K/uL    Eosinophils Relative  4  0 - 5 %    Eosinophils Absolute  0.4  0.0 - 0.7 K/uL    Basophils Relative  0  0 - 1 %    Basophils Absolute  0.0  0.0 - 0.1 K/uL    US Transvaginal Non-ob  06/04/2014 CLINICAL DATA: 33 year old female with bleeding status post delivery on 05/21/2014, more recently status post D & C on 06/01/2014. Fever. Query continued retained products of conception. Initial encounter. EXAM: TRANSVAGINAL ULTRASOUND OF PELVIS TECHNIQUE: Transvaginal ultrasound examination of the pelvis was performed including evaluation of the uterus, ovaries, adnexal regions, and pelvic cul-de-sac. COMPARISON: Preprocedural ultrasound 06/01/2014. FINDINGS: Uterus Measurements: 12.3 x 7.3 x 0.4 cm. No fibroids or other mass visualized. Endometrium Thickness: 3.2 cm. Remains heterogeneous and enlarged (see image 22). This endometrial heterogeneity does not appear significantly improved since the preprocedural study on 06/01/2014. Right ovary Measurements: Not visualized due to overlying bowel gas.  Left ovary Measurements: 2.7 x 1.9 x 1.5 cm.  Normal appearance/no adnexal mass. Other findings: No free fluid IMPRESSION: 1. Endometrial thickening and heterogeneity does not appear improved since 06/01/2014. Query ENDOMETRITIS in this setting, otherwise ongoing retained products of conception not excluded. 2. No free fluid. Right ovary not visible today, left over remains normal. Electronically Signed By: Lars Pinks M.D. On: 06/04/2014 14:26  US Transvaginal Non-ob  06/01/2014 CLINICAL DATA: Vaginal bleeding and pain; recent vaginal delivery EXAM: TRANSABDOMINAL AND TRANSVAGINAL ULTRASOUND OF PELVIS TECHNIQUE: Study was performed transabdominally to optimize pelvic field of view and transvaginally to optimize internal visceral architecture evaluation. COMPARISON: None FINDINGS: Uterus Measurements: 12.5 x 7.3 x 8.9 cm consistent with recent pregnancy. No fibroids or other myometrial region mass visualized. Endometrium Thickness: 2.8 cm. There is irregular material within the endometrium and increased blood flow, findings concerning for retained products of conception. Right ovary Measurements: 1.3 x 1.9 x 1.3 cm. Normal appearance/no adnexal mass. Left ovary Measurements: 2.2 x 1.8 x 2.3 cm. Normal appearance/no adnexal mass. Other findings No free fluid. Urinary bladder appears unremarkable. IMPRESSION: Findings concerning for retained products of conception. Prominence of the uterus is consistent with recent vaginal delivery. No extrauterine pelvic mass or fluid appreciable. These results will be called to the ordering clinician or representative by the Radiologist Assistant, and communication documented in the PACS or zVision Dashboard. Electronically Signed By: Lowella Grip M.D. On: 06/01/2014 16:45  US Pelvis Complete  06/01/2014 CLINICAL DATA: Vaginal bleeding and pain; recent vaginal delivery EXAM: TRANSABDOMINAL AND TRANSVAGINAL ULTRASOUND OF PELVIS TECHNIQUE: Study was performed transabdominally to optimize pelvic field of view and transvaginally  to optimize internal visceral architecture evaluation. COMPARISON: None FINDINGS: Uterus Measurements: 12.5 x 7.3 x 8.9 cm consistent with recent pregnancy. No fibroids or other myometrial region mass visualized. Endometrium Thickness: 2.8 cm. There is irregular material within the endometrium and increased blood flow, findings concerning for retained products of conception. Right ovary Measurements: 1.3 x 1.9 x 1.3 cm. Normal appearance/no adnexal mass. Left ovary Measurements: 2.2 x 1.8 x 2.3 cm. Normal appearance/no adnexal mass. Other findings No free fluid. Urinary bladder appears unremarkable. IMPRESSION: Findings concerning for retained products of conception. Prominence of the uterus is consistent with recent vaginal delivery. No extrauterine pelvic mass or fluid appreciable. These results will be called to the ordering clinician or representative by the Radiologist Assistant, and communication documented in the PACS or zVision Dashboard. Electronically Signed By: Lowella Grip M.D. On: 06/01/2014 16:45  MAU Course   Procedures  MDM  Discussed with Dr. Philis Pique.  Assessment and Plan   A: endometritis vs. Retained products  Plan: 1 liter IV fluids  1mg  Stadol for pain.  I agree with above. D/w Rads- 3 cm fundal echogenicity still in place although less blood flow.  Pt had fever last night but is afebrile now.  Probable continued retained products and now evolving endometritis.  I d/w the pt the following:  1. Offered pt epidural with cytotec to expel products vs D&E under general anesthesia and with ultrasound guidance.  Pt strongly desires D&E with ultrasound.  Pt has been NPO since 8 am.    2.  Will admit pt for antibiotics for 48 hours for presumed endometritis.  Although afeb now and WBC is low, pt is likely infected secondary flu like sx, probable fever last night and length of time poc has been retained.  3.  D/w pt all R/B/Alt of surgery- pt wishes to proceed.  Also d/w pt  possibility of accreta which is the reason for persistantly retained products.  D/W pt if unable to remove products and suspect acreta, would not do hyst today but set up for a day case with assistance this coming week.

## 2014-06-05 LAB — COMPREHENSIVE METABOLIC PANEL
ALBUMIN: 3 g/dL — AB (ref 3.5–5.2)
ALK PHOS: 53 U/L (ref 39–117)
ALT: 23 U/L (ref 0–35)
ANION GAP: 16 — AB (ref 5–15)
AST: 19 U/L (ref 0–37)
BILIRUBIN TOTAL: 0.2 mg/dL — AB (ref 0.3–1.2)
BUN: 8 mg/dL (ref 6–23)
CHLORIDE: 101 meq/L (ref 96–112)
CO2: 22 meq/L (ref 19–32)
Calcium: 8.9 mg/dL (ref 8.4–10.5)
Creatinine, Ser: 0.68 mg/dL (ref 0.50–1.10)
GFR calc Af Amer: >90 mL/min (ref >90)
GLUCOSE: 106 mg/dL — AB (ref 70–99)
POTASSIUM: 4.2 meq/L (ref 3.7–5.3)
Sodium: 139 mEq/L (ref 137–147)
Total Protein: 6 g/dL (ref 6.0–8.3)

## 2014-06-05 LAB — CBC
HEMATOCRIT: 34.7 % — AB (ref 36.0–46.0)
HEMOGLOBIN: 11.6 g/dL — AB (ref 12.0–15.0)
MCH: 29.8 pg (ref 26.0–34.0)
MCHC: 33.4 g/dL (ref 30.0–36.0)
MCV: 89.2 fL (ref 78.0–100.0)
PLATELETS: 409 10*3/uL — AB (ref 150–400)
RBC: 3.89 MIL/uL (ref 3.87–5.11)
RDW: 13.6 % (ref 11.5–15.5)
WBC: 9.1 10*3/uL (ref 4.0–10.5)

## 2014-06-05 LAB — HCG, QUANTITATIVE, PREGNANCY: hCG, Beta Chain, Quant, S: 3 m[IU]/mL (ref <5)

## 2014-06-05 MED ORDER — CLINDAMYCIN PHOSPHATE 900 MG/50ML IV SOLN
900.0000 mg | Freq: Three times a day (TID) | INTRAVENOUS | Status: DC
  Administered 2014-06-05 – 2014-06-07 (×5): 900 mg via INTRAVENOUS
  Filled 2014-06-05 (×7): qty 50

## 2014-06-05 MED ORDER — KETOROLAC TROMETHAMINE 30 MG/ML IJ SOLN
30.0000 mg | Freq: Four times a day (QID) | INTRAMUSCULAR | Status: DC
  Administered 2014-06-06 (×3): 30 mg via INTRAVENOUS
  Filled 2014-06-05 (×3): qty 1

## 2014-06-05 NOTE — Consult Note (Signed)
Lactation consult with this mom admitted for removal of uterine clots, . Baby is two weeks old, and mom had placental fragment removed at 11 days  Post partum. She has been breast feeding, but has a low milk supply. She did not pump or breast feed since yesterday, . She was told to pump and dump for twice by her anesthesiologist. . I assited her with lataching baby in cross cradle hold. The baby had recently been fed 2 ounces of formula, and spit up large amount. She di latch ans suckled for about 10 minutes. I then set up a DEP for mom, and advised ehr to try and pump about every 3 hours, to protect eherupply and supplement the baby with EBM. Mom has large, full breasts, but very wide set. She has 3 other children, and said due to lack fo support, was not able to breast feed them. She is also supplementing this baby with a friends EBM. This baby ost over a pound since birth, but according to mom is beginning to regain weight. I told mom I would try and reweigh her baby for her later today. Mom knows to call for questions/concerns.

## 2014-06-05 NOTE — Progress Notes (Signed)
Patient is eating, ambulating, and voiding.  Pain control is good and she feels better.    BP 123/66  Pulse 57  Temp(Src) 98.5 F (36.9 C) (Oral)  Resp 18  Ht 5' 5.5" (1.664 m)  Wt 100.472 kg (221 lb 8 oz)  BMI 36.29 kg/m2  SpO2 99%  Breastfeeding? Yes  Filed Vitals:   06/04/14 1935 06/04/14 2039 06/05/14 0116 06/05/14 0622  BP: 150/85 133/80 103/57 123/66  Pulse: 65 60 60 57  Temp: 99 F (37.2 C) 98.8 F (37.1 C) 97.3 F (36.3 C) 98.5 F (36.9 C)  TempSrc:   Oral Oral  Resp: 20 18 18 18   Height:      Weight:      SpO2: 99% 95% 100% 99%   lungs:   clear to auscultation cor:    RRR Abdomen:  soft, appropriate tenderness, incisions intact and without erythema or exudate. ex:    no cords   Results for orders placed during the hospital encounter of 06/04/14 (from the past 24 hour(s))  URINALYSIS, ROUTINE W REFLEX MICROSCOPIC     Status: Abnormal   Collection Time    06/04/14 11:41 AM      Result Value Ref Range   Color, Urine YELLOW  YELLOW   APPearance CLEAR  CLEAR   Specific Gravity, Urine 1.010  1.005 - 1.030   pH 7.5  5.0 - 8.0   Glucose, UA NEGATIVE  NEGATIVE mg/dL   Hgb urine dipstick MODERATE (*) NEGATIVE   Bilirubin Urine NEGATIVE  NEGATIVE   Ketones, ur NEGATIVE  NEGATIVE mg/dL   Protein, ur NEGATIVE  NEGATIVE mg/dL   Urobilinogen, UA 0.2  0.0 - 1.0 mg/dL   Nitrite NEGATIVE  NEGATIVE   Leukocytes, UA NEGATIVE  NEGATIVE  URINE MICROSCOPIC-ADD ON     Status: None   Collection Time    06/04/14 11:41 AM      Result Value Ref Range   Squamous Epithelial / LPF RARE  RARE   RBC / HPF 3-6  <3 RBC/hpf  CBC WITH DIFFERENTIAL     Status: Abnormal   Collection Time    06/04/14 12:35 PM      Result Value Ref Range   WBC 9.1  4.0 - 10.5 K/uL   RBC 4.21  3.87 - 5.11 MIL/uL   Hemoglobin 12.8  12.0 - 15.0 g/dL   HCT 37.6  36.0 - 46.0 %   MCV 89.3  78.0 - 100.0 fL   MCH 30.4  26.0 - 34.0 pg   MCHC 34.0  30.0 - 36.0 g/dL   RDW 13.7  11.5 - 15.5 %   Platelets 413  (*) 150 - 400 K/uL   Neutrophils Relative % 50  43 - 77 %   Neutro Abs 4.6  1.7 - 7.7 K/uL   Lymphocytes Relative 38  12 - 46 %   Lymphs Abs 3.5  0.7 - 4.0 K/uL   Monocytes Relative 8  3 - 12 %   Monocytes Absolute 0.7  0.1 - 1.0 K/uL   Eosinophils Relative 4  0 - 5 %   Eosinophils Absolute 0.4  0.0 - 0.7 K/uL   Basophils Relative 0  0 - 1 %   Basophils Absolute 0.0  0.0 - 0.1 K/uL  ANAEROBIC CULTURE     Status: None   Collection Time    06/04/14  6:04 PM      Result Value Ref Range   Specimen Description UTERUS CONTENTS  Special Requests NONE     Gram Stain       Value: NO WBC SEEN     NO SQUAMOUS EPITHELIAL CELLS SEEN     NO ORGANISMS SEEN     Performed at [facility]   Culture PENDING     Report Status PENDING    WOUND CULTURE     Status: None   Collection Time    06/04/14  6:04 PM      Result Value Ref Range   Specimen Description UTERUS CONTENTS     Special Requests NONE     Gram Stain       Value: NO WBC SEEN     NO SQUAMOUS EPITHELIAL CELLS SEEN     NO ORGANISMS SEEN     Performed at [facility]   Culture       Value: NO GROWTH 1 DAY     Performed at [facility]   Report Status PENDING    CBC     Status: Abnormal   Collection Time    06/05/14  5:27 AM      Result Value Ref Range   WBC 9.1  4.0 - 10.5 K/uL   RBC 3.89  3.87 - 5.11 MIL/uL   Hemoglobin 11.6 (*) 12.0 - 15.0 g/dL   HCT 34.7 (*) 36.0 - 46.0 %   MCV 89.2  78.0 - 100.0 fL   MCH 29.8  26.0 - 34.0 pg   MCHC 33.4  30.0 - 36.0 g/dL   RDW 13.6  11.5 - 15.5 %   Platelets 409 (*) 150 - 400 K/uL  HCG, QUANTITATIVE, PREGNANCY     Status: None   Collection Time    06/05/14  5:27 AM      Result Value Ref Range   hCG, Beta Chain, Quant, S 3  <5 mIU/mL  COMPREHENSIVE METABOLIC PANEL     Status: Abnormal   Collection Time    06/05/14  5:27 AM      Result Value Ref Range   Sodium 139  137 - 147 mEq/L   Potassium 4.2  3.7 - 5.3 mEq/L   Chloride 101  96 - 112 mEq/L   CO2 22   19 - 32 mEq/L   Glucose, Bld 106 (*) 70 - 99 mg/dL   BUN 8  6 - 23 mg/dL   Creatinine, Ser 0.68  0.50 - 1.10 mg/dL   Calcium 8.9  8.4 - 10.5 mg/dL   Total Protein 6.0  6.0 - 8.3 g/dL   Albumin 3.0 (*) 3.5 - 5.2 g/dL   AST 19  0 - 37 U/L   ALT 23  0 - 35 U/L   Alkaline Phosphatase 53  39 - 117 U/L   Total Bilirubin 0.2 (*) 0.3 - 1.2 mg/dL   GFR calc non Af Amer >90  >90 mL/min   GFR calc Af Amer >90  >90 mL/min   Anion gap 16 (*) 5 - 15    A:  Pt with increased bleeding and pain 3 days post op path proven removal of retained placenta.  US showed a 3 cm area in the uterus without blood flow that appeared unchanged from preop last Tuesday- I d/w this with the radiologist and although he said it could be infection or blood, he felt it still was c/w retained products.  After d/w pt she elected to undergo another D&E.  D&E specimen removed the material under US guidance and visual inspection did  not reveal any tissue- this was sent for path and for culture.  Although pt was afebrile here and her WBC was normal, her pain and report of fever and chills from the night before warrants treatment for endometritis.    I discussed the above and the surgical findings with pt and reassured her that she should start feeling better although she may have some pain from cramping as there was some residual endometrial bleeding.  I encouraged her to walk at least 3-4 times per day.  I will continue antibiotics for at least 48 hours if remains afebrile.  Uterine path pending.  Culture anaerobe and aerobe are no growth one day and no organisms seen.

## 2014-06-05 NOTE — Progress Notes (Signed)
Called to patient's bedside for evaluation.  Patient states she is having worsening abdominal pain, notes her "uterus is burning".  She feels "worse". She denies fever or chills, no nausea or vomiting.  She is out of bed, voiding w/o difficulty and passing flatus.   Vitals reviewed and current temp 98.3 Gen: AAOx 3 mild distress ABD: soft, fundal tenderness no rebound, voluntary guarding.  GU: No vaginal bleeding  A/P: Patient is POD #1 HD#2 vs/p D&C for retained POC and endomyometritis.  We discussed that with instrumentation of the uterus she will have discomfort.  Will give Toradol instead of motrin for next NSAID dose.  Will add Clindamycin 900mg  IV q8hrs for more broad spectrum coverage Patient reassured that her procedure was ultrasound guided and there are no more retained POC.   Kelly Buck [name]

## 2014-06-06 ENCOUNTER — Inpatient Hospital Stay (HOSPITAL_COMMUNITY): Payer: Medicaid Other

## 2014-06-06 ENCOUNTER — Encounter (HOSPITAL_COMMUNITY): Payer: Self-pay | Admitting: Radiology

## 2014-06-06 LAB — WOUND CULTURE
Culture: NO GROWTH
Gram Stain: NONE SEEN

## 2014-06-06 MED ORDER — POLYETHYLENE GLYCOL 3350 17 G PO PACK
17.0000 g | PACK | Freq: Every day | ORAL | Status: DC
  Administered 2014-06-06: 17 g via ORAL
  Filled 2014-06-06 (×2): qty 1

## 2014-06-06 MED ORDER — IOHEXOL 300 MG/ML  SOLN
100.0000 mL | Freq: Once | INTRAMUSCULAR | Status: AC | PRN
  Administered 2014-06-06: 100 mL via INTRAVENOUS

## 2014-06-06 MED ORDER — POLYETHYLENE GLYCOL 3350 17 G PO PACK
17.0000 g | PACK | Freq: Every day | ORAL | Status: DC
  Filled 2014-06-06: qty 1

## 2014-06-06 NOTE — Progress Notes (Signed)
2 Days Post-Op Procedure(s) (LRB): DILATATION AND EVACUATION (N/A) for post partum retained POC.  HD#3 for endomyometritis ABX Day #3  Subjective: Patient states that she feels no better has severe abdominal pain. She reports nausea this morning, no vomiting.  She has eaten breakfast. No abnormal bleeding, no vaginal discharge, no subjective fevers or chills.  She wants imaging of her abdomen and uterus to ensure "everything is ok"     Objective: I have reviewed patient's vital signs, intake and output, medications, labs and microbiology. Filed Vitals:   06/06/14 0626  BP: 133/78  Pulse: 66  Temp: 98 F (36.7 C)  Resp: 16   General: alert, cooperative and no distress GI: soft, appropriate TTP at fundus, other quadrants soft, no guarding with stethoscope press but voluntary guarding with hand. No rebound. bowel sounds normal;  Extremities: extremities normal, atraumatic, no cyanosis or edema, Homans sign is negative, no sign of DVT and no edema, redness or tenderness in the calves or thighs Vaginal Bleeding: minimal  ANAEROBIC CULTURE     Status: None   Collection Time    06/04/14  6:04 PM      Result Value Ref Range   Specimen Description UTERUS CONTENTS     Special Requests NONE     Gram Stain       Value: NO WBC SEEN     NO SQUAMOUS EPITHELIAL CELLS SEEN     NO ORGANISMS SEEN     Performed at [facility]   Culture       Value: NO ANAEROBES ISOLATED; CULTURE IN PROGRESS FOR 5 DAYS     Performed at [facility]   Report Status PENDING    WOUND CULTURE     Status: None   Collection Time    06/04/14  6:04 PM      Result Value Ref Range   Specimen Description UTERUS CONTENTS     Special Requests NONE     Gram Stain       Value: NO WBC SEEN     NO SQUAMOUS EPITHELIAL CELLS SEEN     NO ORGANISMS SEEN     Performed at [facility]   Culture       Value: NO GROWTH 2 DAYS     Performed at [facility]   Report Status 06/06/2014 FINAL     CBC     Status: Abnormal   Collection Time    06/05/14  5:27 AM      Result Value Ref Range   WBC 9.1  4.0 - 10.5 K/uL   RBC 3.89  3.87 - 5.11 MIL/uL   Hemoglobin 11.6 (*) 12.0 - 15.0 g/dL   HCT 34.7 (*) 36.0 - 46.0 %   MCV 89.2  78.0 - 100.0 fL   MCH 29.8  26.0 - 34.0 pg   MCHC 33.4  30.0 - 36.0 g/dL   RDW 13.6  11.5 - 15.5 %   Platelets 409 (*) 150 - 400 K/uL  COMPREHENSIVE METABOLIC PANEL     Status: Abnormal   Collection Time    06/05/14  5:27 AM      Result Value Ref Range   Sodium 139  137 - 147 mEq/L   Potassium 4.2  3.7 - 5.3 mEq/L   Chloride 101  96 - 112 mEq/L   CO2 22  19 - 32 mEq/L   Glucose, Bld 106 (*) 70 - 99 mg/dL   BUN 8  6 - 23 mg/dL  Creatinine, Ser 0.68  0.50 - 1.10 mg/dL   Calcium 8.9  8.4 - 10.5 mg/dL   Total Protein 6.0  6.0 - 8.3 g/dL   Albumin 3.0 (*) 3.5 - 5.2 g/dL   AST 19  0 - 37 U/L   ALT 23  0 - 35 U/L   Alkaline Phosphatase 53  39 - 117 U/L   Total Bilirubin 0.2 (*) 0.3 - 1.2 mg/dL   GFR calc non Af Amer >90  >90 mL/min   GFR calc Af Amer >90  >90 mL/min   Comment: (NOTE)     The eGFR has been calculated using the CKD EPI equation.     This calculation has not been validated in all clinical situations.     eGFR's persistently <90 mL/min signify possible Chronic Kidney     Disease.   Anion gap 16 (*) 5 - 15     Assessment: Patient is HD 3 re-admission for endomyometritis possible retained POC, POD#2 s/p D&C  Patient notes pain not improved, her abdomen is not surgical, appropriate tenderness (no guarding or rebound with stethoscope but was present when hand placed)  Patient vitals normal, afebrile, non-tachycardic, No growth in wound culture. Only sign of possible  Endometritis is fundal tenderness.     We discussed lactation at length and that all medications including antibiotics are given routinely  To ladies that are pregnant and are safe in lactation.   Patient reassured and counseled about healing post partum and it will  take time for her to recover.    Plan: Patient counseled again for >30 minutes that her pain is normal, however she insists on imaging.  Will order CT scan Abdomen  / pelvis instead of pelvic sonogram  Continue triple antibiotic therapy until d/c'd by Dr. Philis Pique.  Will transition patient back to PO NSAIDs tonight.  Counseled her to take 2 percocet instead of one.   LOS: 2 days    Yael Coppess, [place] 06/06/2014, 11:42 AM

## 2014-06-07 ENCOUNTER — Encounter (HOSPITAL_COMMUNITY): Payer: Self-pay | Admitting: Obstetrics and Gynecology

## 2014-06-07 LAB — CLOSTRIDIUM DIFFICILE BY PCR: CDIFFPCR: NEGATIVE

## 2014-06-07 MED ORDER — DOCUSATE SODIUM 283 MG RE ENEM
1.0000 | ENEMA | Freq: Every day | RECTAL | Status: DC
  Filled 2014-06-07: qty 1

## 2014-06-07 MED ORDER — FLEET ENEMA 7-19 GM/118ML RE ENEM
1.0000 | ENEMA | Freq: Once | RECTAL | Status: AC
  Administered 2014-06-07: 1 via RECTAL

## 2014-06-07 NOTE — Discharge Summary (Signed)
Physician Discharge Summary  Patient ID: Kelly Buck MRN: [id number] DOB/AGE: 1981/08/31 33 y.o.  Admit date: 06/04/2014 Discharge date: 06/07/2014  Admission Diagnoses:Possible retained placenta, post partum endometritis  Discharge Diagnoses: Probable early pp endometritis, severe constipation Active Problems:   Postpartum endometritis   Discharged Condition: good  Hospital Course: Pt seen in MAU for returned pain 2 days after D&E for path proven retained products.  [place] indicated that there was no change from the preop [place] before the D&E and the concern was that there was still retained products per Radiology.  Another D&E was performed and no products were seen in specimen.  Specimen was sent for culture as well.  Although pt's WBC and temp were normal on admission, she c/o a fever the night before and pt still had significant pain so she was treated for presumptive early pp endometritis.  Pt's post op course has been complicated only from pt's perception of pain and not feeling well- her vitals, labs and exam have all remained normal pp.  She has not had excessive bleeding.  A CT was done yesterday secondary pt's continued c/o pain and showed still some signs of blood in EM but no other pathology in the abdomen except for a 9 cm dilated colon with stool.  Specimen cultures are still all no growth and no organisms were seen on stain.  Pt was encouraged to stop narcotics, given Miralax and encouraged to walk more.  Pt was not sent home with antibiotics secondary cultures were negative and I have a concern that she can develop C Diff.  HCG was also negative r/o any persistence of any POC.  Consults: None  Significant Diagnostic Studies: labs:   Results for orders placed during the hospital encounter of 06/04/14 (from the past 72 hour(s))  URINALYSIS, ROUTINE W REFLEX MICROSCOPIC     Status: Abnormal   Collection Time    06/04/14 11:41 AM      Result Value Ref Range   Color, Urine YELLOW   YELLOW   APPearance CLEAR  CLEAR   Specific Gravity, Urine 1.010  1.005 - 1.030   pH 7.5  5.0 - 8.0   Glucose, UA NEGATIVE  NEGATIVE mg/dL   Hgb urine dipstick MODERATE (*) NEGATIVE   Bilirubin Urine NEGATIVE  NEGATIVE   Ketones, ur NEGATIVE  NEGATIVE mg/dL   Protein, ur NEGATIVE  NEGATIVE mg/dL   Urobilinogen, UA 0.2  0.0 - 1.0 mg/dL   Nitrite NEGATIVE  NEGATIVE   Leukocytes, UA NEGATIVE  NEGATIVE  URINE MICROSCOPIC-ADD ON     Status: None   Collection Time    06/04/14 11:41 AM      Result Value Ref Range   Squamous Epithelial / LPF RARE  RARE   RBC / HPF 3-6  <3 RBC/hpf  CBC WITH DIFFERENTIAL     Status: Abnormal   Collection Time    06/04/14 12:35 PM      Result Value Ref Range   WBC 9.1  4.0 - 10.5 K/uL   RBC 4.21  3.87 - 5.11 MIL/uL   Hemoglobin 12.8  12.0 - 15.0 g/dL   HCT 37.6  36.0 - 46.0 %   MCV 89.3  78.0 - 100.0 fL   MCH 30.4  26.0 - 34.0 pg   MCHC 34.0  30.0 - 36.0 g/dL   RDW 13.7  11.5 - 15.5 %   Platelets 413 (*) 150 - 400 K/uL   Neutrophils Relative % 50  43 - 77 %  Neutro Abs 4.6  1.7 - 7.7 K/uL   Lymphocytes Relative 38  12 - 46 %   Lymphs Abs 3.5  0.7 - 4.0 K/uL   Monocytes Relative 8  3 - 12 %   Monocytes Absolute 0.7  0.1 - 1.0 K/uL   Eosinophils Relative 4  0 - 5 %   Eosinophils Absolute 0.4  0.0 - 0.7 K/uL   Basophils Relative 0  0 - 1 %   Basophils Absolute 0.0  0.0 - 0.1 K/uL  ANAEROBIC CULTURE     Status: None   Collection Time    06/04/14  6:04 PM      Result Value Ref Range   Specimen Description UTERUS CONTENTS     Special Requests NONE     Gram Stain       Value: NO WBC SEEN     NO SQUAMOUS EPITHELIAL CELLS SEEN     NO ORGANISMS SEEN     Performed at [facility]   Culture       Value: NO ANAEROBES ISOLATED; CULTURE IN PROGRESS FOR 5 DAYS     Performed at [facility]   Report Status PENDING    WOUND CULTURE     Status: None   Collection Time    06/04/14  6:04 PM      Result Value Ref Range   Specimen  Description UTERUS CONTENTS     Special Requests NONE     Gram Stain       Value: NO WBC SEEN     NO SQUAMOUS EPITHELIAL CELLS SEEN     NO ORGANISMS SEEN     Performed at [facility]   Culture       Value: NO GROWTH 2 DAYS     Performed at [facility]   Report Status 06/06/2014 FINAL    CBC     Status: Abnormal   Collection Time    06/05/14  5:27 AM      Result Value Ref Range   WBC 9.1  4.0 - 10.5 K/uL   RBC 3.89  3.87 - 5.11 MIL/uL   Hemoglobin 11.6 (*) 12.0 - 15.0 g/dL   HCT 34.7 (*) 36.0 - 46.0 %   MCV 89.2  78.0 - 100.0 fL   MCH 29.8  26.0 - 34.0 pg   MCHC 33.4  30.0 - 36.0 g/dL   RDW 13.6  11.5 - 15.5 %   Platelets 409 (*) 150 - 400 K/uL  HCG, QUANTITATIVE, PREGNANCY     Status: None   Collection Time    06/05/14  5:27 AM      Result Value Ref Range   hCG, Beta Chain, Quant, S 3  <5 mIU/mL   Comment:              GEST. AGE      CONC.  (mIU/mL)       <=1 WEEK        5 - 50         2 WEEKS       50 - 500         3 WEEKS       100 - 10,000         4 WEEKS     1,000 - 30,000         5 WEEKS     3,500 - 115,000       6-8 WEEKS     12,000 -  270,000        12 WEEKS     15,000 - 220,000                FEMALE AND NON-PREGNANT FEMALE:         LESS THAN 5 mIU/mL  COMPREHENSIVE METABOLIC PANEL     Status: Abnormal   Collection Time    06/05/14  5:27 AM      Result Value Ref Range   Sodium 139  137 - 147 mEq/L   Potassium 4.2  3.7 - 5.3 mEq/L   Chloride 101  96 - 112 mEq/L   CO2 22  19 - 32 mEq/L   Glucose, Bld 106 (*) 70 - 99 mg/dL   BUN 8  6 - 23 mg/dL   Creatinine, Ser 0.68  0.50 - 1.10 mg/dL   Calcium 8.9  8.4 - 10.5 mg/dL   Total Protein 6.0  6.0 - 8.3 g/dL   Albumin 3.0 (*) 3.5 - 5.2 g/dL   AST 19  0 - 37 U/L   ALT 23  0 - 35 U/L   Alkaline Phosphatase 53  39 - 117 U/L   Total Bilirubin 0.2 (*) 0.3 - 1.2 mg/dL   GFR calc non Af Amer >90  >90 mL/min   GFR calc Af Amer >90  >90 mL/min   Comment: (NOTE)     The eGFR has been calculated using  the CKD EPI equation.     This calculation has not been validated in all clinical situations.     eGFR's persistently <90 mL/min signify possible Chronic Kidney     Disease.   Anion gap 16 (*) 5 - 15    Treatments: see other d/c summ  Discharge Exam: Blood pressure 107/71, pulse 68, temperature 97.8 F (36.6 C), temperature source Oral, resp. rate 16, height 5' 5.5" (1.664 m), weight 100.472 kg (221 lb 8 oz), SpO2 100.00%, currently breastfeeding.   Disposition: 01-Home or Self Care  Discharge Instructions   Call MD for:  temperature >100.4    Complete by:  As directed      Diet - low sodium heart healthy    Complete by:  As directed      Discharge instructions    Complete by:  As directed   No driving on narcotics, no sexual activity for 2 weeks.     Increase activity slowly    Complete by:  As directed      May shower / Bathe    Complete by:  As directed   Shower, no bath for 2 weeks.     Remove dressing in 24 hours    Complete by:  As directed      Sexual Activity Restrictions    Complete by:  As directed   No sexual activity for 2 weeks.            Medication List         acetaminophen 500 MG tablet  Commonly known as:  TYLENOL  Take 1,000 mg by mouth every 6 (six) hours as needed for moderate pain.     docusate sodium 100 MG capsule  Commonly known as:  COLACE  Take 1 capsule (100 mg total) by mouth 2 (two) times daily.     FENUGREEK PO  Take 1 capsule by mouth daily.     prenatal vitamin w/FE, FA 29-1 MG Chew chewable tablet  Chew 2 tablets by mouth daily at 12 noon.  Follow-up Information   Follow up with Wilbur Oakland A, MD In 2 weeks.   Specialty:  Obstetrics and Gynecology   Contact information:   [address] [place] [address] [phone]       Signed: Nieve Rojero A 06/07/2014, 7:57 AM

## 2014-06-07 NOTE — Progress Notes (Signed)
I visited with pt and her family prior to discharge.  They were feeling relieved after such a long process to be going home soon.  They were grateful for the extra support.  I offered reflective listening as they told me about their experience since their baby's birth.  [employer] Pager, [phone] 3:25 PM   06/07/14 1500  Clinical Encounter Type  Visited With Patient and family together  Visit Type Spiritual support

## 2014-06-07 NOTE — Progress Notes (Signed)
Ur chart review completed.  

## 2014-06-07 NOTE — Progress Notes (Signed)
Patient is eating, ambulating, and voiding.  Pain control is fair but pt is very constipated  BP 107/71  Pulse 68  Temp(Src) 97.8 F (36.6 C) (Oral)  Resp 16  Ht 5' 5.5" (1.664 m)  Wt 100.472 kg (221 lb 8 oz)  BMI 36.29 kg/m2  SpO2 100%  Breastfeeding? Yes  lungs:   clear to auscultation cor:    RRR Abdomen:  soft, appropriate tenderness, incisions intact and without erythema or exudate. ex:    no cords   Lab Results  Component Value Date   WBC 9.1 06/05/2014   HGB 11.6* 06/05/2014   HCT 34.7* 06/05/2014   MCV 89.2 06/05/2014   PLT 409* 06/05/2014    A/P POD 3 second D&E for presumed retained products.  Cultures of products are all negative.  Pt has been afebrile.  Although pt still c/o pain, CT shows no new pathology but severe constipation.  Pt has has >48 of IV antibiotics for endometritis.    Expect d/c later today.  Pt given several doses of Miralax and encouraged to stop narcotics.  Will consider enema.  Will NOT send home with antibiotics secondary risk of C diff colitis.

## 2014-06-09 ENCOUNTER — Ambulatory Visit (HOSPITAL_COMMUNITY): Admission: RE | Admit: 2014-06-09 | Payer: Medicaid Other | Source: Ambulatory Visit

## 2014-06-09 LAB — ANAEROBIC CULTURE: GRAM STAIN: NONE SEEN

## 2014-06-11 ENCOUNTER — Ambulatory Visit (HOSPITAL_COMMUNITY)
Admission: RE | Admit: 2014-06-11 | Discharge: 2014-06-11 | Disposition: A | Discharge status: Home / Self Care | Payer: Medicaid Other | Source: Ambulatory Visit | Attending: Obstetrics and Gynecology | Admitting: Obstetrics and Gynecology

## 2014-06-13 NOTE — Discharge Summary (Signed)
Physician Discharge Summary  Patient ID: Kelly Buck MRN: [id number] DOB/AGE: August 23, 1981 33 y.o.  Admit date: 06/01/2014 Discharge date: 06/13/2014  Admission Diagnoses:Possible retained placenta, post partum endometritis  Discharge Diagnoses: Probable early pp endometritis, severe constipation Active Problems:   Retained products of conception after delivery without hemorrhage   Discharged Condition: good  Hospital Course: Pt seen in MAU for returned pain 2 days after D&E for path proven retained products.  [place] indicated that there was no change from the preop [place] before the D&E and the concern was that there was still retained products per Radiology.  Another D&E was performed and no products were seen in specimen.  Specimen was sent for culture as well.  Although pt's WBC and temp were normal on admission, she c/o a fever the night before and pt still had significant pain so she was treated for presumptive early pp endometritis.  Pt's post op course has been complicated only from pt's perception of pain and not feeling well- her vitals, labs and exam have all remained normal pp.  She has not had excessive bleeding.  A CT was done yesterday secondary pt's continued c/o pain and showed still some signs of blood in EM but no other pathology in the abdomen except for a 9 cm dilated colon with stool.  Specimen cultures are still all no growth and no organisms were seen on stain.  Pt was encouraged to stop narcotics, given Miralax and encouraged to walk more.  Pt was not sent home with antibiotics secondary cultures were negative and I have a concern that she can develop C Diff.  HCG was also negative r/o any persistence of any POC.  Consults: None  Significant Diagnostic Studies: labs:   No results found for this or any previous visit (from the past 72 hour(s)).  Treatments: antibiotics: gentamycin and Amp and clinda; D&E under US guidance  Discharge Exam: Blood pressure 130/76, pulse 50,  temperature 98.1 F (36.7 C), temperature source Oral, resp. rate 12, height 5' 5.5" (1.664 m), weight 98.657 kg (217 lb 8 oz), last menstrual period 08/26/2013, SpO2 97.00%, currently breastfeeding.   Disposition: 01-Home or Self Care      Discharge Instructions   Call MD for:  temperature >100.4    Complete by:  As directed      Call MD for:  temperature >100.4    Complete by:  As directed      Diet - low sodium heart healthy    Complete by:  As directed      Diet - low sodium heart healthy    Complete by:  As directed      Discharge instructions    Complete by:  As directed   No driving on narcotics, no sexual activity for 2 weeks.     Discharge instructions    Complete by:  As directed   No driving on narcotics, no sexual activity for 2 weeks.     Increase activity slowly    Complete by:  As directed      Increase activity slowly    Complete by:  As directed      May shower / Bathe    Complete by:  As directed   Shower, no bath for 2 weeks.     May shower / Bathe    Complete by:  As directed   Shower, no bath for 2 weeks.     Remove dressing in 24 hours    Complete by:  As  directed      Remove dressing in 24 hours    Complete by:  As directed      Sexual Activity Restrictions    Complete by:  As directed   No sexual activity for 2 weeks.     Sexual Activity Restrictions    Complete by:  As directed   No sexual activity for 2 weeks.            Medication List         acetaminophen 500 MG tablet  Commonly known as:  TYLENOL  Take 1,000 mg by mouth every 6 (six) hours as needed for moderate pain.     docusate sodium 100 MG capsule  Commonly known as:  COLACE  Take 1 capsule (100 mg total) by mouth 2 (two) times daily.     prenatal vitamin w/FE, FA 29-1 MG Chew chewable tablet  Chew 2 tablets by mouth daily at 12 noon.         Signed: Harley Fitzwater A 06/13/2014, 10:44 AM

## 2014-07-29 ENCOUNTER — Emergency Department (HOSPITAL_COMMUNITY): Payer: Medicaid Other

## 2014-07-29 ENCOUNTER — Emergency Department (HOSPITAL_COMMUNITY)
Admission: EM | Admit: 2014-07-29 | Discharge: 2014-07-29 | Disposition: A | Discharge status: Home / Self Care | Payer: Medicaid Other | Attending: Emergency Medicine | Admitting: Emergency Medicine

## 2014-07-29 ENCOUNTER — Encounter (HOSPITAL_COMMUNITY): Payer: Self-pay | Admitting: Emergency Medicine

## 2014-07-29 DIAGNOSIS — R109 Unspecified abdominal pain: Secondary | ICD-10-CM | POA: Diagnosis not present

## 2014-07-29 DIAGNOSIS — R202 Paresthesia of skin: Secondary | ICD-10-CM | POA: Insufficient documentation

## 2014-07-29 DIAGNOSIS — R42 Dizziness and giddiness: Secondary | ICD-10-CM | POA: Insufficient documentation

## 2014-07-29 DIAGNOSIS — R531 Weakness: Secondary | ICD-10-CM | POA: Diagnosis not present

## 2014-07-29 LAB — CBC WITH DIFFERENTIAL/PLATELET
BASOS ABS: 0 10*3/uL (ref 0.0–0.1)
BASOS PCT: 0 % (ref 0–1)
EOS PCT: 5 % (ref 0–5)
Eosinophils Absolute: 0.5 10*3/uL (ref 0.0–0.7)
HEMATOCRIT: 37.7 % (ref 36.0–46.0)
HEMOGLOBIN: 12.8 g/dL (ref 12.0–15.0)
Lymphocytes Relative: 42 % (ref 12–46)
Lymphs Abs: 4.1 10*3/uL — ABNORMAL HIGH (ref 0.7–4.0)
MCH: 28.8 pg (ref 26.0–34.0)
MCHC: 34 g/dL (ref 30.0–36.0)
MCV: 84.9 fL (ref 78.0–100.0)
MONO ABS: 0.7 10*3/uL (ref 0.1–1.0)
MONOS PCT: 7 % (ref 3–12)
NEUTROS ABS: 4.4 10*3/uL (ref 1.7–7.7)
Neutrophils Relative %: 46 % (ref 43–77)
Platelets: 390 10*3/uL (ref 150–400)
RBC: 4.44 MIL/uL (ref 3.87–5.11)
RDW: 13.3 % (ref 11.5–15.5)
WBC: 9.7 10*3/uL (ref 4.0–10.5)

## 2014-07-29 LAB — COMPREHENSIVE METABOLIC PANEL
ALBUMIN: 3.9 g/dL (ref 3.5–5.2)
ALK PHOS: 51 U/L (ref 39–117)
ALT: 61 U/L — AB (ref 0–35)
AST: 31 U/L (ref 0–37)
Anion gap: 12 (ref 5–15)
BILIRUBIN TOTAL: 0.2 mg/dL — AB (ref 0.3–1.2)
BUN: 8 mg/dL (ref 6–23)
CHLORIDE: 101 meq/L (ref 96–112)
CO2: 25 mEq/L (ref 19–32)
Calcium: 9.4 mg/dL (ref 8.4–10.5)
Creatinine, Ser: 0.83 mg/dL (ref 0.50–1.10)
GFR calc Af Amer: >90 mL/min (ref >90)
GFR calc non Af Amer: >90 mL/min (ref >90)
Glucose, Bld: 93 mg/dL (ref 70–99)
POTASSIUM: 4.1 meq/L (ref 3.7–5.3)
Sodium: 138 mEq/L (ref 137–147)
Total Protein: 7.2 g/dL (ref 6.0–8.3)

## 2014-07-29 LAB — CBG MONITORING, ED: GLUCOSE-CAPILLARY: 93 mg/dL (ref 70–99)

## 2014-07-29 LAB — URINALYSIS, ROUTINE W REFLEX MICROSCOPIC
BILIRUBIN URINE: NEGATIVE
Glucose, UA: NEGATIVE mg/dL
HGB URINE DIPSTICK: NEGATIVE
KETONES UR: NEGATIVE mg/dL
Leukocytes, UA: NEGATIVE
Nitrite: NEGATIVE
Protein, ur: NEGATIVE mg/dL
SPECIFIC GRAVITY, URINE: 1.012 (ref 1.005–1.030)
Urobilinogen, UA: 0.2 mg/dL (ref 0.0–1.0)
pH: 6.5 (ref 5.0–8.0)

## 2014-07-29 LAB — TSH: TSH: 2.02 u[IU]/mL (ref 0.350–4.500)

## 2014-07-29 LAB — SEDIMENTATION RATE: Sed Rate: 9 mm/hr (ref 0–22)

## 2014-07-29 NOTE — ED Notes (Signed)
Checked patient blood sugar it was 93 notified RN of blood sugar

## 2014-07-29 NOTE — ED Notes (Signed)
Pt reports severe pain and numbness in bilateral arms, dizziness, headache x 1 week. sts it started 4 weeks ago in her legs. Pt had severe infx after having child 8 weeks ago. She also c/o intermittent stabbing uterine pains. Denies fevers/chills but sts she wakes up in sweats.

## 2014-07-29 NOTE — ED Provider Notes (Signed)
CSN: [id number]     Arrival date & time 07/29/14  1649 History   First MD Initiated Contact with Patient 07/29/14 1711     Chief Complaint  Patient presents with  . Numbness  . Dizziness     (Consider location/radiation/quality/duration/timing/severity/associated sxs/prior Treatment) Patient is a 33 y.o. female presenting with neurologic complaint.  Neurologic Problem This is a new problem. The current episode started 1 to 4 weeks ago. The problem occurs constantly. The problem has been gradually worsening. Associated symptoms include abdominal pain, numbness, vertigo and weakness. Pertinent negatives include no arthralgias, chest pain, chills, congestion, coughing, fever, headaches, myalgias, nausea, rash, sore throat, visual change or vomiting. Nothing aggravates the symptoms. She has tried nothing for the symptoms.    Past Medical History  Diagnosis Date  . Medical history non-contributory    Past Surgical History  Procedure Laterality Date  . Placement of breast implants    . Carpal tunnel release    . Tummy tuck    . Dilation and evacuation N/A 06/01/2014    Procedure: DILATATION AND EVACUATION;  Surgeon: [name], MD;  Location: [facility];  Service: Gynecology;  Laterality: N/A;  . Dilation and evacuation N/A 06/04/2014    Procedure: DILATATION AND EVACUATION;  Surgeon: [name], MD;  Location: [facility];  Service: Gynecology;  Laterality: N/A;   No family history on file. History  Substance Use Topics  . Smoking status: Never Smoker   . Smokeless tobacco: Never Used  . Alcohol Use: No   OB History   Grav Para Term Preterm Abortions TAB SAB Ect Mult Living   5 4 4  1  1   4      Review of Systems  Constitutional: Negative for fever and chills.  HENT: Negative for congestion and sore throat.   Eyes: Negative for visual disturbance.  Respiratory: Negative for cough, shortness of breath and wheezing.   Cardiovascular: Negative for chest pain.   Gastrointestinal: Positive for abdominal pain. Negative for nausea, vomiting, diarrhea and constipation.  Genitourinary: Negative for dysuria, vaginal bleeding, difficulty urinating and vaginal pain.  Musculoskeletal: Negative for arthralgias and myalgias.  Skin: Negative for rash.  Neurological: Positive for dizziness, vertigo, weakness, light-headedness and numbness. Negative for syncope and headaches.  Psychiatric/Behavioral: Negative for behavioral problems.  All other systems reviewed and are negative.     Allergies  Review of patient's allergies indicates no known allergies.  Home Medications   Prior to Admission medications   Not on File   BP 129/90  Pulse 75  Temp(Src) 98.2 F (36.8 C) (Oral)  Resp 18  SpO2 93% Physical Exam  Vitals reviewed. Constitutional: She is oriented to person, place, and time. She appears well-developed and well-nourished. No distress.  HENT:  Head: Normocephalic and atraumatic.  Eyes: EOM are normal.  Neck: Normal range of motion.  Cardiovascular: Normal rate, regular rhythm and normal heart sounds.   No murmur heard. Pulmonary/Chest: Effort normal and breath sounds normal. No respiratory distress. She has no wheezes.  Abdominal: Soft. Bowel sounds are normal. She exhibits no distension. There is no tenderness. There is no rebound and no guarding.  Musculoskeletal: She exhibits no edema.  Neurological: She is alert and oriented to person, place, and time. She has normal strength and normal reflexes. No cranial nerve deficit or sensory deficit. She displays a negative Romberg sign. Coordination and gait normal. GCS eye subscore is 4. GCS verbal subscore is 5. GCS motor subscore is 6. She displays no  Babinski's sign on the right side. She displays no Babinski's sign on the left side.  Skin: She is not diaphoretic.  Psychiatric: She has a normal mood and affect. Her behavior is normal.    ED Course  Procedures (including critical care  time) Labs Review Labs Reviewed  CBC WITH DIFFERENTIAL - Abnormal; Notable for the following:    Lymphs Abs 4.1 (*)    All other components within normal limits  COMPREHENSIVE METABOLIC PANEL - Abnormal; Notable for the following:    ALT 61 (*)    Total Bilirubin 0.2 (*)    All other components within normal limits  URINALYSIS, ROUTINE W REFLEX MICROSCOPIC  SEDIMENTATION RATE  TSH  VITAMIN B12  CBG MONITORING, ED    Imaging Review Ct Head Wo Contrast  07/29/2014   CLINICAL DATA:  Numbness and dizziness. Bilateral arm pain for 2 weeks, getting worse.  EXAM: CT HEAD WITHOUT CONTRAST  TECHNIQUE: Contiguous axial images were obtained from the base of the skull through the vertex without intravenous contrast.  COMPARISON:  Head CT 01/11/2006  FINDINGS: No acute intracranial abnormality is identified. Specifically, negative for intra or extra-axial hemorrhage, mass effect, mass lesion, or evidence of acute infarction. Skull is intact. The visualized paranasal sinuses are clear. The soft tissues the orbits and scalp are symmetric.  IMPRESSION: Normal head CT.   Electronically Signed   By: Curlene Dolphin M.D.   On: 07/29/2014 18:40     EKG Interpretation None      MDM   Final diagnoses:  Tingling in extremities    Patient is a 33 year old female 8 weeks status post vaginal delivery that presents with 2 weeks of tingling and lightheadedness. Patient states that he has tingling that started in her feet went up to her knees and started in her hands and went up to her elbows bilaterally. Patient also states she has had some headiness feelings in these extremities however still retains function in all 4 extremities. Patient says when she stands up she gets mildly lightheaded however has not syncopized. Patient denies any headache or nausea vomiting. On exam the patient's has a normal neurologic exam and states she is not having any decreased sensation at this time. Patient was able to ambulate and  has normal coordination showing no cerebellar deficits. Patient's abdomen is soft and nontender. Given the patient's findings a workup was done which showed a normal CT head, normal lab work. A B12 level was sent however as this is a send out the patient will be called with results. Given the normal workup and normal neurologic exam it is felt the patient could be followed with neurology in an outpatient setting. Patient given return precautions and she voiced understanding.     Renne Musca, MD 07/30/14 [phone]

## 2014-07-29 NOTE — Discharge Instructions (Signed)

## 2014-07-30 LAB — VITAMIN B12: Vitamin B-12: 574 pg/mL (ref 211–911)

## 2014-08-03 NOTE — ED Provider Notes (Signed)
This patient was seen in conjunction with the resident physician, Dr. Robina Ade.  The documentation accurately reflects the patient's encounter in the ED.  On my exam, the patient was awake and alert, with no objective neurologic deficits.  Patient's description of distal peripheral dysesthesia was evaluated with labs, CT, all of which was reassuring. Patient, according to EMR, is status post delivery, complicated by retained products of conception, requiring evacuation once, and given continued concerns following that procedure required a second attempt to retrieve additional possible retained products of conception, which were not present. No current evidence for endometritis, peritonitis, eclampsia, thromboembolic processes. Given the reassuring findings, the absence of objective deficits, stable vital signs, patient was appropriate for further evaluation, management as an outpatient.  Carmin Muskrat, MD 08/03/14 [phone]

## 2014-08-23 ENCOUNTER — Encounter (HOSPITAL_COMMUNITY): Payer: Self-pay | Admitting: Emergency Medicine

## 2015-01-04 ENCOUNTER — Other Ambulatory Visit: Payer: Self-pay | Admitting: Medical

## 2015-01-04 ENCOUNTER — Inpatient Hospital Stay (HOSPITAL_COMMUNITY)
Admission: AD | Admit: 2015-01-04 | Discharge: 2015-01-04 | Disposition: A | Discharge status: Home / Self Care | Payer: Medicaid Other | Source: Ambulatory Visit | Attending: Obstetrics & Gynecology | Admitting: Obstetrics & Gynecology

## 2015-01-04 DIAGNOSIS — N6310 Unspecified lump in the right breast, unspecified quadrant: Secondary | ICD-10-CM

## 2015-01-04 DIAGNOSIS — N63 Unspecified lump in breast: Secondary | ICD-10-CM

## 2015-01-04 DIAGNOSIS — R5383 Other fatigue: Secondary | ICD-10-CM | POA: Insufficient documentation

## 2015-01-04 NOTE — MAU Provider Note (Signed)
  History     CSN: [id number]  Arrival date and time: 01/04/15 1644   First Provider Initiated Contact with Patient 01/04/15 1722      Chief Complaint  Patient presents with  . Breast Mass   HPI  Ms. MITZY NARON is a 34 y.o. [id number] who presents to MAU today with complaint of a right breast and axillary mass first noted last night. She states mild tenderness to palpation. She denies redness, warmth, drainage, breast discharge or bleeding. She also denies fever.   OB History    Gravida Para Term Preterm AB TAB SAB Ectopic Multiple Living   5 4 4  1  1   4       Past Medical History  Diagnosis Date  . Medical history non-contributory     Past Surgical History  Procedure Laterality Date  . Placement of breast implants    . Carpal tunnel release    . Tummy tuck    . Dilation and evacuation N/A 06/01/2014    Procedure: DILATATION AND EVACUATION;  Surgeon: [name], MD;  Location: [facility];  Service: Gynecology;  Laterality: N/A;  . Dilation and evacuation N/A 06/04/2014    Procedure: DILATATION AND EVACUATION;  Surgeon: [name], MD;  Location: [facility];  Service: Gynecology;  Laterality: N/A;    No family history on file.  History  Substance Use Topics  . Smoking status: Never Smoker   . Smokeless tobacco: Never Used  . Alcohol Use: No    Allergies: No Known Allergies  No prescriptions prior to admission    Review of Systems  Constitutional: Negative for fever and malaise/fatigue.  Gastrointestinal: Negative for abdominal pain.  Genitourinary:       + breast mass, tenderness Neg - drainage, heat, discharge, bleeding   Physical Exam   Blood pressure 146/88, pulse 91, temperature 98.4 F (36.9 C), temperature source Oral, resp. rate 18, height 5' 5.5" (1.664 m), weight 227 lb 12.8 oz (103.329 kg), last menstrual period 11/29/2014, currently breastfeeding.  Physical Exam  Constitutional: She is oriented to person, place, and time. She appears  well-developed and well-nourished. No distress.  HENT:  Head: Normocephalic.  Cardiovascular: Normal rate.   Respiratory: Effort normal. Right breast exhibits mass (large, fluctuant, moveable mass noted at 9 o'clock) and tenderness (mild). Right breast exhibits no inverted nipple, no nipple discharge and no skin change. Left breast exhibits no inverted nipple, no mass, no nipple discharge, no skin change and no tenderness. Breasts are symmetrical.  Neurological: She is alert and oriented to person, place, and time.  Skin: Skin is warm and dry. No rash noted. No erythema.  Psychiatric: She has a normal mood and affect.   MAU Course  Procedures  MDM Unable to do emergent breast US. Will refer to [facility] for imaging tomorrow. Patient agrees with plan.   Assessment and Plan  A: Breast mass, right  P: Discharge home Patient referred to [facility] for imaging tomorrow. Contact information given Warning signs for abscess discussed. Patient advised to present to [facility] for drainage if symptoms occur Patient may return to MAU as needed or if her condition were to change or worsen   Luvenia Redden, PA-C  01/04/2015, 6:40 PM

## 2015-01-04 NOTE — Discharge Instructions (Signed)
Breast Cyst A breast cyst is a sac in the breast that is filled with fluid. Breast cysts are common in women. Women can have one or many cysts. When the breasts contain many cysts, it is usually due to a noncancerous (benign) condition called fibrocystic change. These lumps form under the influence of female hormones (estrogen and progesterone). The lumps are most often located in the upper, outer portion of the breast. They are often more swollen, painful, and tender before your period starts. They usually disappear after menopause, unless you are on hormone therapy.  There are several types of cysts:  Macrocyst. This is a cyst that is about 2 in. (5.1 cm) in diameter.   Microcyst. This is a tiny cyst that you cannot feel but can be seen with a mammogram or an ultrasound.   Galactocele. This is a cyst containing milk that may develop if you suddenly stop breastfeeding.   Sebaceous cyst of the skin. This type of cyst is not in the breast tissue itself. Breast cysts do not increase your risk of breast cancer. However, they must be monitored closely because they can be cancerous.  CAUSES  It is not known exactly what causes a breast cyst to form. Possible causes include:  An overgrowth of milk glands and connective tissue in the breast can block the milk glands, causing them to fill with fluid.   Scar tissue in the breast from previous surgery may block the glands, causing a cyst.  RISK FACTORS Estrogen may influence the development of a breast cyst.  SIGNS AND SYMPTOMS   Feeling a smooth, round, soft lump (like a grape) in the breast that is easily moveable.   Breast discomfort or pain.  Increase in size of the lump before your menstrual period and decrease in its size after your menstrual period.  DIAGNOSIS  A cyst can be felt during a physical exam by your health care provider. A breast X-ray exam (mammogram) and ultrasonography will be done to confirm the diagnosis. Fluid may  be removed from the cyst with a needle (fine needle aspiration) to make sure the cyst is not cancerous.  TREATMENT  Treatment may not be necessary. Your health care provider may monitor the cyst to see if it goes away on its own. If treatment is needed, it may include:  Hormone treatment.   Needle aspiration. There is a chance of the cyst coming back after aspiration.   Surgery to remove the whole cyst.  HOME CARE INSTRUCTIONS   Keep all follow-up appointments with your health care provider.  See your health care provider regularly:  Get a yearly exam by your health care provider.  Have a clinical breast exam by a health care provider every 1-3 years if you are 20-40 years of age. After age 40 years, you should have the exam every year.   Get mammogram tests as directed by your health care provider.   Understand the normal appearance and feel of your breasts and perform breast self-exams.   Only take over-the-counter or prescription medicines as directed by your health care provider.   Wear a supportive bra, especially when exercising.   Avoid caffeine.   Reduce your salt intake, especially before your menstrual period. Too much salt can cause fluid retention, breast swelling, and discomfort.  SEEK MEDICAL CARE IF:   You feel, or think you feel, a lump in your breast.   You notice that both breasts look or feel different than usual.   Your   breast is still causing pain after your menstrual period is over.   You need medicine for breast pain and swelling that occurs with your menstrual period.  SEEK IMMEDIATE MEDICAL CARE IF:   You have severe pain, tenderness, redness, or warmth in your breast.   You have nipple discharge or bleeding.   Your breast lump becomes hard and painful.   You find new lumps or bumps that were not there before.   You feel lumps in your armpit (axilla).   You notice dimpling or wrinkling of the breast or nipple.   You  have a fever.  MAKE SURE YOU:  Understand these instructions.  Will watch your condition.  Will get help right away if you are not doing well or get worse. Document Released: 10/08/2005 Document Revised: 06/10/2013 Document Reviewed: 05/07/2013 Exit[employer] Patient Information 2015 [employer]. This information is not intended to replace advice given to you by your health care provider. Make sure you discuss any questions you have with your health care provider.  

## 2015-01-04 NOTE — MAU Note (Signed)
Pt found mass in R breast & under armpit last night.  C/O extreme fatigue for the last month.  R armpit has had strong odor for the past 2 months.  Pt states breast lump is sore.

## 2015-01-05 ENCOUNTER — Telehealth: Payer: Self-pay | Admitting: *Deleted

## 2015-01-05 ENCOUNTER — Ambulatory Visit
Admission: RE | Admit: 2015-01-05 | Discharge: 2015-01-05 | Disposition: A | Discharge status: Home / Self Care | Payer: Medicaid Other | Source: Ambulatory Visit | Attending: Medical | Admitting: Medical

## 2015-01-05 ENCOUNTER — Other Ambulatory Visit: Payer: Self-pay | Admitting: Medical

## 2015-01-05 DIAGNOSIS — N6310 Unspecified lump in the right breast, unspecified quadrant: Secondary | ICD-10-CM

## 2015-01-05 NOTE — Telephone Encounter (Signed)
Felia from [facility] left a message they need a signature for a mammogram ordered last night.  [facility] to clarify. Patient was seen in the MAU, breast ultrasound ordered , but mammogram also needed per [facility] protocol- have sent to Almyra Free to approve order and sign. Informed them I will let Almyra Free know.  Called MAU - left message for Almyra Free re: order and need to sign.

## 2015-05-03 IMAGING — US US TRANSVAGINAL NON-OB
1 series · 13 of 25 positions shown · non-contrast
Comparison: Preprocedural ultrasound 06/01/2014.

CLINICAL DATA: 33-year-old female with bleeding status post
delivery on 05/21/2014, more recently status post D & C on
06/01/2014. Fever. Query continued retained products of conception.
Initial encounter.

EXAM:
TRANSVAGINAL ULTRASOUND OF PELVIS
TECHNIQUE: Transvaginal ultrasound examination of the pelvis was performed
including evaluation of the uterus, ovaries, adnexal regions, and
pelvic cul-de-sac.

[Series 1: us pelvis complete · 31 acquisitions, 13 frames shown]
[im 1/31]
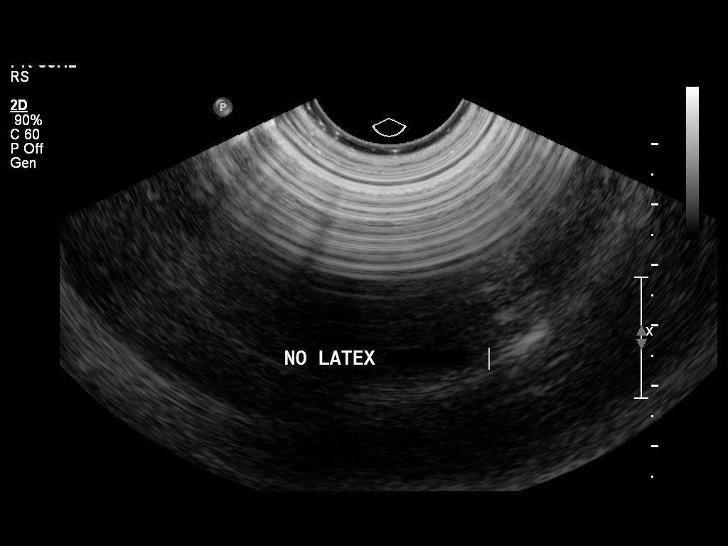
[im 3/31]
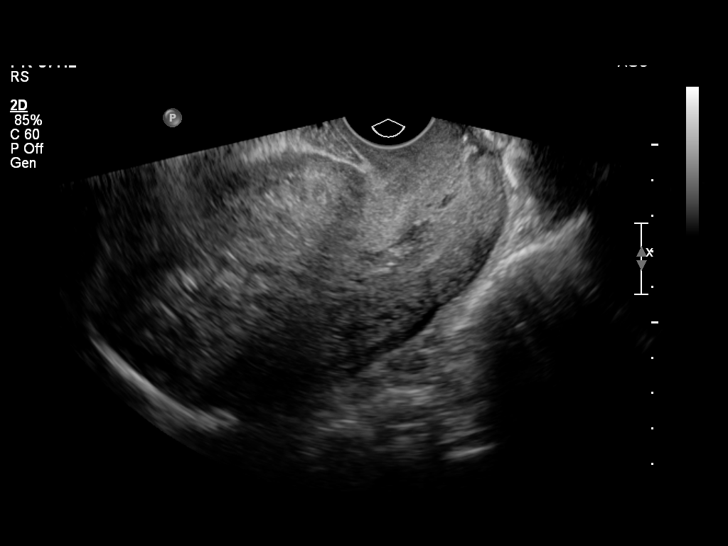
[im 6/31]
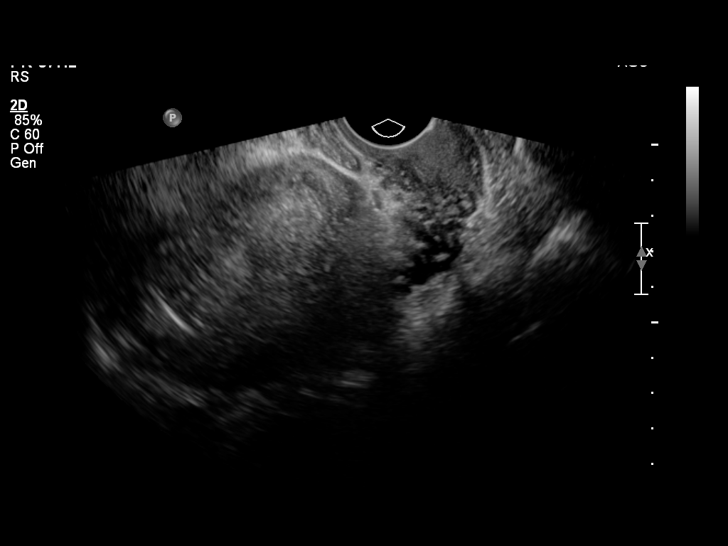
[im 8/31]
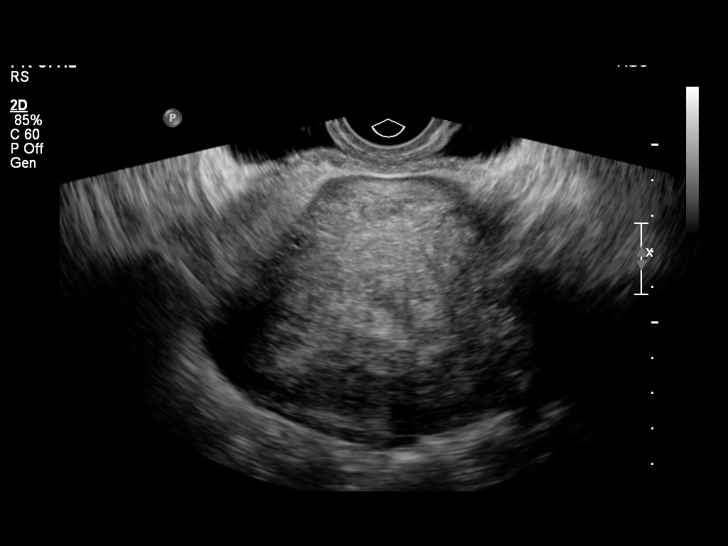
[im 11/31]
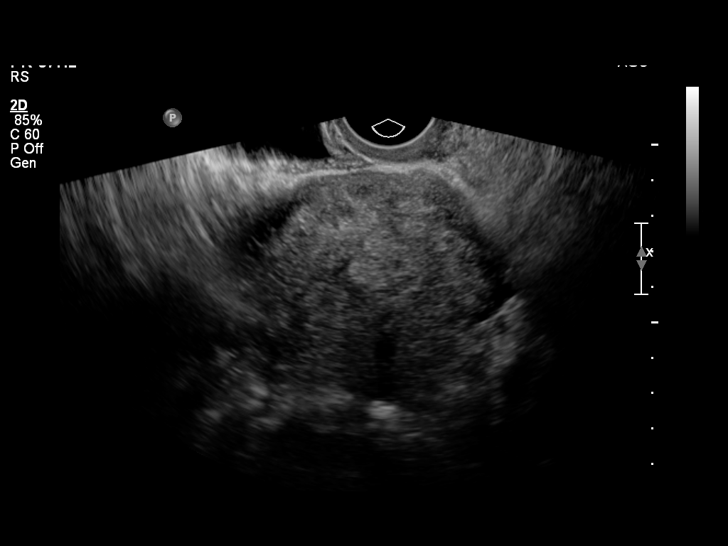
[im 13/31]
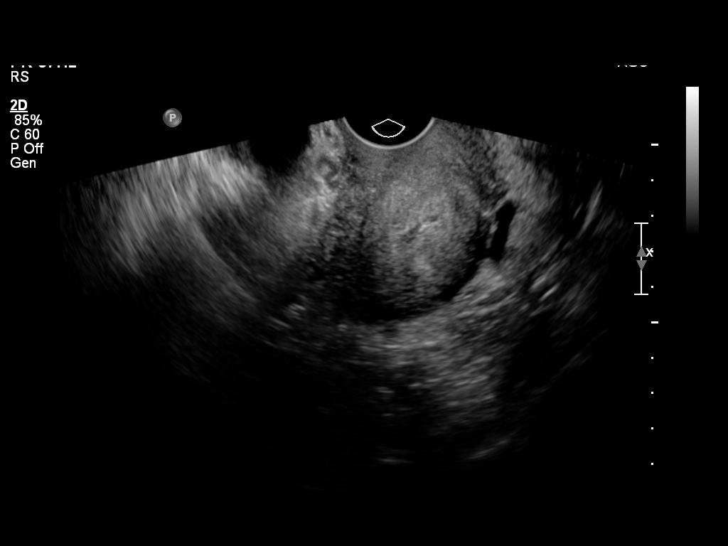
[im 16/31]
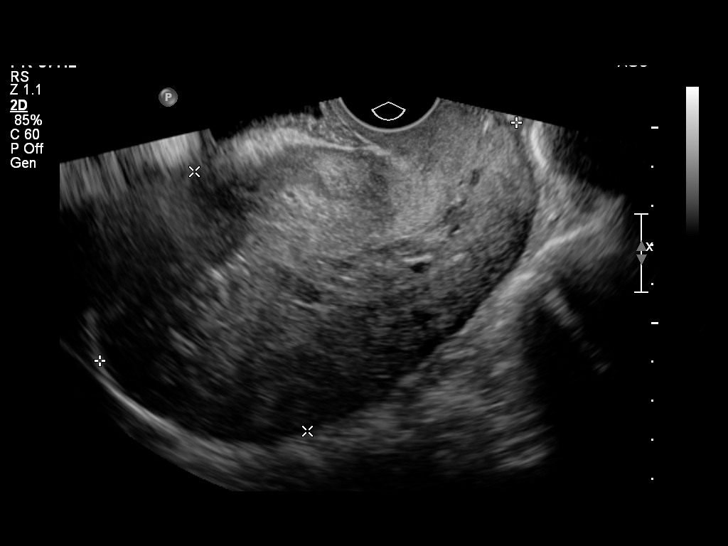
[im 18/31]
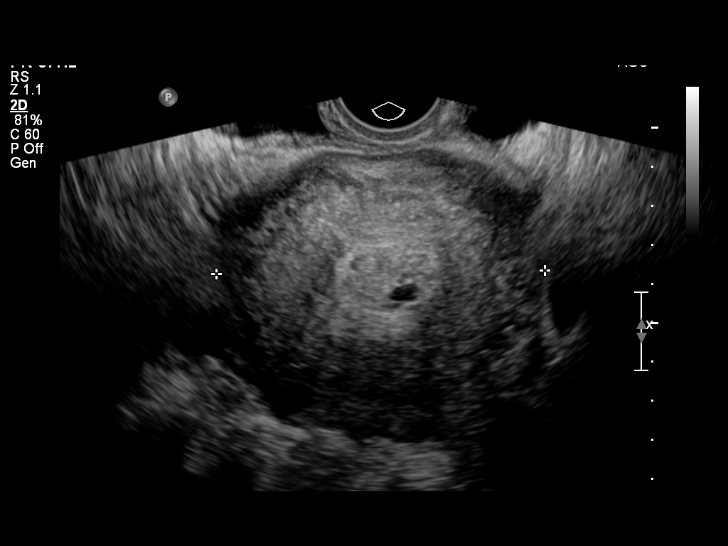
[im 21/31]
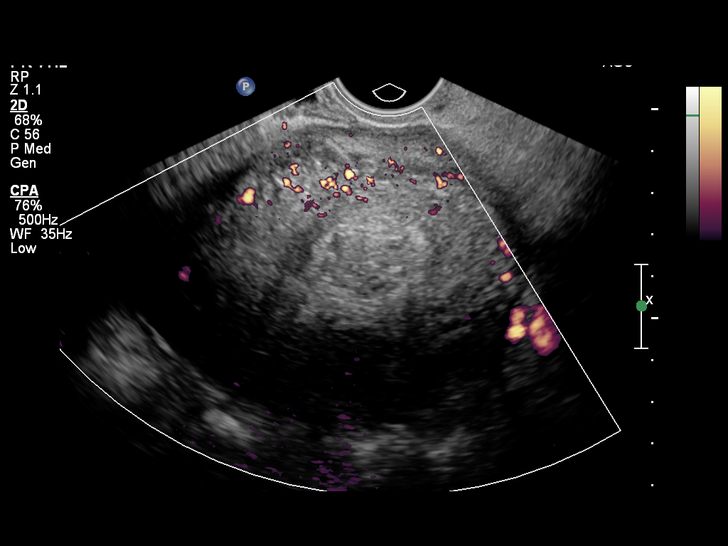
[im 23/31]
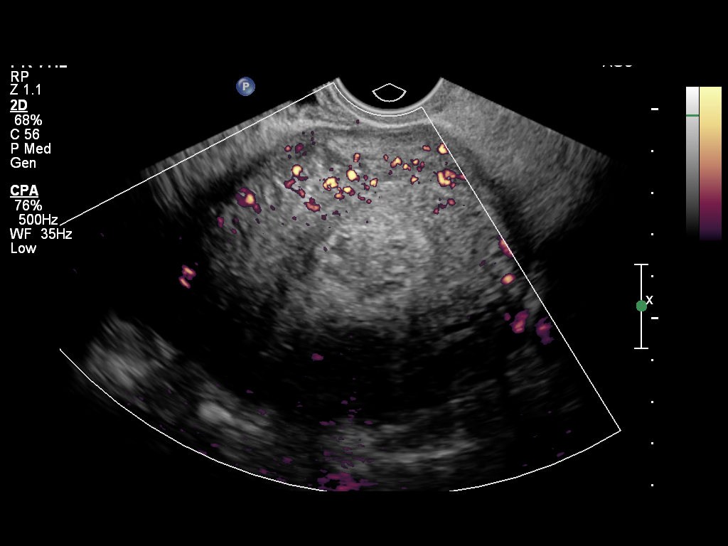
[im 26/31]
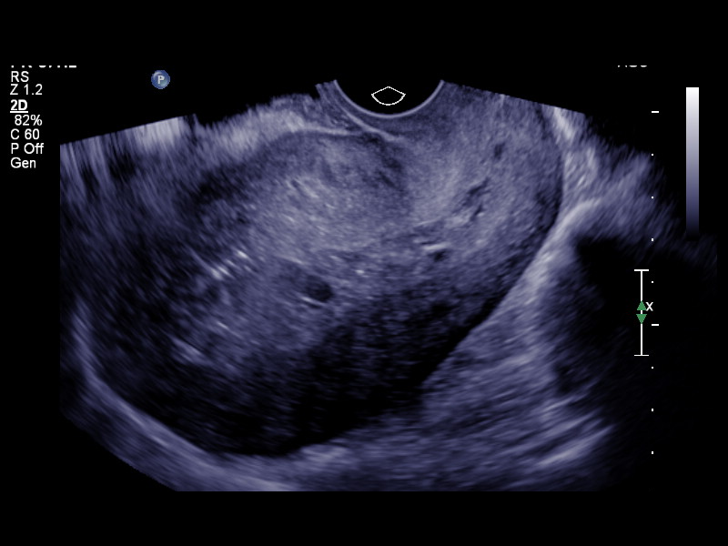
[im 28/31]
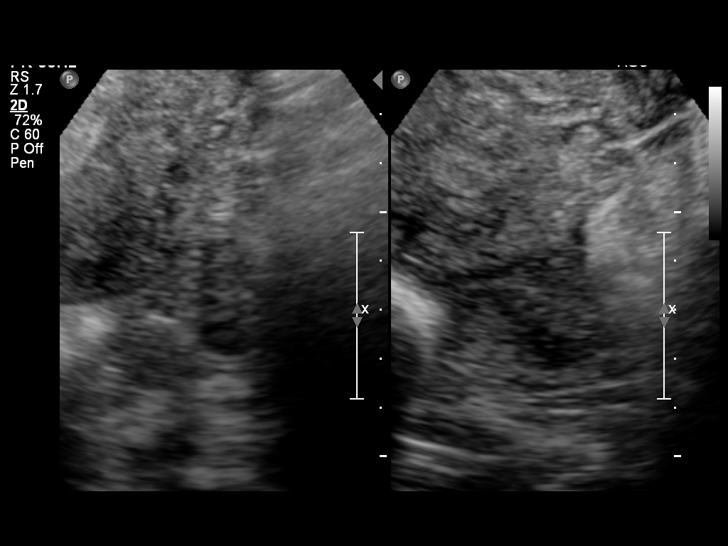
[im 31/31]
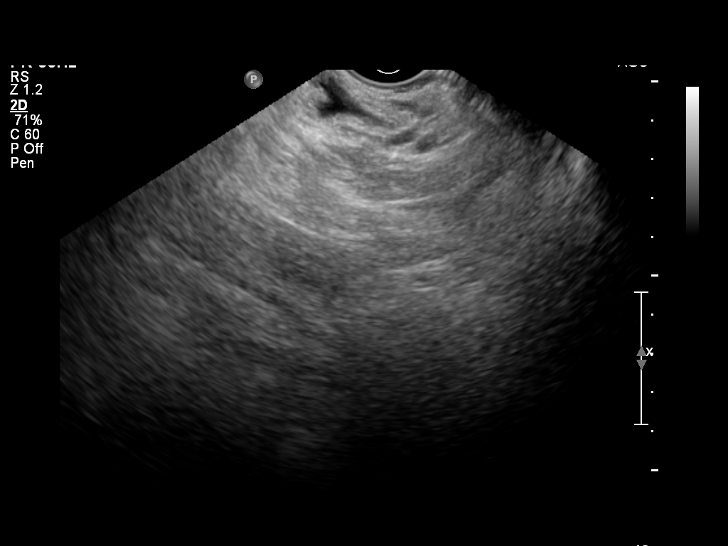

[13 of 25 positions shown; findings below may reference images not displayed]

FINDINGS: Uterus

Measurements: 12.3 x 7.3 x 0.4 cm. No fibroids or other mass
visualized.

Endometrium

Thickness: 3.2 cm. Remains heterogeneous and enlarged (see image
22). This endometrial heterogeneity does not appear significantly
improved since the preprocedural study on 06/01/2014.

Right ovary

Measurements: Not visualized due to overlying bowel gas.

Left ovary

Measurements: 2.7 x 1.9 x 1.5 cm. Normal appearance/no adnexal mass.

Other findings:  No free fluid
IMPRESSION: 1. Endometrial thickening and heterogeneity does not appear improved
since 06/01/2014. Query ENDOMETRITIS in this setting, otherwise
ongoing retained products of conception not excluded.
2. No free fluid. Right ovary not visible today, left over remains
normal.

## 2015-11-23 ENCOUNTER — Other Ambulatory Visit: Payer: Self-pay | Admitting: Family Medicine

## 2015-11-23 DIAGNOSIS — R109 Unspecified abdominal pain: Secondary | ICD-10-CM

## 2015-11-24 ENCOUNTER — Ambulatory Visit
Admission: RE | Admit: 2015-11-24 | Discharge: 2015-11-24 | Disposition: A | Discharge status: Home / Self Care | Payer: Medicaid Other | Source: Ambulatory Visit | Attending: Family Medicine | Admitting: Family Medicine

## 2015-11-24 DIAGNOSIS — R109 Unspecified abdominal pain: Secondary | ICD-10-CM

## 2015-11-24 MED ORDER — IOPAMIDOL (ISOVUE-300) INJECTION 61%
100.0000 mL | Freq: Once | INTRAVENOUS | Status: AC | PRN
  Administered 2015-11-24: 100 mL via INTRAVENOUS

## 2015-11-25 ENCOUNTER — Encounter (HOSPITAL_COMMUNITY): Payer: Self-pay | Admitting: *Deleted

## 2015-11-25 ENCOUNTER — Inpatient Hospital Stay (HOSPITAL_COMMUNITY): Payer: Medicaid Other

## 2015-11-25 ENCOUNTER — Inpatient Hospital Stay (HOSPITAL_COMMUNITY)
Admission: AD | Admit: 2015-11-25 | Discharge: 2015-11-25 | Disposition: A | Discharge status: Home / Self Care | Payer: Medicaid Other | Source: Ambulatory Visit | Attending: Family Medicine | Admitting: Family Medicine

## 2015-11-25 DIAGNOSIS — N739 Female pelvic inflammatory disease, unspecified: Secondary | ICD-10-CM

## 2015-11-25 DIAGNOSIS — N949 Unspecified condition associated with female genital organs and menstrual cycle: Secondary | ICD-10-CM

## 2015-11-25 DIAGNOSIS — M545 Low back pain, unspecified: Secondary | ICD-10-CM

## 2015-11-25 DIAGNOSIS — Z3202 Encounter for pregnancy test, result negative: Secondary | ICD-10-CM | POA: Diagnosis not present

## 2015-11-25 DIAGNOSIS — R102 Pelvic and perineal pain: Secondary | ICD-10-CM | POA: Diagnosis present

## 2015-11-25 HISTORY — DX: Unspecified abnormal cytological findings in specimens from vagina: R87.629

## 2015-11-25 HISTORY — DX: Anxiety disorder, unspecified: F41.9

## 2015-11-25 LAB — COMPREHENSIVE METABOLIC PANEL
ALT: 39 U/L (ref 14–54)
AST: 26 U/L (ref 15–41)
Albumin: 3.9 g/dL (ref 3.5–5.0)
Alkaline Phosphatase: 40 U/L (ref 38–126)
Anion gap: 10 (ref 5–15)
BUN: 9 mg/dL (ref 6–20)
CO2: 26 mmol/L (ref 22–32)
Calcium: 8.8 mg/dL — ABNORMAL LOW (ref 8.9–10.3)
Chloride: 99 mmol/L — ABNORMAL LOW (ref 101–111)
Creatinine, Ser: 0.79 mg/dL (ref 0.44–1.00)
Glucose, Bld: 121 mg/dL — ABNORMAL HIGH (ref 65–99)
POTASSIUM: 3.9 mmol/L (ref 3.5–5.1)
Sodium: 135 mmol/L (ref 135–145)
Total Bilirubin: 0.5 mg/dL (ref 0.3–1.2)
Total Protein: 6.8 g/dL (ref 6.5–8.1)

## 2015-11-25 LAB — CBC
HCT: 39.7 % (ref 36.0–46.0)
Hemoglobin: 13.4 g/dL (ref 12.0–15.0)
MCH: 30.6 pg (ref 26.0–34.0)
MCHC: 33.8 g/dL (ref 30.0–36.0)
MCV: 90.6 fL (ref 78.0–100.0)
PLATELETS: 357 10*3/uL (ref 150–400)
RBC: 4.38 MIL/uL (ref 3.87–5.11)
RDW: 12.6 % (ref 11.5–15.5)
WBC: 10.2 10*3/uL (ref 4.0–10.5)

## 2015-11-25 LAB — POCT PREGNANCY, URINE: Preg Test, Ur: NEGATIVE

## 2015-11-25 LAB — URINE MICROSCOPIC-ADD ON: Bacteria, UA: NONE SEEN

## 2015-11-25 LAB — WET PREP, GENITAL
Sperm: NONE SEEN
TRICH WET PREP: NONE SEEN
YEAST WET PREP: NONE SEEN

## 2015-11-25 LAB — URINALYSIS, ROUTINE W REFLEX MICROSCOPIC
Bilirubin Urine: NEGATIVE
GLUCOSE, UA: NEGATIVE mg/dL
Hgb urine dipstick: NEGATIVE
Ketones, ur: NEGATIVE mg/dL
NITRITE: NEGATIVE
PROTEIN: NEGATIVE mg/dL
Specific Gravity, Urine: 1.025 (ref 1.005–1.030)
pH: 5.5 (ref 5.0–8.0)

## 2015-11-25 MED ORDER — CEFTRIAXONE SODIUM 250 MG IJ SOLR
250.0000 mg | Freq: Once | INTRAMUSCULAR | Status: AC
  Administered 2015-11-25: 250 mg via INTRAMUSCULAR
  Filled 2015-11-25: qty 250

## 2015-11-25 MED ORDER — ONDANSETRON 8 MG PO TBDP: 8.0000 mg | ORAL_TABLET | Freq: Three times a day (TID) | ORAL | Status: DC | PRN

## 2015-11-25 MED ORDER — DOXYCYCLINE HYCLATE 100 MG PO CAPS: 100.0000 mg | ORAL_CAPSULE | Freq: Two times a day (BID) | ORAL | Status: DC

## 2015-11-25 MED ORDER — CYCLOBENZAPRINE HCL 10 MG PO TABS: 10.0000 mg | ORAL_TABLET | Freq: Two times a day (BID) | ORAL | Status: DC | PRN

## 2015-11-25 NOTE — MAU Provider Note (Signed)
History     CSN: [id number]  Arrival date and time: 11/25/15 1646   First Provider Initiated Contact with Patient 11/25/15 1724         Chief Complaint  Patient presents with  . Pelvic Pain  . Vaginal Discharge  . Back Pain   Kelly Buck is a 35 y.o. female who was sent here by her PCP for evaluation. Went to PCP 3 days ago for chronic "uterine" pain & new "kidney" pain (points to low right back). Sent for CT yesterday. PCP called her about her CT results, were normal; per patient, PCP sent her here for an "emergency pelvic ultrasound".   Abdominal Pain This is a recurrent problem. The current episode started more than 1 year ago (since endometritis s/p retained placenta 05/2014). The problem occurs constantly. The problem has been unchanged. The pain is located in the suprapubic region. The pain is at a severity of 6/10. The quality of the pain is sharp. The abdominal pain radiates to the pelvis. Associated symptoms include nausea. Pertinent negatives include no anorexia, constipation, diarrhea, dysuria, fever, frequency, hematuria or vomiting. Associated symptoms comments: chills. The pain is aggravated by certain positions (lying down). The pain is relieved by nothing. She has tried oral narcotic analgesics for the symptoms. The treatment provided mild relief. Prior diagnostic workup includes CT scan (ordered by PCP, 11/24/15, normal). Her past medical history is significant for abdominal surgery. There is no history of Crohn's disease or irritable bowel syndrome.  Back Pain This is a new problem. Episode onset: 3 days. The problem occurs constantly. The problem is unchanged. The pain is present in the lumbar spine. The quality of the pain is described as aching. The pain does not radiate. The pain is at a severity of 6/10. Exacerbated by: nothing. Associated symptoms include abdominal pain and pelvic pain. Pertinent negatives include no bladder incontinence, bowel incontinence, dysuria,  fever, leg pain, tingling or weakness. Risk factors include obesity. She has tried analgesics for the symptoms. The treatment provided mild relief.  Female GU Problem The patient's primary symptoms include a genital odor, pelvic pain and vaginal discharge. The patient's pertinent negatives include no genital itching, genital lesions or vaginal bleeding. This is a new problem. The current episode started yesterday. The problem has been unchanged. She is not pregnant. Associated symptoms include abdominal pain, back pain, chills (didn't check temp. Has occurred multiple times in the last 3 weeks), flank pain and nausea. Pertinent negatives include no anorexia, constipation, diarrhea, discolored urine, dysuria, fever, frequency, hematuria, urgency or vomiting. The vaginal discharge was malodorous, mucoid and clear. There has been no bleeding. Nothing aggravates the symptoms. She is not sexually active (in over 2 months). She uses nothing for contraception. Her menstrual history has been regular. Her past medical history is significant for an abdominal surgery and a gynecological surgery (D&C for retained placenta in 2015). There is no history of a Cesarean section, endometriosis, PID or an STD.    OB History    Gravida Para Term Preterm AB TAB SAB Ectopic Multiple Living   5 4 4  1  1   4       Past Medical History  Diagnosis Date  . Anxiety   . Vaginal Pap smear, abnormal     Past Surgical History  Procedure Laterality Date  . Placement of breast implants    . Carpal tunnel release    . Tummy tuck    . Dilation and evacuation N/A 06/01/2014  Procedure: DILATATION AND EVACUATION;  Surgeon: [name], MD;  Location: [facility];  Service: Gynecology;  Laterality: N/A;  . Dilation and evacuation N/A 06/04/2014    Procedure: DILATATION AND EVACUATION;  Surgeon: [name], MD;  Location: [facility];  Service: Gynecology;  Laterality: N/A;  . Cervical biopsy      History reviewed. No  pertinent family history.  Social History  Substance Use Topics  . Smoking status: Never Smoker   . Smokeless tobacco: Never Used  . Alcohol Use: No    Allergies: No Known Allergies  Prescriptions prior to admission  Medication Sig Dispense Refill Last Dose  . ciprofloxacin (CIPRO) 500 MG tablet Take 500 mg by mouth 2 (two) times daily.   11/25/2015 at Unknown time  . HYDROcodone-acetaminophen (NORCO/VICODIN) 5-325 MG tablet Take 1 tablet by mouth every 6 (six) hours as needed for moderate pain.   11/25/2015 at Unknown time  . ibuprofen (ADVIL,MOTRIN) 200 MG tablet Take 800 mg by mouth every 6 (six) hours as needed for moderate pain.   Past Week at Unknown time  . PARoxetine (PAXIL) 20 MG tablet Take 20 mg by mouth daily.   Past Week at Unknown time    Review of Systems  Constitutional: Positive for chills (didn't check temp. Has occurred multiple times in the last 3 weeks). Negative for fever.  Gastrointestinal: Positive for nausea and abdominal pain. Negative for vomiting, diarrhea, constipation, anorexia and bowel incontinence.  Genitourinary: Positive for flank pain, vaginal discharge and pelvic pain. Negative for bladder incontinence, dysuria, urgency, frequency and hematuria.       + vaginal discharge with odor No vaginal bleeding  Musculoskeletal: Positive for back pain.  Neurological: Negative for tingling and weakness.   Physical Exam   Blood pressure 115/86, pulse 62, temperature 98.1 F (36.7 C), temperature source Oral, resp. rate 18, height 5' 5.5" (1.664 m), weight 223 lb (101.152 kg), last menstrual period 11/17/2015, currently breastfeeding.  Physical Exam  Nursing note and vitals reviewed. Constitutional: She is oriented to person, place, and time. She appears well-developed and well-nourished. No distress.  HENT:  Head: Normocephalic and atraumatic.  Eyes: Conjunctivae are normal. Right eye exhibits no discharge. Left eye exhibits no discharge. No scleral icterus.   Neck: Normal range of motion.  Cardiovascular: Normal rate, regular rhythm and normal heart sounds.   No murmur heard. Respiratory: Effort normal and breath sounds normal. No respiratory distress. She has no wheezes.  GI: Soft. Bowel sounds are normal. There is tenderness in the suprapubic area. There is no rebound, no CVA tenderness and negative Murphy's sign.  Genitourinary: Vagina normal. Cervix exhibits motion tenderness and discharge (moderate amount of thin tan discharge with foul odor). Cervix exhibits no friability.  Neurological: She is alert and oriented to person, place, and time.  Skin: Skin is warm and dry. She is not diaphoretic.  Psychiatric: She has a normal mood and affect. Her behavior is normal. Judgment and thought content normal.    MAU Course  Procedures Results for orders placed or performed during the hospital encounter of 11/25/15 (from the past 24 hour(s))  Urinalysis, Routine w reflex microscopic (not at [facility])     Status: Abnormal   Collection Time: 11/25/15  5:00 PM  Result Value Ref Range   Color, Urine YELLOW YELLOW   APPearance HAZY (A) CLEAR   Specific Gravity, Urine 1.025 1.005 - 1.030   pH 5.5 5.0 - 8.0   Glucose, UA NEGATIVE NEGATIVE mg/dL   Hgb  urine dipstick NEGATIVE NEGATIVE   Bilirubin Urine NEGATIVE NEGATIVE   Ketones, ur NEGATIVE NEGATIVE mg/dL   Protein, ur NEGATIVE NEGATIVE mg/dL   Nitrite NEGATIVE NEGATIVE   Leukocytes, UA MODERATE (A) NEGATIVE  Urine microscopic-add on     Status: Abnormal   Collection Time: 11/25/15  5:00 PM  Result Value Ref Range   Squamous Epithelial / LPF 0-5 (A) NONE SEEN   WBC, UA 0-5 0 - 5 WBC/hpf   RBC / HPF 0-5 0 - 5 RBC/hpf   Bacteria, UA NONE SEEN NONE SEEN  Pregnancy, urine POC     Status: None   Collection Time: 11/25/15  5:05 PM  Result Value Ref Range   Preg Test, Ur NEGATIVE NEGATIVE  Wet prep, genital     Status: Abnormal   Collection Time: 11/25/15  5:51 PM  Result Value Ref Range   Yeast Wet  Prep HPF POC NONE SEEN NONE SEEN   Trich, Wet Prep NONE SEEN NONE SEEN   Clue Cells Wet Prep HPF POC PRESENT (A) NONE SEEN   WBC, Wet Prep HPF POC MODERATE (A) NONE SEEN   Sperm NONE SEEN   CBC     Status: None   Collection Time: 11/25/15  6:35 PM  Result Value Ref Range   WBC 10.2 4.0 - 10.5 K/uL   RBC 4.38 3.87 - 5.11 MIL/uL   Hemoglobin 13.4 12.0 - 15.0 g/dL   HCT 39.7 36.0 - 46.0 %   MCV 90.6 78.0 - 100.0 fL   MCH 30.6 26.0 - 34.0 pg   MCHC 33.8 30.0 - 36.0 g/dL   RDW 12.6 11.5 - 15.5 %   Platelets 357 150 - 400 K/uL  Comprehensive metabolic panel     Status: Abnormal   Collection Time: 11/25/15  6:35 PM  Result Value Ref Range   Sodium 135 135 - 145 mmol/L   Potassium 3.9 3.5 - 5.1 mmol/L   Chloride 99 (L) 101 - 111 mmol/L   CO2 26 22 - 32 mmol/L   Glucose, Bld 121 (H) 65 - 99 mg/dL   BUN 9 6 - 20 mg/dL   Creatinine, Ser 0.79 0.44 - 1.00 mg/dL   Calcium 8.8 (L) 8.9 - 10.3 mg/dL   Total Protein 6.8 6.5 - 8.1 g/dL   Albumin 3.9 3.5 - 5.0 g/dL   AST 26 15 - 41 U/L   ALT 39 14 - 54 U/L   Alkaline Phosphatase 40 38 - 126 U/L   Total Bilirubin 0.5 0.3 - 1.2 mg/dL   GFR calc non Af Amer >60 >60 mL/min   GFR calc Af Amer >60 >60 mL/min   Anion gap 10 5 - 15    MDM UPT negative CT from yesterday reviewed, normal.  Will treat for PID based on symptoms, CMT, & moderate WBCs on wet prep.  Rocephin 250 mg IM in MAU & doxycycline 100 mg BID x 14 days  Ultrasound normal GC/CT pending Patient referred to a gyn by her PCP & has appt with them 2/20. "Kidney pain" is likely musculoskeletal as the pain is in lower back. No hematuria. Kidneys normal on CT. Will send urine for culture Assessment and Plan  A: 1. PID (pelvic inflammatory disease)   2. Cervical motion tenderness   3. Pelvic pain in female   4. Right-sided low back pain without sciatica     P: Discharge home  Rx doxycycline, zofran, & flexeril Urine culture pending GC/CT pending F/u with PCP or gyn  if symptoms  don't improve  Jorje Guild, NP   11/25/2015, 5:19 PM

## 2015-11-25 NOTE — Discharge Instructions (Signed)
Pelvic Pain, Female °Female pelvic pain can be caused by many different things and start from a variety of places. Pelvic pain refers to pain that is located in the lower half of the abdomen and between your hips. The pain may occur over a short period of time (acute) or may be reoccurring (chronic). The cause of pelvic pain may be related to disorders affecting the female reproductive organs (gynecologic), but it may also be related to the bladder, kidney stones, an intestinal complication, or muscle or skeletal problems. Getting help right away for pelvic pain is important, especially if there has been severe, sharp, or a sudden onset of unusual pain. It is also important to get help right away because some types of pelvic pain can be life threatening.  °CAUSES  °Below are only some of the causes of pelvic pain. The causes of pelvic pain can be in one of several categories.  °· Gynecologic. °¨ Pelvic inflammatory disease. °¨ Sexually transmitted infection. °¨ Ovarian cyst or a twisted ovarian ligament (ovarian torsion). °¨ Uterine lining that grows outside the uterus (endometriosis). °¨ Fibroids, cysts, or tumors. °¨ Ovulation. °· Pregnancy. °¨ Pregnancy that occurs outside the uterus (ectopic pregnancy). °¨ Miscarriage. °¨ Labor. °¨ Abruption of the placenta or ruptured uterus. °· Infection. °¨ Uterine infection (endometritis). °¨ Bladder infection. °¨ Diverticulitis. °¨ Miscarriage related to a uterine infection (septic abortion). °· Bladder. °¨ Inflammation of the bladder (cystitis). °¨ Kidney stone(s). °· Gastrointestinal. °¨ Constipation. °¨ Diverticulitis. °· Neurologic. °¨ Trauma. °¨ Feeling pelvic pain because of mental or emotional causes (psychosomatic). °· Cancers of the bowel or pelvis. °EVALUATION  °Your caregiver will want to take a careful history of your concerns. This includes recent changes in your health, a careful gynecologic history of your periods (menses), and a sexual history. Obtaining  your family history and medical history is also important. Your caregiver may suggest a pelvic exam. A pelvic exam will help identify the location and severity of the pain. It also helps in the evaluation of which organ system may be involved. In order to identify the cause of the pelvic pain and be properly treated, your caregiver may order tests. These tests may include:  °· A pregnancy test. °· Pelvic ultrasonography. °· An X-ray exam of the abdomen. °· A urinalysis or evaluation of vaginal discharge. °· Blood tests. °HOME CARE INSTRUCTIONS  °· Only take over-the-counter or prescription medicines for pain, discomfort, or fever as directed by your caregiver.   °· Rest as directed by your caregiver.   °· Eat a balanced diet.   °· Drink enough fluids to make your urine clear or pale yellow, or as directed.   °· Avoid sexual intercourse if it causes pain.   °· Apply warm or cold compresses to the lower abdomen depending on which one helps the pain.   °· Avoid stressful situations.   °· Keep a journal of your pelvic pain. Write down when it started, where the pain is located, and if there are things that seem to be associated with the pain, such as food or your menstrual cycle. °· Follow up with your caregiver as directed.   °SEEK MEDICAL CARE IF: °· Your medicine does not help your pain. °· You have abnormal vaginal discharge. °SEEK IMMEDIATE MEDICAL CARE IF:  °· You have heavy bleeding from the vagina.   °· Your pelvic pain increases.   °· You feel light-headed or faint.   °· You have chills.   °· You have pain with urination or blood in your urine.   °· You have uncontrolled   diarrhea or vomiting.   You have a fever or persistent symptoms for more than 3 days.  You have a fever and your symptoms suddenly get worse.   You are being physically or sexually abused.   This information is not intended to replace advice given to you by your health care provider. Make sure you discuss any questions you have with  your health care provider.   Document Released: 09/04/2004 Document Revised: 06/29/2015 Document Reviewed: 01/28/2012 Elsevier Interactive Patient Education 2016 Else[employer] Pelvic Inflammatory Disease Pelvic inflammatory disease (PID) refers to an infection in some or all of the female organs. The infection can be in the uterus, ovaries, fallopian tubes, or the surrounding tissues in the pelvis. PID can cause abdominal or pelvic pain that comes on suddenly (acute pelvic pain). PID is a serious infection because it can lead to lasting (chronic) pelvic pain or the inability to have children (infertility). CAUSES This condition is most often caused by an infection that is spread during sexual contact. However, the infection can also be caused by the normal bacteria that are found in the vaginal tissues if these bacteria travel upward into the reproductive organs. PID can also occur following:  The birth of a baby.  A miscarriage.  An abortion.  Major pelvic surgery.  The use of an intrauterine device (IUD).  A sexual assault. RISK FACTORS This condition is more likely to develop in women who:  Are younger than 36 years of age.  Are sexually active at [facility] age.  Use nonbarrier contraception.  Have multiple sexual partners.  Have sex with someone who has symptoms of an STD (sexually transmitted disease).  Use oral contraception. At times, certain behaviors can also increase the possibility of getting PID, such as:  Using a vaginal douche.  Having an IUD in place. SYMPTOMS Symptoms of this condition include:  Abdominal or pelvic pain.  Fever.  Chills.  Abnormal vaginal discharge.  Abnormal uterine bleeding.  Unusual pain shortly after the end of a menstrual period.  Painful urination.  Pain with sexual intercourse.  Nausea and vomiting. DIAGNOSIS To diagnose this condition, your health care provider will do a physical exam and take your medical history. A  pelvic exam typically reveals great tenderness in the uterus and the surrounding pelvic tissues. You may also have tests, such as:  Lab tests, including a pregnancy test, blood tests, and urine test.  Culture tests of the vagina and cervix to check for an STD.  Ultrasound.  A laparoscopic procedure to look inside the pelvis.  Examining vaginal secretions under a microscope. TREATMENT Treatment for this condition may involve one or more approaches.  Antibiotic medicines may be prescribed to be taken by mouth.  Sexual partners may need to be treated if the infection is caused by an STD.  For more severe cases, hospitalization may be needed to give antibiotics directly into a vein through an IV tube.  Surgery may be needed if other treatments do not help, but this is rare. It may take weeks until you are completely well. If you are diagnosed with PID, you should also be checked for human immunodeficiency virus (HIV). Your health care provider may test you for infection again 3 months after treatment. You should not have unprotected sex. HOME CARE INSTRUCTIONS  Take over-the-counter and prescription medicines only as told by your health care provider.  If you were prescribed an antibiotic medicine, take it as told by your health care provider. Do not stop taking  the antibiotic even if you start to feel better.  Do not have sexual intercourse until treatment is completed or as told by your health care provider. If PID is confirmed, your recent sexual partners will need treatment, especially if you had unprotected sex.  Keep all follow-up visits as told by your health care provider. This is important. SEEK MEDICAL CARE IF:  You have increased or abnormal vaginal discharge.  Your pain does not improve.  You vomit.  You have a fever.  You cannot tolerate your medicines.  Your partner has an STD.  You have pain when you urinate. SEEK IMMEDIATE MEDICAL CARE IF:  You have  increased abdominal or pelvic pain.  You have chills.  Your symptoms are not better in 72 hours even with treatment.   This information is not intended to replace advice given to you by your health care provider. Make sure you discuss any questions you have with your health care provider.   Document Released: 10/08/2005 Document Revised: 06/29/2015 Document Reviewed: 11/15/2014 Elsevier Interactive Patient Education [employer].

## 2015-11-25 NOTE — MAU Note (Signed)
Patient presents with having right side kidney pain went to family doctor was told did not think had kidney stone had CT scan, MD wanted her to come to MAU for evaluation, having pain in uterus, taking an antibiotic (day 3)  and Hydrocodone for pain, foul smelling discharge.

## 2015-11-27 LAB — URINE CULTURE: CULTURE: NO GROWTH

## 2015-11-28 ENCOUNTER — Other Ambulatory Visit: Payer: Self-pay | Admitting: Family Medicine

## 2015-11-28 DIAGNOSIS — R102 Pelvic and perineal pain: Secondary | ICD-10-CM

## 2015-11-28 LAB — GC/CHLAMYDIA PROBE AMP (~~LOC~~) NOT AT ARMC
Chlamydia: NEGATIVE
Neisseria Gonorrhea: NEGATIVE

## 2016-09-05 ENCOUNTER — Other Ambulatory Visit: Payer: Self-pay | Admitting: Family

## 2016-09-05 ENCOUNTER — Ambulatory Visit
Admission: RE | Admit: 2016-09-05 | Discharge: 2016-09-05 | Disposition: A | Discharge status: Home / Self Care | Payer: PRIVATE HEALTH INSURANCE | Source: Ambulatory Visit | Attending: Family | Admitting: Family

## 2016-09-05 DIAGNOSIS — R52 Pain, unspecified: Secondary | ICD-10-CM

## 2016-09-05 DIAGNOSIS — M545 Low back pain: Secondary | ICD-10-CM | POA: Diagnosis present

## 2016-10-08 ENCOUNTER — Other Ambulatory Visit: Payer: Self-pay | Admitting: Family

## 2016-10-08 DIAGNOSIS — M5416 Radiculopathy, lumbar region: Secondary | ICD-10-CM

## 2016-10-10 ENCOUNTER — Other Ambulatory Visit: Payer: Self-pay | Admitting: Occupational Medicine

## 2016-10-10 DIAGNOSIS — M5416 Radiculopathy, lumbar region: Secondary | ICD-10-CM

## 2016-10-11 ENCOUNTER — Ambulatory Visit (HOSPITAL_BASED_OUTPATIENT_CLINIC_OR_DEPARTMENT_OTHER)
Admission: RE | Admit: 2016-10-11 | Discharge: 2016-10-11 | Disposition: A | Discharge status: Home / Self Care | Payer: PRIVATE HEALTH INSURANCE | Source: Ambulatory Visit | Attending: Occupational Medicine | Admitting: Occupational Medicine

## 2016-10-11 DIAGNOSIS — M5416 Radiculopathy, lumbar region: Secondary | ICD-10-CM | POA: Diagnosis not present

## 2016-10-11 DIAGNOSIS — M1288 Other specific arthropathies, not elsewhere classified, other specified site: Secondary | ICD-10-CM | POA: Diagnosis not present

## 2016-10-18 ENCOUNTER — Ambulatory Visit: Payer: PRIVATE HEALTH INSURANCE

## 2016-10-26 ENCOUNTER — Other Ambulatory Visit: Payer: Self-pay | Admitting: Orthopedic Surgery

## 2016-10-29 MED FILL — diazePAM 5 MG TABS: 5 | 8 days supply | Qty: 30 | Fill #0

## 2016-10-29 MED FILL — OXYCODONE W/APAP 5/325 TAB: 5-325 | 3 days supply | Qty: 20 | Fill #0

## 2016-10-30 ENCOUNTER — Encounter (HOSPITAL_COMMUNITY): Payer: Self-pay | Admitting: *Deleted

## 2016-10-30 NOTE — Progress Notes (Addendum)
Kelly Buck reports a history of palpations.  Patient reports a cardiology work up in 2006, while she was pregnant. "They didn't find anything, and I had a follow up after I delivered and they said that they did not need to do any further work up." Patient states that she drinks 2 cups of coffee in am and has noticed if she takes medication or anything with caffeine that she will have palpations." Patient reported that palpations come and go quickly, it takes my breath for a second and then it is gone." Patient can not remember when it last happened and does not remember who the cardiologist was.  I found notes from 01/2005, patient was seen by Dr Jenkins Rouge- he noted results and requested to see her again before baby was born.

## 2016-10-30 NOTE — H&P (Signed)
     PREOPERATIVE H&P  Chief Complaint: R leg pain  HPI: Kelly Buck is a 36 y.o. female who presents with ongoing pain in the right leg  MRI reveals a moderate right L5/S1 HNP  Patient has failed multiple forms of conservative care and continues to have pain (see office notes for additional details regarding the patient's full course of treatment)  Past Medical History:  Diagnosis Date  . Anxiety   . Vaginal Pap smear, abnormal    Past Surgical History:  Procedure Laterality Date  . CARPAL TUNNEL RELEASE    . CERVICAL BIOPSY    . DILATION AND EVACUATION N/A 06/01/2014   Procedure: DILATATION AND EVACUATION;  Surgeon: [name], MD;  Location: [facility];  Service: Gynecology;  Laterality: N/A;  . DILATION AND EVACUATION N/A 06/04/2014   Procedure: DILATATION AND EVACUATION;  Surgeon: [name], MD;  Location: [facility];  Service: Gynecology;  Laterality: N/A;  . PLACEMENT OF BREAST IMPLANTS    . tummy tuck     Social History   Social History  . Marital status: Married    Spouse name: N/A  . Number of children: N/A  . Years of education: N/A   Social History Main Topics  . Smoking status: Never Smoker  . Smokeless tobacco: Never Used  . Alcohol use No  . Drug use: No  . Sexual activity: Yes   Other Topics Concern  . Not on file   Social History Narrative  . No narrative on file   No family history on file. No Known Allergies Prior to Admission medications   Medication Sig Start Date End Date Taking? Authorizing Provider  ibuprofen (ADVIL,MOTRIN) 200 MG tablet Take 800 mg by mouth every 6 (six) hours as needed for moderate pain.   Yes Historical Provider, MD  metaxalone (SKELAXIN) 800 MG tablet Take 1 tablet by mouth every 8 (eight) hours as needed. 10/05/16  Yes Historical Provider, MD  traMADol (ULTRAM) 50 MG tablet Take 1 tablet by mouth every 8 (eight) hours as needed. 10/12/16  Yes Historical Provider, MD     All other systems have been  reviewed and were otherwise negative with the exception of those mentioned in the HPI and as above.  Physical Exam: There were no vitals filed for this visit.  General: Alert, no acute distress Cardiovascular: No pedal edema Respiratory: No cyanosis, no use of accessory musculature Skin: No lesions in the area of chief complaint Neurologic: Sensation intact distally Psychiatric: Patient is competent for consent with normal mood and affect Lymphatic: No axillary or cervical lymphadenopathy  MUSCULOSKELETAL: + SLR on the right  Assessment/Plan: Right sided lumbar radiculopathy Plan for Procedure(s): RIGHT SIDED LUMBAR 5-SACRUM 1 MICRODISCECTOMY   Sinclair Ship, MD 10/30/2016 8:09 AM

## 2016-10-31 ENCOUNTER — Encounter (HOSPITAL_COMMUNITY): Payer: Self-pay | Admitting: *Deleted

## 2016-10-31 ENCOUNTER — Ambulatory Visit (HOSPITAL_COMMUNITY): Payer: PRIVATE HEALTH INSURANCE

## 2016-10-31 ENCOUNTER — Ambulatory Visit (HOSPITAL_COMMUNITY)
Admission: RE | Admit: 2016-10-31 | Discharge: 2016-10-31 | Disposition: A | Discharge status: Home / Self Care | Payer: PRIVATE HEALTH INSURANCE | Source: Ambulatory Visit | Attending: Orthopedic Surgery | Admitting: Orthopedic Surgery

## 2016-10-31 ENCOUNTER — Ambulatory Visit (HOSPITAL_COMMUNITY): Payer: PRIVATE HEALTH INSURANCE | Admitting: Anesthesiology

## 2016-10-31 ENCOUNTER — Encounter (HOSPITAL_COMMUNITY)
Admission: RE | Disposition: A | Discharge status: Home / Self Care | Payer: Self-pay | Source: Ambulatory Visit | Attending: Orthopedic Surgery

## 2016-10-31 DIAGNOSIS — M5418 Radiculopathy, sacral and sacrococcygeal region: Secondary | ICD-10-CM | POA: Insufficient documentation

## 2016-10-31 DIAGNOSIS — M79604 Pain in right leg: Secondary | ICD-10-CM | POA: Diagnosis present

## 2016-10-31 DIAGNOSIS — Z6838 Body mass index (BMI) 38.0-38.9, adult: Secondary | ICD-10-CM | POA: Insufficient documentation

## 2016-10-31 DIAGNOSIS — M5127 Other intervertebral disc displacement, lumbosacral region: Secondary | ICD-10-CM | POA: Diagnosis not present

## 2016-10-31 DIAGNOSIS — M5417 Radiculopathy, lumbosacral region: Secondary | ICD-10-CM | POA: Diagnosis not present

## 2016-10-31 DIAGNOSIS — M5410 Radiculopathy, site unspecified: Secondary | ICD-10-CM

## 2016-10-31 HISTORY — DX: Adverse effect of unspecified anesthetic, initial encounter: T41.45XA

## 2016-10-31 HISTORY — DX: Other specified postprocedural states: Z98.890

## 2016-10-31 HISTORY — PX: LUMBAR LAMINECTOMY/DECOMPRESSION MICRODISCECTOMY: SHX5026

## 2016-10-31 HISTORY — DX: Other complications of anesthesia, initial encounter: T88.59XA

## 2016-10-31 HISTORY — DX: Palpitations: R00.2

## 2016-10-31 HISTORY — DX: Nausea with vomiting, unspecified: R11.2

## 2016-10-31 LAB — URINALYSIS, ROUTINE W REFLEX MICROSCOPIC
BILIRUBIN URINE: NEGATIVE
Glucose, UA: NEGATIVE mg/dL
Ketones, ur: NEGATIVE mg/dL
NITRITE: NEGATIVE
Protein, ur: NEGATIVE mg/dL
SPECIFIC GRAVITY, URINE: 1.005 (ref 1.005–1.030)
pH: 7 (ref 5.0–8.0)

## 2016-10-31 LAB — CBC WITH DIFFERENTIAL/PLATELET
BASOS ABS: 0 10*3/uL (ref 0.0–0.1)
BASOS PCT: 0 %
EOS ABS: 0.3 10*3/uL (ref 0.0–0.7)
Eosinophils Relative: 4 %
HEMATOCRIT: 41.3 % (ref 36.0–46.0)
HEMOGLOBIN: 14 g/dL (ref 12.0–15.0)
Lymphocytes Relative: 36 %
Lymphs Abs: 3.1 10*3/uL (ref 0.7–4.0)
MCH: 30.2 pg (ref 26.0–34.0)
MCHC: 33.9 g/dL (ref 30.0–36.0)
MCV: 89 fL (ref 78.0–100.0)
Monocytes Absolute: 0.4 10*3/uL (ref 0.1–1.0)
Monocytes Relative: 5 %
NEUTROS ABS: 4.8 10*3/uL (ref 1.7–7.7)
NEUTROS PCT: 55 %
Platelets: 340 10*3/uL (ref 150–400)
RBC: 4.64 MIL/uL (ref 3.87–5.11)
RDW: 12.8 % (ref 11.5–15.5)
WBC: 8.6 10*3/uL (ref 4.0–10.5)

## 2016-10-31 LAB — COMPREHENSIVE METABOLIC PANEL
ALBUMIN: 3.8 g/dL (ref 3.5–5.0)
ALK PHOS: 34 U/L — AB (ref 38–126)
ALT: 31 U/L (ref 14–54)
AST: 35 U/L (ref 15–41)
Anion gap: 11 (ref 5–15)
BILIRUBIN TOTAL: 1 mg/dL (ref 0.3–1.2)
BUN: 9 mg/dL (ref 6–20)
CALCIUM: 9 mg/dL (ref 8.9–10.3)
CO2: 20 mmol/L — AB (ref 22–32)
Chloride: 107 mmol/L (ref 101–111)
Creatinine, Ser: 0.7 mg/dL (ref 0.44–1.00)
GFR calc Af Amer: >60 mL/min (ref >60)
GFR calc non Af Amer: >60 mL/min (ref >60)
GLUCOSE: 107 mg/dL — AB (ref 65–99)
Potassium: 4.6 mmol/L (ref 3.5–5.1)
SODIUM: 138 mmol/L (ref 135–145)
TOTAL PROTEIN: 6.5 g/dL (ref 6.5–8.1)

## 2016-10-31 LAB — HCG, SERUM, QUALITATIVE: PREG SERUM: NEGATIVE

## 2016-10-31 LAB — PROTIME-INR
INR: 0.97
Prothrombin Time: 12.9 seconds (ref 11.4–15.2)

## 2016-10-31 LAB — APTT: APTT: 30 s (ref 24–36)

## 2016-10-31 SURGERY — LUMBAR LAMINECTOMY/DECOMPRESSION MICRODISCECTOMY
Anesthesia: General | Laterality: Right

## 2016-10-31 MED ORDER — PROPOFOL 10 MG/ML IV BOLUS
INTRAVENOUS | Status: DC | PRN
  Administered 2016-10-31: 50 mg via INTRAVENOUS
  Administered 2016-10-31: 150 mg via INTRAVENOUS

## 2016-10-31 MED ORDER — PROMETHAZINE HCL 25 MG/ML IJ SOLN
INTRAMUSCULAR | Status: AC
  Filled 2016-10-31: qty 1

## 2016-10-31 MED ORDER — HEMOSTATIC AGENTS (NO CHARGE) OPTIME
TOPICAL | Status: DC | PRN
  Administered 2016-10-31: 1 via TOPICAL

## 2016-10-31 MED ORDER — BUPIVACAINE-EPINEPHRINE 0.25% -1:200000 IJ SOLN
INTRAMUSCULAR | Status: DC | PRN
  Administered 2016-10-31: 20 mL
  Administered 2016-10-31: 5 mL

## 2016-10-31 MED ORDER — LACTATED RINGERS IV SOLN
INTRAVENOUS | Status: DC
Start: 2016-10-31 — End: 2016-10-31
  Administered 2016-10-31 (×3): via INTRAVENOUS

## 2016-10-31 MED ORDER — PROMETHAZINE HCL 25 MG/ML IJ SOLN
6.2500 mg | INTRAMUSCULAR | Status: DC | PRN
  Administered 2016-10-31: 6.25 mg via INTRAVENOUS

## 2016-10-31 MED ORDER — METHYLPREDNISOLONE ACETATE 40 MG/ML IJ SUSP
INTRAMUSCULAR | Status: AC
  Filled 2016-10-31: qty 1

## 2016-10-31 MED ORDER — FENTANYL CITRATE (PF) 100 MCG/2ML IJ SOLN
25.0000 ug | INTRAMUSCULAR | Status: DC | PRN
  Administered 2016-10-31 (×3): 50 ug via INTRAVENOUS

## 2016-10-31 MED ORDER — SCOPOLAMINE 1 MG/3DAYS TD PT72
1.0000 | MEDICATED_PATCH | Freq: Once | TRANSDERMAL | Status: AC
  Administered 2016-10-31: 1 via TRANSDERMAL

## 2016-10-31 MED ORDER — ONDANSETRON HCL 4 MG/2ML IJ SOLN
INTRAMUSCULAR | Status: DC | PRN
  Administered 2016-10-31: 4 mg via INTRAVENOUS

## 2016-10-31 MED ORDER — PROPOFOL 10 MG/ML IV BOLUS
INTRAVENOUS | Status: AC
  Filled 2016-10-31: qty 20

## 2016-10-31 MED ORDER — METHYLENE BLUE 0.5 % INJ SOLN
INTRAVENOUS | Status: AC
  Filled 2016-10-31: qty 10

## 2016-10-31 MED ORDER — LIDOCAINE HCL (CARDIAC) 20 MG/ML IV SOLN
INTRAVENOUS | Status: DC | PRN
  Administered 2016-10-31: 100 mg via INTRAVENOUS

## 2016-10-31 MED ORDER — PROMETHAZINE HCL 25 MG/ML IJ SOLN
INTRAMUSCULAR | Status: AC
  Administered 2016-10-31: 6.25 mg via INTRAVENOUS
  Filled 2016-10-31: qty 1

## 2016-10-31 MED ORDER — PHENYLEPHRINE 40 MCG/ML (10ML) SYRINGE FOR IV PUSH (FOR BLOOD PRESSURE SUPPORT)
PREFILLED_SYRINGE | INTRAVENOUS | Status: DC | PRN
  Administered 2016-10-31: 80 ug via INTRAVENOUS
  Administered 2016-10-31 (×2): 120 ug via INTRAVENOUS
  Administered 2016-10-31: 80 ug via INTRAVENOUS
  Administered 2016-10-31: 120 ug via INTRAVENOUS

## 2016-10-31 MED ORDER — FENTANYL CITRATE (PF) 100 MCG/2ML IJ SOLN
INTRAMUSCULAR | Status: AC
  Filled 2016-10-31: qty 4

## 2016-10-31 MED ORDER — EPHEDRINE 5 MG/ML INJ
INTRAVENOUS | Status: AC
  Filled 2016-10-31: qty 10

## 2016-10-31 MED ORDER — FENTANYL CITRATE (PF) 100 MCG/2ML IJ SOLN
INTRAMUSCULAR | Status: AC
  Administered 2016-10-31: 50 ug via INTRAVENOUS
  Filled 2016-10-31: qty 2

## 2016-10-31 MED ORDER — THROMBIN 20000 UNITS EX SOLR
CUTANEOUS | Status: AC
  Filled 2016-10-31: qty 20000

## 2016-10-31 MED ORDER — DEXAMETHASONE SODIUM PHOSPHATE 4 MG/ML IJ SOLN
INTRAMUSCULAR | Status: DC | PRN
  Administered 2016-10-31: 4 mg via INTRAVENOUS

## 2016-10-31 MED ORDER — BUPIVACAINE LIPOSOME 1.3 % IJ SUSP
20.0000 mL | Freq: Once | INTRAMUSCULAR | Status: DC
  Filled 2016-10-31 (×2): qty 20

## 2016-10-31 MED ORDER — ONDANSETRON HCL 4 MG/2ML IJ SOLN
INTRAMUSCULAR | Status: AC
  Filled 2016-10-31: qty 2

## 2016-10-31 MED ORDER — FENTANYL CITRATE (PF) 100 MCG/2ML IJ SOLN
50.0000 ug | INTRAMUSCULAR | Status: DC | PRN
  Administered 2016-10-31: 50 ug via INTRAVENOUS

## 2016-10-31 MED ORDER — ONDANSETRON HCL 4 MG/2ML IJ SOLN
INTRAMUSCULAR | Status: AC
  Administered 2016-10-31: 4 mg via INTRAVENOUS
  Filled 2016-10-31: qty 2

## 2016-10-31 MED ORDER — EPINEPHRINE PF 1 MG/ML IJ SOLN
INTRAMUSCULAR | Status: AC
  Filled 2016-10-31: qty 1

## 2016-10-31 MED ORDER — SUCCINYLCHOLINE CHLORIDE 200 MG/10ML IV SOSY
PREFILLED_SYRINGE | INTRAVENOUS | Status: AC
  Filled 2016-10-31: qty 10

## 2016-10-31 MED ORDER — THROMBIN 20000 UNITS EX SOLR
CUTANEOUS | Status: DC | PRN
  Administered 2016-10-31: 20 mL via TOPICAL

## 2016-10-31 MED ORDER — FENTANYL CITRATE (PF) 100 MCG/2ML IJ SOLN
INTRAMUSCULAR | Status: DC | PRN
  Administered 2016-10-31: 200 ug via INTRAVENOUS
  Administered 2016-10-31: 50 ug via INTRAVENOUS

## 2016-10-31 MED ORDER — BUPIVACAINE HCL (PF) 0.25 % IJ SOLN
INTRAMUSCULAR | Status: AC
  Filled 2016-10-31: qty 30

## 2016-10-31 MED ORDER — METHYLPREDNISOLONE ACETATE 40 MG/ML IJ SUSP
INTRAMUSCULAR | Status: DC | PRN
  Administered 2016-10-31: 40 mg

## 2016-10-31 MED ORDER — 0.9 % SODIUM CHLORIDE (POUR BTL) OPTIME
TOPICAL | Status: DC | PRN
  Administered 2016-10-31: 1000 mL

## 2016-10-31 MED ORDER — BUPIVACAINE LIPOSOME 1.3 % IJ SUSP
INTRAMUSCULAR | Status: DC | PRN
  Administered 2016-10-31: 20 mL

## 2016-10-31 MED ORDER — SUGAMMADEX SODIUM 500 MG/5ML IV SOLN
INTRAVENOUS | Status: DC | PRN
  Administered 2016-10-31: 200 mg via INTRAVENOUS

## 2016-10-31 MED ORDER — ONDANSETRON HCL 4 MG/2ML IJ SOLN
4.0000 mg | Freq: Once | INTRAMUSCULAR | Status: AC
  Administered 2016-10-31: 4 mg via INTRAVENOUS

## 2016-10-31 MED ORDER — METHYLENE BLUE 0.5 % INJ SOLN
INTRAVENOUS | Status: DC | PRN
  Administered 2016-10-31: .03 mL via SUBMUCOSAL

## 2016-10-31 MED ORDER — POVIDONE-IODINE 7.5 % EX SOLN: Freq: Once | CUTANEOUS | Status: DC

## 2016-10-31 MED ORDER — PHENYLEPHRINE HCL 10 MG/ML IJ SOLN
INTRAMUSCULAR | Status: DC | PRN
  Administered 2016-10-31: 20 ug/min via INTRAVENOUS

## 2016-10-31 MED ORDER — CEFAZOLIN SODIUM-DEXTROSE 2-4 GM/100ML-% IV SOLN
2.0000 g | INTRAVENOUS | Status: AC
  Administered 2016-10-31: 2 g via INTRAVENOUS

## 2016-10-31 MED ORDER — CEFAZOLIN SODIUM-DEXTROSE 2-4 GM/100ML-% IV SOLN
INTRAVENOUS | Status: AC
  Filled 2016-10-31: qty 100

## 2016-10-31 MED ORDER — ROCURONIUM BROMIDE 100 MG/10ML IV SOLN
INTRAVENOUS | Status: DC | PRN
  Administered 2016-10-31 (×2): 10 mg via INTRAVENOUS
  Administered 2016-10-31: 50 mg via INTRAVENOUS

## 2016-10-31 MED ORDER — MIDAZOLAM HCL 2 MG/2ML IJ SOLN
INTRAMUSCULAR | Status: AC
  Filled 2016-10-31: qty 2

## 2016-10-31 MED ORDER — MIDAZOLAM HCL 5 MG/5ML IJ SOLN
INTRAMUSCULAR | Status: DC | PRN
  Administered 2016-10-31: 2 mg via INTRAVENOUS

## 2016-10-31 SURGICAL SUPPLY — 73 items
APL SKNCLS STERI-STRIP NONHPOA (GAUZE/BANDAGES/DRESSINGS) ×1
BENZOIN TINCTURE PRP APPL 2/3 (GAUZE/BANDAGES/DRESSINGS) ×1 IMPLANT
BNDG GAUZE ELAST 4 BULKY (GAUZE/BANDAGES/DRESSINGS) ×1 IMPLANT
BUR ROUND PRECISION 4.0 (BURR) ×2 IMPLANT
CANISTER SUCTION 2500CC (MISCELLANEOUS) ×2 IMPLANT
CARTRIDGE OIL MAESTRO DRILL (MISCELLANEOUS) ×1 IMPLANT
CORDS BIPOLAR (ELECTRODE) ×2 IMPLANT
COVER SURGICAL LIGHT HANDLE (MISCELLANEOUS) ×2 IMPLANT
DIFFUSER DRILL AIR PNEUMATIC (MISCELLANEOUS) ×2 IMPLANT
DRAIN CHANNEL 15F RND FF W/TCR (WOUND CARE) IMPLANT
DRAPE POUCH INSTRU U-SHP 10X18 (DRAPES) ×4 IMPLANT
DRAPE SURG 17X23 STRL (DRAPES) ×8 IMPLANT
DURAPREP 26ML APPLICATOR (WOUND CARE) ×2 IMPLANT
ELECT BLADE 4.0 EZ CLEAN MEGAD (MISCELLANEOUS) ×2
ELECT CAUTERY BLADE 6.4 (BLADE) ×2 IMPLANT
ELECT REM PT RETURN 9FT ADLT (ELECTROSURGICAL) ×2
ELECTRODE BLDE 4.0 EZ CLN MEGD (MISCELLANEOUS) ×1 IMPLANT
ELECTRODE REM PT RTRN 9FT ADLT (ELECTROSURGICAL) ×1 IMPLANT
EVACUATOR SILICONE 100CC (DRAIN) IMPLANT
FILTER STRAW FLUID ASPIR (MISCELLANEOUS) ×2 IMPLANT
GAUZE SPONGE 4X4 12PLY STRL (GAUZE/BANDAGES/DRESSINGS) ×2 IMPLANT
GAUZE SPONGE 4X4 16PLY XRAY LF (GAUZE/BANDAGES/DRESSINGS) ×4 IMPLANT
GLOVE BIO SURGEON STRL SZ7 (GLOVE) ×6 IMPLANT
GLOVE BIO SURGEON STRL SZ8 (GLOVE) ×2 IMPLANT
GLOVE BIOGEL PI IND STRL 7.0 (GLOVE) ×1 IMPLANT
GLOVE BIOGEL PI IND STRL 8 (GLOVE) ×1 IMPLANT
GLOVE BIOGEL PI INDICATOR 7.0 (GLOVE) ×2
GLOVE BIOGEL PI INDICATOR 8 (GLOVE) ×1
GOWN STRL REUS W/ TWL LRG LVL3 (GOWN DISPOSABLE) ×1 IMPLANT
GOWN STRL REUS W/ TWL XL LVL3 (GOWN DISPOSABLE) ×2 IMPLANT
GOWN STRL REUS W/TWL LRG LVL3 (GOWN DISPOSABLE) ×4
GOWN STRL REUS W/TWL XL LVL3 (GOWN DISPOSABLE) ×2
IV CATH 14GX2 1/4 (CATHETERS) ×2 IMPLANT
KIT BASIN OR (CUSTOM PROCEDURE TRAY) ×2 IMPLANT
KIT POSITION SURG JACKSON T1 (MISCELLANEOUS) ×2 IMPLANT
KIT ROOM TURNOVER OR (KITS) ×2 IMPLANT
NDL 18GX1X1/2 (RX/OR ONLY) (NEEDLE) ×1 IMPLANT
NDL HYPO 25GX1X1/2 BEV (NEEDLE) ×1 IMPLANT
NDL SPNL 18GX3.5 QUINCKE PK (NEEDLE) ×2 IMPLANT
NEEDLE 18GX1X1/2 (RX/OR ONLY) (NEEDLE) ×2 IMPLANT
NEEDLE 22X1 1/2 (OR ONLY) (NEEDLE) ×2 IMPLANT
NEEDLE HYPO 25GX1X1/2 BEV (NEEDLE) ×2 IMPLANT
NEEDLE SPNL 18GX3.5 QUINCKE PK (NEEDLE) ×4 IMPLANT
NS IRRIG 1000ML POUR BTL (IV SOLUTION) ×2 IMPLANT
OIL CARTRIDGE MAESTRO DRILL (MISCELLANEOUS) ×2
PACK LAMINECTOMY ORTHO (CUSTOM PROCEDURE TRAY) ×2 IMPLANT
PACK UNIVERSAL I (CUSTOM PROCEDURE TRAY) ×2 IMPLANT
PAD ARMBOARD 7.5X6 YLW CONV (MISCELLANEOUS) ×4 IMPLANT
PATTIES SURGICAL .5 X.5 (GAUZE/BANDAGES/DRESSINGS) IMPLANT
PATTIES SURGICAL .5 X1 (DISPOSABLE) ×2 IMPLANT
SPONGE INTESTINAL PEANUT (DISPOSABLE) ×2 IMPLANT
SPONGE SURGIFOAM ABS GEL 100 (HEMOSTASIS) ×2 IMPLANT
SPONGE SURGIFOAM ABS GEL SZ50 (HEMOSTASIS) ×2 IMPLANT
STRIP CLOSURE SKIN 1/2X4 (GAUZE/BANDAGES/DRESSINGS) ×1 IMPLANT
SURGIFLO TRUKIT (HEMOSTASIS) ×1 IMPLANT
SURGIFLO W/THROMBIN 8M KIT (HEMOSTASIS) IMPLANT
SUT MNCRL AB 4-0 PS2 18 (SUTURE) ×2 IMPLANT
SUT VIC AB 0 CT1 18XCR BRD 8 (SUTURE) IMPLANT
SUT VIC AB 0 CT1 27 (SUTURE)
SUT VIC AB 0 CT1 27XBRD ANBCTR (SUTURE) IMPLANT
SUT VIC AB 0 CT1 8-18 (SUTURE) ×2
SUT VIC AB 1 CT1 18XCR BRD 8 (SUTURE) ×1 IMPLANT
SUT VIC AB 1 CT1 8-18 (SUTURE) ×2
SUT VIC AB 2-0 CT2 18 VCP726D (SUTURE) ×2 IMPLANT
SYR 20CC LL (SYRINGE) ×3 IMPLANT
SYR BULB IRRIGATION 50ML (SYRINGE) ×2 IMPLANT
SYR CONTROL 10ML LL (SYRINGE) ×4 IMPLANT
SYR TB 1ML 26GX3/8 SAFETY (SYRINGE) ×4 IMPLANT
SYR TB 1ML LUER SLIP (SYRINGE) ×4 IMPLANT
TOWEL OR 17X24 6PK STRL BLUE (TOWEL DISPOSABLE) ×2 IMPLANT
TOWEL OR 17X26 10 PK STRL BLUE (TOWEL DISPOSABLE) ×2 IMPLANT
WATER STERILE IRR 1000ML POUR (IV SOLUTION) ×2 IMPLANT
YANKAUER SUCT BULB TIP NO VENT (SUCTIONS) ×2 IMPLANT

## 2016-10-31 NOTE — Transfer of Care (Signed)
Immediate Anesthesia Transfer of Care Note  Patient: Kelly Buck  Procedure(s) Performed: Procedure(s) with comments: RIGHT SIDED LUMBAR 5-SACRUM 1 MICRODISCECTOMY (Right) - RIGHT SIDED LUMBAR 5-SACRUM 1 MICRODISCECTOMY  Patient Location: PACU  Anesthesia Type:General  Level of Consciousness: awake, alert , oriented and patient cooperative  Airway & Oxygen Therapy: Patient Spontanous Breathing and Patient connected to nasal cannula oxygen  Post-op Assessment: Report given to RN, Post -op Vital signs reviewed and stable and Patient moving all extremities X 4  Post vital signs: Reviewed and stable  Last Vitals:  Vitals:   10/31/16 0909 10/31/16 1418  BP: 126/82 130/82  Pulse: 87 (!) 117  Resp: 18 16  Temp: 37.4 C 37 C    Last Pain:  Vitals:   10/31/16 0909  TempSrc: Oral  PainSc:       Patients Stated Pain Goal: 3 ([phone] [id number])  Complications: No apparent anesthesia complications

## 2016-10-31 NOTE — Anesthesia Procedure Notes (Signed)
Procedure Name: Intubation Date/Time: 10/31/2016 11:33 AM Performed by: Carney Living Pre-anesthesia Checklist: Patient identified, Emergency Drugs available, Suction available, Patient being monitored and Timeout performed Patient Re-evaluated:Patient Re-evaluated prior to inductionOxygen Delivery Method: Circle system utilized Preoxygenation: Pre-oxygenation with 100% oxygen Intubation Type: IV induction Ventilation: Mask ventilation without difficulty Laryngoscope Size: Mac and 4 Grade View: Grade I Tube type: Oral Tube size: 7.0 mm Number of attempts: 1 Airway Equipment and Method: Stylet Placement Confirmation: ETT inserted through vocal cords under direct vision,  positive ETCO2 and breath sounds checked- equal and bilateral Secured at: 21 cm Tube secured with: Tape Dental Injury: Teeth and Oropharynx as per pre-operative assessment

## 2016-10-31 NOTE — Anesthesia Preprocedure Evaluation (Addendum)
Anesthesia Evaluation  Patient identified by MRN, date of birth, ID band Patient awake    Reviewed: Allergy & Precautions, H&P , Patient's Chart, lab work & pertinent test results, reviewed documented beta blocker date and time   History of Anesthesia Complications (+) PONV  Airway Mallampati: III  TM Distance: >3 FB Neck ROM: full    Dental no notable dental hx. (+) Teeth Intact, Dental Advisory Given   Pulmonary    Pulmonary exam normal breath sounds clear to auscultation       Cardiovascular  Rhythm:regular Rate:Normal     Neuro/Psych    GI/Hepatic   Endo/Other  Morbid obesity  Renal/GU      Musculoskeletal   Abdominal   Peds  Hematology   Anesthesia Other Findings   Reproductive/Obstetrics                           Anesthesia Physical Anesthesia Plan  ASA: III  Anesthesia Plan: General   Post-op Pain Management:    Induction: Intravenous  Airway Management Planned: Oral ETT  Additional Equipment:   Intra-op Plan:   Post-operative Plan: Extubation in OR  Informed Consent: I have reviewed the patients History and Physical, chart, labs and discussed the procedure including the risks, benefits and alternatives for the proposed anesthesia with the patient or authorized representative who has indicated his/her understanding and acceptance.   Dental Advisory Given and Dental advisory given  Plan Discussed with: CRNA, Surgeon and Anesthesiologist  Anesthesia Plan Comments: (  Discussed general anesthesia, including possible nausea, instrumentation of airway, sore throat,pulmonary aspiration, etc. I asked if the were any outstanding questions, or  concerns before we proceeded.)       Anesthesia Quick Evaluation

## 2016-10-31 NOTE — Progress Notes (Signed)
Pt just voided so instructed to obtain speci if she goes again, voices understanding.

## 2016-10-31 NOTE — Progress Notes (Signed)
UA obtained and taken to the lab.

## 2016-11-01 ENCOUNTER — Encounter (HOSPITAL_COMMUNITY): Payer: Self-pay | Admitting: Orthopedic Surgery

## 2016-11-01 NOTE — Op Note (Signed)
NAME:  Kelly Buck, Kelly Buck                   ACCOUNT NO.:  MEDICAL RECORD NO.:  [id number]  LOCATION:                                 FACILITY:  PHYSICIAN:  Phylliss Bob, MD      DATE OF BIRTH:  [birth year]  DATE OF PROCEDURE:  10/31/2016                              OPERATIVE REPORT   PREOPERATIVE DIAGNOSES: 1. Right-sided S1 radiculopathy. 2. Right-sided L5-S1 disk protrusion, contacting and displacing the     right S1 nerve.  POSTOPERATIVE DIAGNOSES: 1. Right-sided S1 radiculopathy. 2. Right-sided L5-S1 disk protrusion, contacting and displacing the     right S1 nerve.  PROCEDURE:  Right-sided L5-S1 laminotomy with partial facetectomy and removal of broad-based protruded L5-S1 disk herniation.  SURGEON:  Phylliss Bob, MD  ASSISTANT:  Pricilla Holm, PA-C.  ANESTHESIA:  General endotracheal anesthesia.  COMPLICATIONS:  None.  DISPOSITION:  Stable.  ESTIMATED BLOOD LOSS:  Minimal.  INDICATIONS FOR SURGERY:  Briefly, [name] is a pleasant 36 year old female, who is status post a work related injury that did occur on August 07, 2016.  The patient has been having ongoing pain at the right side of her low back, extending into her right leg.  An MRI did clearly reveal right-sided L5-S1 disk protrusion, displacing the right S1 nerve. This finding did correlate to her symptoms.  Given her ongoing pain, we did discuss proceeding with the procedure reflected above.  The patient was fully aware of the risks and limitations and did elect to proceed.  OPERATIVE DETAILS:  On October 31, 2016, the patient was brought to Surgery and general endotracheal anesthesia was administered.  The patient was placed prone on a well-padded flat Jackson bed with Wilson frame.  Antibiotics were given.  A time-out procedure was performed. The back was prepped and draped, and a midline incision was made overlying the L5-S1 intervertebral space.  A self-retaining retractor was placed.  A lateral  intraoperative radiograph did confirm the appropriate operative level.  I then removed the medial and inferior aspect of the L5 lamina.  The ligamentum flavum was identified and removed.  The traversing right S1 nerve was identified and medially retracted.  I then was able to identify broad-based disk protrusion, clearly displacing the right S1 nerve.  I did apply downward pressure to the protrusion, and the herniation did not readily declare itself to be removed.  I then proceeded with using reverse angled Epstein curette and did apply downward force into the intervertebral space, after which point, multiple protruded fragments did displace themselves into the vertebral space, after which point they were uneventfully removed. There was a portion of the herniation, which was adherent to the posterolateral annulus.  I did need to take down a small portion of the posterolateral annulus in order to entirely decompress the traversing right S1 nerve.  In doing so, I was able to thoroughly and completely decompress the right S1 nerve.  The wound was then copiously irrigated. All bleeding was then controlled.  A 40 mg of Depo-Medrol was then introduced about the epidural space.  Wound was then closed in layers using #1 Vicryl followed by 2-0 Vicryl, followed by 4-0 Monocryl. Benzoin  and Steri-Strips were applied followed by sterile dressing.  All instrument counts were correct at the termination of the procedure.  Of note, Pricilla Holm was my assistant throughout surgery, and did aid in retraction, suctioning, and closure from start to finish.     Phylliss Bob, MD     MD/MEDQ  D:  10/31/2016  T:  11/01/2016  Job:  [id number]

## 2016-11-01 NOTE — Anesthesia Postprocedure Evaluation (Addendum)
Anesthesia Post Note  Patient: Kelly Buck  Procedure(s) Performed: Procedure(s) (LRB): RIGHT SIDED LUMBAR 5-SACRUM 1 MICRODISCECTOMY (Right)  Patient location during evaluation: PACU Anesthesia Type: General Level of consciousness: awake Pain management: satisfactory to patient Vital Signs Assessment: post-procedure vital signs reviewed and stable Respiratory status: spontaneous breathing Cardiovascular status: stable Anesthetic complications: no       Last Vitals:  Vitals:   10/31/16 1547 10/31/16 1605  BP: 122/72 (!) 141/99  Pulse: 89 76  Resp: 15 16  Temp:      Last Pain:  Vitals:   10/31/16 1550  TempSrc:   PainSc: [place]

## 2016-11-02 MED FILL — METHOCARBAMOL 500 MG TABLET: 500 | 15 days supply | Qty: 60 | Fill #0

## 2016-11-12 MED FILL — traMADol HCL 50 MG TABS: 50 | 8 days supply | Qty: 60 | Fill #0

## 2016-11-12 MED FILL — METHYLPREDNISOLONE 4 MG TAB: 4 | 6 days supply | Qty: 21 | Fill #0

## 2016-11-13 ENCOUNTER — Other Ambulatory Visit (HOSPITAL_COMMUNITY): Payer: Self-pay | Admitting: Orthopedic Surgery

## 2016-11-13 DIAGNOSIS — M5416 Radiculopathy, lumbar region: Secondary | ICD-10-CM

## 2016-11-13 DIAGNOSIS — Z9889 Other specified postprocedural states: Secondary | ICD-10-CM

## 2016-11-13 MED FILL — OXYCODONE W/APAP 5/325 TAB: 5-325 | 5 days supply | Qty: 60 | Fill #0

## 2016-11-13 MED FILL — CEPHALEXIN 500 MG CAPSULE: 500 | 7 days supply | Qty: 21 | Fill #0

## 2016-11-13 MED FILL — diazePAM 5 MG TABS: 5 | 15 days supply | Qty: 60 | Fill #0

## 2016-11-21 ENCOUNTER — Ambulatory Visit (HOSPITAL_COMMUNITY)
Admission: RE | Admit: 2016-11-21 | Discharge: 2016-11-21 | Disposition: A | Discharge status: Home / Self Care | Payer: PRIVATE HEALTH INSURANCE | Source: Ambulatory Visit | Attending: Orthopedic Surgery | Admitting: Orthopedic Surgery

## 2016-11-21 DIAGNOSIS — M5416 Radiculopathy, lumbar region: Secondary | ICD-10-CM | POA: Diagnosis present

## 2016-11-21 DIAGNOSIS — M5127 Other intervertebral disc displacement, lumbosacral region: Secondary | ICD-10-CM | POA: Insufficient documentation

## 2016-11-21 DIAGNOSIS — Z9889 Other specified postprocedural states: Secondary | ICD-10-CM | POA: Insufficient documentation

## 2016-11-21 DIAGNOSIS — M5187 Other intervertebral disc disorders, lumbosacral region: Secondary | ICD-10-CM | POA: Diagnosis not present

## 2016-11-21 MED ORDER — GADOBENATE DIMEGLUMINE 529 MG/ML IV SOLN
20.0000 mL | Freq: Once | INTRAVENOUS | Status: AC
  Administered 2016-11-21: 20 mL via INTRAVENOUS

## 2016-11-23 MED FILL — PROMETHAZINE 12.5 MG TABLET: 12.5 | 5 days supply | Qty: 40 | Fill #0

## 2016-11-23 MED FILL — METHOCARBAMOL 500 MG TABLET: 500 | 15 days supply | Qty: 60 | Fill #0

## 2016-11-26 MED FILL — OXYCODONE W/APAP 5/325 TAB: 5-325 | 5 days supply | Qty: 60 | Fill #0

## 2016-12-03 MED FILL — CYCLOBENZAPRINE 5 MG TABLET: 5 | 30 days supply | Qty: 60 | Fill #0

## 2016-12-07 MED FILL — OXYCODONE W/APAP 5/325 TAB: 5-325 | 5 days supply | Qty: 60 | Fill #0

## 2016-12-17 MED FILL — LYRICA 50 MG CAPSULE: 50 | 30 days supply | Qty: 60 | Fill #0

## 2016-12-17 MED FILL — METHYLPREDNISOLONE 4 MG TAB: 4 | 6 days supply | Qty: 21 | Fill #0

## 2016-12-17 MED FILL — METHOCARBAMOL 500 MG TABLET: 500 | 15 days supply | Qty: 60 | Fill #0

## 2016-12-17 MED FILL — OXYCODONE-APAP 7.5-325 MG: 7.5-325 | 5 days supply | Qty: 60 | Fill #0

## 2016-12-23 ENCOUNTER — Emergency Department (HOSPITAL_COMMUNITY)
Admission: EM | Admit: 2016-12-23 | Discharge: 2016-12-23 | Disposition: A | Discharge status: Home / Self Care | Payer: 59 | Attending: Emergency Medicine | Admitting: Emergency Medicine

## 2016-12-23 ENCOUNTER — Encounter (HOSPITAL_COMMUNITY): Payer: Self-pay

## 2016-12-23 ENCOUNTER — Emergency Department (HOSPITAL_COMMUNITY): Payer: 59

## 2016-12-23 DIAGNOSIS — R519 Headache, unspecified: Secondary | ICD-10-CM

## 2016-12-23 DIAGNOSIS — I10 Essential (primary) hypertension: Secondary | ICD-10-CM | POA: Diagnosis not present

## 2016-12-23 DIAGNOSIS — R51 Headache: Secondary | ICD-10-CM | POA: Insufficient documentation

## 2016-12-23 LAB — BASIC METABOLIC PANEL
ANION GAP: 9 (ref 5–15)
BUN: 8 mg/dL (ref 6–20)
CHLORIDE: 101 mmol/L (ref 101–111)
CO2: 24 mmol/L (ref 22–32)
Calcium: 9.6 mg/dL (ref 8.9–10.3)
Creatinine, Ser: 0.62 mg/dL (ref 0.44–1.00)
GFR calc Af Amer: >60 mL/min (ref >60)
Glucose, Bld: 106 mg/dL — ABNORMAL HIGH (ref 65–99)
POTASSIUM: 3.8 mmol/L (ref 3.5–5.1)
SODIUM: 134 mmol/L — AB (ref 135–145)

## 2016-12-23 LAB — CBC WITH DIFFERENTIAL/PLATELET
BASOS ABS: 0 10*3/uL (ref 0.0–0.1)
Basophils Relative: 0 %
EOS ABS: 0.5 10*3/uL (ref 0.0–0.7)
EOS PCT: 4 %
HCT: 39.5 % (ref 36.0–46.0)
HEMOGLOBIN: 13.5 g/dL (ref 12.0–15.0)
LYMPHS ABS: 4.8 10*3/uL — AB (ref 0.7–4.0)
LYMPHS PCT: 37 %
MCH: 30.5 pg (ref 26.0–34.0)
MCHC: 34.2 g/dL (ref 30.0–36.0)
MCV: 89.2 fL (ref 78.0–100.0)
Monocytes Absolute: 0.8 10*3/uL (ref 0.1–1.0)
Monocytes Relative: 6 %
NEUTROS PCT: 53 %
Neutro Abs: 6.9 10*3/uL (ref 1.7–7.7)
PLATELETS: 353 10*3/uL (ref 150–400)
RBC: 4.43 MIL/uL (ref 3.87–5.11)
RDW: 12.6 % (ref 11.5–15.5)
WBC: 13 10*3/uL — AB (ref 4.0–10.5)

## 2016-12-23 MED ORDER — SODIUM CHLORIDE 0.9 % IV BOLUS (SEPSIS)
1000.0000 mL | Freq: Once | INTRAVENOUS | Status: AC
  Administered 2016-12-23: 1000 mL via INTRAVENOUS

## 2016-12-23 MED ORDER — METOCLOPRAMIDE HCL 5 MG/ML IJ SOLN
10.0000 mg | Freq: Once | INTRAMUSCULAR | Status: AC
  Administered 2016-12-23: 10 mg via INTRAVENOUS
  Filled 2016-12-23: qty 2

## 2016-12-23 MED ORDER — KETOROLAC TROMETHAMINE 30 MG/ML IJ SOLN
30.0000 mg | Freq: Once | INTRAMUSCULAR | Status: AC
  Administered 2016-12-23: 30 mg via INTRAVENOUS
  Filled 2016-12-23: qty 1

## 2016-12-23 MED ORDER — DIPHENHYDRAMINE HCL 50 MG/ML IJ SOLN
25.0000 mg | Freq: Once | INTRAMUSCULAR | Status: AC
  Administered 2016-12-23: 25 mg via INTRAVENOUS
  Filled 2016-12-23: qty 1

## 2016-12-23 NOTE — ED Triage Notes (Signed)
Patient states that she recently had spine surgery and developed headache, blurred vision and took her BP with elevated readings. States that all symptoms started yesterday. On arrival alert and oriented, no neuro deficits, NAD. No slurred speech.

## 2016-12-23 NOTE — ED Provider Notes (Signed)
[facility] DEPT Provider Note   CSN: [id number] Arrival date & time: 12/23/16  1645  By signing my name below, I, Hansel Feinstein, attest that this documentation has been prepared under the direction and in the presence of Malvin Johns, MD. Electronically Signed: Hansel Feinstein, ED Scribe. 12/23/16. 6:17 PM.     History   Chief Complaint Chief Complaint  Patient presents with  . HTN, blurred vision, headache    HPI Kelly Buck is a 36 y.o. female s/p L5-S1 microdiscectomy 10/31/16 by Dr. Lynann Bologna who presents to the Emergency Department complaining of worsening posterior and frontal HA x 2 days with associated constant right eye blurred vision since yesterday. Pt also complains of nausea. Per pt, she checked her BP yesterday after noticing "a vessel in my right eye busted" and found it to be 135/113 at the highest and 128/96 at the lowest. Pt states her HA is unaffected in all positions and is not worsened with movement. She tried applying a cool rag to her head with no relief. She also notes her prescription pain medication for her back does not alleviate her HA. She also notes she received a spinal injection 2 weeks ago and had HA and right leg numbness for days after the injection. SO notes the pt appears to be speaking slightly slower since the injection, but they deny slurred speech or difficulty speaking. She denies facial numbness, weakness to BUE or LLE, new numbness to BLE, fever, neck pain, vomiting.   The history is provided by the patient. No language interpreter was used.    Past Medical History:  Diagnosis Date  . Anxiety   . Complication of anesthesia   . Palpitations   . PONV (postoperative nausea and vomiting)   . Vaginal Pap smear, abnormal     Patient Active Problem List   Diagnosis Date Noted  . Postpartum endometritis 06/04/2014  . Retained products of conception after delivery without hemorrhage 06/01/2014  . Postpartum state 05/21/2014  . Oligohydramnios  05/20/2014  . Antepartum non-reassuring fetal heart rate or rhythm affecting care of mother 04/28/2014    Past Surgical History:  Procedure Laterality Date  . CARPAL TUNNEL RELEASE Right   . CERVICAL BIOPSY    . DILATION AND EVACUATION N/A 06/01/2014   Procedure: DILATATION AND EVACUATION;  Surgeon: [name], MD;  Location: [facility];  Service: Gynecology;  Laterality: N/A;  . DILATION AND EVACUATION N/A 06/04/2014   Procedure: DILATATION AND EVACUATION;  Surgeon: [name], MD;  Location: [facility];  Service: Gynecology;  Laterality: N/A;  . LUMBAR LAMINECTOMY/DECOMPRESSION MICRODISCECTOMY Right 10/31/2016   Procedure: RIGHT SIDED LUMBAR 5-SACRUM 1 MICRODISCECTOMY;  Surgeon: Phylliss Bob, MD;  Location: [facility];  Service: Orthopedics;  Laterality: Right;  RIGHT SIDED LUMBAR 5-SACRUM 1 MICRODISCECTOMY  . PLACEMENT OF BREAST IMPLANTS    . tummy tuck      OB History    Gravida Para Term Preterm AB Living   5 4 4   1 4    SAB TAB Ectopic Multiple Live Births   1       1       Home Medications    Prior to Admission medications   Not on File    Family History No family history on file.  Social History Social History  Substance Use Topics  . Smoking status: Never Smoker  . Smokeless tobacco: Never Used     Comment: tried as a teenager- no a habit  . Alcohol use No  Allergies   No known allergies   Review of Systems Review of Systems  Constitutional: Negative for chills, diaphoresis, fatigue and fever.  HENT: Negative for congestion, rhinorrhea and sneezing.   Eyes: Positive for visual disturbance.  Respiratory: Negative for cough, chest tightness and shortness of breath.   Cardiovascular: Negative for chest pain and leg swelling.  Gastrointestinal: Positive for nausea. Negative for abdominal pain, blood in stool, diarrhea and vomiting.  Genitourinary: Negative for difficulty urinating, flank pain, frequency and hematuria.  Musculoskeletal: Positive for  back pain (s/p recent surgery). Negative for arthralgias and neck pain.  Skin: Negative for rash.  Neurological: Positive for numbness (RLE, not worsened since surgery) and headaches. Negative for dizziness, speech difficulty and weakness.     Physical Exam Updated Vital Signs BP 147/83   Pulse 92   Temp 98.8 F (37.1 C)   Resp 18   SpO2 100%   Physical Exam  Constitutional: She is oriented to person, place, and time. She appears well-developed and well-nourished.  HENT:  Head: Normocephalic and atraumatic.  Eyes: Pupils are equal, round, and reactive to light.  Neck: Normal range of motion. Neck supple.  No meningismus.   Cardiovascular: Normal rate, regular rhythm and normal heart sounds.   Pulmonary/Chest: Effort normal and breath sounds normal. No respiratory distress. She has no wheezes. She has no rales. She exhibits no tenderness.  Abdominal: Soft. Bowel sounds are normal. There is no tenderness. There is no rebound and no guarding.  Musculoskeletal: Normal range of motion. She exhibits no edema.  Lymphadenopathy:    She has no cervical adenopathy.  Neurological: She is alert and oriented to person, place, and time. No cranial nerve deficit or sensory deficit. Coordination normal.  Motor 5/5 all extremities Sensation grossly intact to LT all extremities Finger to Nose intact, no pronator drift CN II-XII grossly intact  Skin: Skin is warm and dry. No rash noted.  Psychiatric: She has a normal mood and affect.  Nursing note and vitals reviewed.    ED Treatments / Results   DIAGNOSTIC STUDIES: Oxygen Saturation is 100% on RA, normal by my interpretation.    COORDINATION OF CARE: 6:14 PM Discussed treatment plan with pt at bedside which includes lab work, CT head and pt agreed to plan.    Labs (all labs ordered are listed, but only abnormal results are displayed) Labs Reviewed  CBC WITH DIFFERENTIAL/PLATELET - Abnormal; Notable for the following:       Result  Value   WBC 13.0 (*)    Lymphs Abs 4.8 (*)    All other components within normal limits  BASIC METABOLIC PANEL - Abnormal; Notable for the following:    Sodium 134 (*)    Glucose, Bld 106 (*)    All other components within normal limits    EKG  EKG Interpretation None       Radiology Ct Head Wo Contrast  Result Date: 12/23/2016 CLINICAL DATA:  Migraines. EXAM: CT HEAD WITHOUT CONTRAST TECHNIQUE: Contiguous axial images were obtained from the base of the skull through the vertex without intravenous contrast. COMPARISON:  July 29, 2014 FINDINGS: Brain: No evidence of acute infarction, hemorrhage, hydrocephalus, extra-axial collection or mass lesion/mass effect. Vascular: No hyperdense vessel or unexpected calcification. Skull: Normal. Negative for fracture or focal lesion. Sinuses/Orbits: No acute finding. Other: None. IMPRESSION: No acute abnormalities identified.  No cause for headache is noted. Electronically Signed   By: Dorise Bullion III M.D   On: 12/23/2016 18:57  Procedures Procedures (including critical care time)  Medications Ordered in ED Medications  metoCLOPramide (REGLAN) injection 10 mg (10 mg Intravenous Given 12/23/16 1853)  diphenhydrAMINE (BENADRYL) injection 25 mg (25 mg Intravenous Given 12/23/16 1853)  ketorolac (TORADOL) 30 MG/ML injection 30 mg (30 mg Intravenous Given 12/23/16 1853)  sodium chloride 0.9 % bolus 1,000 mL (0 mLs Intravenous Stopped 12/23/16 2046)     Initial Impression / Assessment and Plan / ED Course  I have reviewed the triage vital signs and the nursing notes.  Pertinent labs & imaging results that were available during my care of the patient were reviewed by me and considered in my medical decision making (see chart for details).     Patient presents with a headache that started after she had an epidural steroid injection. She's neurologically intact. She reports some blurred vision in her right eye that she has no associated facial  numbness, ptosis, neck pain or other meningeal symptoms. Her headache doesn't sound a post spinal headache. She's afebrile. Her head CT is negative for mass or intracranial hemorrhage. She doesn't have other symptoms that would be more concerning for stroke. She was given a migraine cocktail and is feeling much better. She still has reported blurry vision but her visual acuity is 20/40 in the right eye.she doesn't have any eye pain or erythema. She has symptoms that are consistent with a subconjunctival hemorrhage yesterday but none is visible on exam today. She was discharged home in good condition.she was encouraged to have close follow-up with her PCP. Return precautions were given.  Final Clinical Impressions(s) / ED Diagnoses   Final diagnoses:  Acute nonintractable headache, unspecified headache type    New Prescriptions New Prescriptions   No medications on file    I personally performed the services described in this documentation, which was scribed in my presence.  The recorded information has been reviewed and considered.    Malvin Johns, MD 12/23/16 [phone]

## 2016-12-31 MED FILL — PROMETHAZINE 12.5 MG TABLET: 12.5 | 5 days supply | Qty: 40 | Fill #0

## 2016-12-31 MED FILL — METHOCARBAMOL 500 MG TABLET: 500 | 15 days supply | Qty: 60 | Fill #0

## 2016-12-31 MED FILL — OXYCODONE-APAP 7.5-325 MG: 7.5-325 | 6 days supply | Qty: 60 | Fill #0

## 2017-01-03 MED FILL — GABAPENTIN 300 MG CAPSULE: 300 | 24 days supply | Qty: 120 | Fill #0

## 2017-01-04 MED FILL — PROMETHAZINE 12.5 MG TABLET: 12.5 | 5 days supply | Qty: 40 | Fill #1

## 2017-01-07 DIAGNOSIS — M519 Unspecified thoracic, thoracolumbar and lumbosacral intervertebral disc disorder: Secondary | ICD-10-CM | POA: Diagnosis not present

## 2017-01-07 DIAGNOSIS — R51 Headache: Secondary | ICD-10-CM | POA: Diagnosis not present

## 2017-01-07 DIAGNOSIS — F419 Anxiety disorder, unspecified: Secondary | ICD-10-CM | POA: Diagnosis not present

## 2017-01-07 DIAGNOSIS — F329 Major depressive disorder, single episode, unspecified: Secondary | ICD-10-CM | POA: Diagnosis not present

## 2017-01-07 DIAGNOSIS — L989 Disorder of the skin and subcutaneous tissue, unspecified: Secondary | ICD-10-CM | POA: Diagnosis not present

## 2017-01-11 MED FILL — OXYCODONE-APAP 7.5-325 MG: 7.5-325 | 6 days supply | Qty: 60 | Fill #0

## 2017-01-23 MED FILL — METHOCARBAMOL 500 MG TABLET: 500 | 15 days supply | Qty: 60 | Fill #0

## 2017-01-24 MED FILL — OXYCODONE-APAP 7.5-325 MG: 7.5-325 | 5 days supply | Qty: 60 | Fill #0

## 2017-01-29 ENCOUNTER — Other Ambulatory Visit: Payer: Self-pay | Admitting: Dermatology

## 2017-01-29 DIAGNOSIS — B079 Viral wart, unspecified: Secondary | ICD-10-CM | POA: Diagnosis not present

## 2017-01-29 DIAGNOSIS — C44319 Basal cell carcinoma of skin of other parts of face: Secondary | ICD-10-CM | POA: Diagnosis not present

## 2017-01-29 DIAGNOSIS — L219 Seborrheic dermatitis, unspecified: Secondary | ICD-10-CM | POA: Diagnosis not present

## 2017-01-29 DIAGNOSIS — D485 Neoplasm of uncertain behavior of skin: Secondary | ICD-10-CM | POA: Diagnosis not present

## 2017-01-29 DIAGNOSIS — B078 Other viral warts: Secondary | ICD-10-CM | POA: Diagnosis not present

## 2017-02-05 MED FILL — PARoxetine HCL 20 MG TABS: 20 | 30 days supply | Qty: 30 | Fill #0

## 2017-02-08 MED FILL — GABAPENTIN 300 MG CAPSULE: 300 | 20 days supply | Qty: 120 | Fill #0

## 2017-02-08 MED FILL — METHOCARBAMOL 500 MG TABLET: 500 | 15 days supply | Qty: 60 | Fill #0

## 2017-02-08 MED FILL — OXYCODONE-APAP 7.5-325 MG: 7.5-325 | 5 days supply | Qty: 60 | Fill #0

## 2017-02-27 MED FILL — OXYCODONE-APAP 7.5-325 MG: 7.5-325 | 5 days supply | Qty: 60 | Fill #0

## 2017-03-04 MED FILL — GABAPENTIN 300 MG CAPSULE: 300 | 20 days supply | Qty: 120 | Fill #0

## 2017-03-08 MED FILL — METHOCARBAMOL 500 MG TABLET: 500 | 15 days supply | Qty: 60 | Fill #0

## 2017-03-15 MED FILL — OXYCODONE-APAP 7.5-325 MG: 7.5-325 | 7 days supply | Qty: 60 | Fill #0

## 2017-03-22 MED FILL — DULoxetine HCL 30 MG CPEP: 30 | 30 days supply | Qty: 30 | Fill #0

## 2017-03-27 MED FILL — PARoxetine HCL 40 MG TABS: 40 | 90 days supply | Qty: 90 | Fill #0

## 2017-03-29 MED FILL — METHOCARBAMOL 500 MG TABLET: 500 | 15 days supply | Qty: 60 | Fill #0

## 2017-04-01 MED FILL — GABAPENTIN 300 MG CAPSULE: 300 | 20 days supply | Qty: 120 | Fill #0

## 2017-04-01 MED FILL — OXYCODONE-APAP 7.5-325 MG: 7.5-325 | 5 days supply | Qty: 60 | Fill #0

## 2017-04-29 MED FILL — DULoxetine HCL 30 MG CPEP: 30 | 30 days supply | Qty: 30 | Fill #1

## 2017-04-29 MED FILL — GABAPENTIN 300 MG CAPSULE: 300 | 20 days supply | Qty: 120 | Fill #0

## 2017-04-29 MED FILL — METHOCARBAMOL 500 MG TABLET: 500 | 15 days supply | Qty: 60 | Fill #0

## 2017-05-02 MED FILL — OXYCODONE-APAP 7.5-325 MG: 7.5-325 | 5 days supply | Qty: 60 | Fill #0

## 2017-06-03 ENCOUNTER — Encounter (HOSPITAL_COMMUNITY): Payer: Self-pay

## 2017-06-03 ENCOUNTER — Emergency Department (HOSPITAL_COMMUNITY)
Admission: EM | Admit: 2017-06-03 | Discharge: 2017-06-03 | Discharge status: Against Medical Advice | Payer: 59 | Attending: Emergency Medicine | Admitting: Emergency Medicine

## 2017-06-03 DIAGNOSIS — Z5321 Procedure and treatment not carried out due to patient leaving prior to being seen by health care provider: Secondary | ICD-10-CM | POA: Insufficient documentation

## 2017-06-03 DIAGNOSIS — M549 Dorsalgia, unspecified: Secondary | ICD-10-CM | POA: Insufficient documentation

## 2017-06-03 NOTE — ED Triage Notes (Signed)
Pt reports she had spine surgery 10/2016 and has been going to PT since. On Friday when leaving PT she had sudden onset of sharp tingling pain to left lower back and leg. Pt sates she has been using heat and ice along with her walking and has gotten no relief since Friday.

## 2017-06-05 ENCOUNTER — Other Ambulatory Visit: Payer: Self-pay | Admitting: Physical Medicine and Rehabilitation

## 2017-06-05 DIAGNOSIS — R200 Anesthesia of skin: Secondary | ICD-10-CM

## 2017-06-07 MED FILL — METHYLPREDNISOLONE 4 MG TAB: 4 | 6 days supply | Qty: 21 | Fill #0

## 2017-06-08 ENCOUNTER — Ambulatory Visit
Admission: RE | Admit: 2017-06-08 | Discharge: 2017-06-08 | Disposition: A | Discharge status: Home / Self Care | Payer: PRIVATE HEALTH INSURANCE | Source: Ambulatory Visit | Attending: Physical Medicine and Rehabilitation | Admitting: Physical Medicine and Rehabilitation

## 2017-06-08 DIAGNOSIS — R200 Anesthesia of skin: Secondary | ICD-10-CM

## 2017-06-08 DIAGNOSIS — G9619 Other disorders of meninges, not elsewhere classified: Secondary | ICD-10-CM | POA: Diagnosis not present

## 2017-06-08 DIAGNOSIS — M5126 Other intervertebral disc displacement, lumbar region: Secondary | ICD-10-CM | POA: Diagnosis not present

## 2017-06-08 DIAGNOSIS — R600 Localized edema: Secondary | ICD-10-CM | POA: Insufficient documentation

## 2017-06-08 DIAGNOSIS — M47896 Other spondylosis, lumbar region: Secondary | ICD-10-CM | POA: Diagnosis not present

## 2017-06-08 DIAGNOSIS — M545 Low back pain: Secondary | ICD-10-CM | POA: Diagnosis present

## 2017-06-08 MED ORDER — GADOBENATE DIMEGLUMINE 529 MG/ML IV SOLN
20.0000 mL | Freq: Once | INTRAVENOUS | Status: AC | PRN
  Administered 2017-06-08: 20 mL via INTRAVENOUS

## 2017-06-21 MED FILL — GABAPENTIN 300 MG CAPSULE: 300 | 30 days supply | Qty: 90 | Fill #0

## 2017-06-22 ENCOUNTER — Ambulatory Visit (HOSPITAL_COMMUNITY)
Admission: EM | Admit: 2017-06-22 | Discharge: 2017-06-22 | Disposition: A | Discharge status: Home / Self Care | Payer: PRIVATE HEALTH INSURANCE | Attending: Physical Medicine and Rehabilitation | Admitting: Physical Medicine and Rehabilitation

## 2017-06-22 DIAGNOSIS — M545 Low back pain: Secondary | ICD-10-CM | POA: Insufficient documentation

## 2017-06-22 LAB — CBC WITH DIFFERENTIAL/PLATELET
Basophils Absolute: 0 10*3/uL (ref 0.0–0.1)
Basophils Relative: 0 %
Eosinophils Absolute: 0.3 10*3/uL (ref 0.0–0.7)
Eosinophils Relative: 3 %
HEMATOCRIT: 41 % (ref 36.0–46.0)
HEMOGLOBIN: 13.8 g/dL (ref 12.0–15.0)
LYMPHS ABS: 4.3 10*3/uL — AB (ref 0.7–4.0)
LYMPHS PCT: 37 %
MCH: 29.9 pg (ref 26.0–34.0)
MCHC: 33.7 g/dL (ref 30.0–36.0)
MCV: 88.9 fL (ref 78.0–100.0)
MONOS PCT: 5 %
Monocytes Absolute: 0.6 10*3/uL (ref 0.1–1.0)
NEUTROS ABS: 6.4 10*3/uL (ref 1.7–7.7)
NEUTROS PCT: 55 %
Platelets: 344 10*3/uL (ref 150–400)
RBC: 4.61 MIL/uL (ref 3.87–5.11)
RDW: 12.6 % (ref 11.5–15.5)
WBC: 11.5 10*3/uL — ABNORMAL HIGH (ref 4.0–10.5)

## 2017-06-22 LAB — SEDIMENTATION RATE: Sed Rate: 10 mm/hr (ref 0–22)

## 2017-06-22 LAB — C-REACTIVE PROTEIN: CRP: 2.1 mg/dL — ABNORMAL HIGH (ref <1.0)

## 2017-07-25 ENCOUNTER — Encounter: Payer: Self-pay | Admitting: Internal Medicine

## 2017-07-25 ENCOUNTER — Ambulatory Visit (INDEPENDENT_AMBULATORY_CARE_PROVIDER_SITE_OTHER): Payer: PRIVATE HEALTH INSURANCE | Admitting: Internal Medicine

## 2017-07-25 ENCOUNTER — Telehealth: Payer: Self-pay | Admitting: *Deleted

## 2017-07-25 DIAGNOSIS — M5137 Other intervertebral disc degeneration, lumbosacral region: Secondary | ICD-10-CM | POA: Insufficient documentation

## 2017-07-25 DIAGNOSIS — M5136 Other intervertebral disc degeneration, lumbar region: Secondary | ICD-10-CM | POA: Diagnosis not present

## 2017-07-25 NOTE — Progress Notes (Signed)
[facility] for Infectious Disease      Reason for Consult: ? discitis    Referring Physician: Dr. Madelon Lips    Patient ID: Kelly Buck, female    DOB: August 19, 1981, 36 y.o.   MRN: [id number]  HPI:   She is here for evaluation for potential discitis/osteomyelitis of the lumbar region.  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  The initial injury was in October 2017 and she reports it was the result of a faulty wheelchair she was pushing with a patient in the chair and had an abrupt stop causing her to injure herself with a bulging disc with MRI revealing right L5/S1 herniated nucleus pulposis on 10/11/2016.  She underwent conservative non-surgical treatment under the care of Dr. Lynann Bologna and in January 2018 underwent "right-sided L5-S1 laminotomy with partial facetectomy and removal of broad-based protruded L5-S1 disk herniation".  She had continued pain in her back after that and follow up MRI on 11/21/2106 noted surgical changes and small right L5-S1 disc protrusion.  She also reports that the surgical site has opened with serosanguinous drainage that has happened multiple times.  She reports when it opens, it does turn red, then eventually closes.  She reports a low-grade fever but has not taken her temperature.  She reports sweating a lot.  On 06/08/17 she underwent an MRI again and report of L5-S1 with non-compressive bulging, edema and enhancement of L5 and S1 likely to represent degenerative changes but cannot be differentiated from early discitis osteomyelitis.   MRI independently reviewed on edema and enhancement noted in the area and confirmed.  Previous record reviewed including a CRP of 2.1 and ESR of 10 from 06/22/17.  MRI reports reviewed as above.    Past Medical History:  Diagnosis Date  . Anxiety   . Complication of anesthesia   . Palpitations   . PONV (postoperative nausea and vomiting)   . Vaginal Pap smear, abnormal     Prior to  Admission medications   Medication Sig Start Date End Date Taking? Authorizing Provider  acetaminophen (TYLENOL) 325 MG suppository Place rectally.   Yes [provider]  gabapentin (NEURONTIN) 300 MG capsule TAKE 1 CAPSULE BY MOUTH 3 TIMES PER DAY 06/21/17  Yes [provider]  ibuprofen (ADVIL,MOTRIN) 200 MG tablet Take by mouth.   Yes [provider]  methocarbamol (ROBAXIN) 500 MG tablet TAKE 1 TABLET BY MOUTH EVERY 6 HOURS AS NEEDED FOR SPASMS 04/29/17  Yes [provider]  PARoxetine (PAXIL) 40 MG tablet TAKE 1 TABLET BY MOUTH IN THE MORNING ONCE A DAY 03/27/17  Yes [provider]  methylPREDNISolone (MEDROL DOSEPAK) 4 MG TBPK tablet follow package directions 06/04/17   [provider]  oxyCODONE-acetaminophen (PERCOCET/ROXICET) 5-325 MG tablet  06/07/17   [provider]    Allergies  Allergen Reactions  . No Known Allergies     Social History  Substance Use Topics  . Smoking status: Never Smoker  . Smokeless tobacco: Never Used     Comment: tried as a teenager- no a habit  . Alcohol use No    FMH: no known disc disease  Review of Systems  Constitutional: negative for chills and anorexia Gastrointestinal: negative for diarrhea Integument/breast: negative for rash All other systems reviewed and are negative    Constitutional: in no apparent distress and alert  Vitals:   07/25/17 0908  BP: 126/88  Pulse: (!) 105  Temp: 99.2 F (  37.3 C)   EYES: anicteric ENMT: no thrush Cardiovascular: Cor RRR Respiratory: CTA B; normal respiratory effort GI: soft Musculoskeletal: no pedal edema noted Skin: negatives: no rash Back: surgical area with darkened area, no fluctuance, no erythema, no warmth, no drainage.    Labs: Lab Results  Component Value Date   WBC 11.5 (H) 06/22/2017   HGB 13.8 06/22/2017   HCT 41.0 06/22/2017   MCV 88.9 06/22/2017   PLT 344 06/22/2017    Lab Results  Component Value Date    CREATININE 0.62 12/23/2016   BUN 8 12/23/2016   NA 134 (L) 12/23/2016   K 3.8 12/23/2016   CL 101 12/23/2016   CO2 24 12/23/2016    Lab Results  Component Value Date   ALT 31 10/31/2016   AST 35 10/31/2016   ALKPHOS 34 (L) 10/31/2016   BILITOT 1.0 10/31/2016   INR 0.97 10/31/2016     Assessment: Bulging disc, back pain. I reviewed the results with Kelly Buck and area of concern.  In my assessment, this is not consistent with infection.  The MRI findings are not specific to infection and has been ongoing for over 6 months with a normal ESR of 10 and minimally elevated CRP and no pus drainage suggesting this is non-infectious so no indication for bone biopsy or antibiotics.    Plan: 1) no intervention from infectious disease standpoint indicated

## 2017-07-25 NOTE — Telephone Encounter (Signed)
Record release from workers comp received and placed upfront for scan. Office note faxed to [facility], [place] RN [phone] and confirmation received. Kelly Buck

## 2017-08-15 MED FILL — PARoxetine HCL 40 MG TABS: 40 | 30 days supply | Qty: 30 | Fill #0

## 2017-08-27 ENCOUNTER — Other Ambulatory Visit (HOSPITAL_COMMUNITY): Payer: Self-pay | Admitting: Orthopaedic Surgery of the Spine

## 2017-08-27 DIAGNOSIS — Z9889 Other specified postprocedural states: Secondary | ICD-10-CM

## 2017-08-31 ENCOUNTER — Ambulatory Visit (HOSPITAL_COMMUNITY)
Admission: RE | Admit: 2017-08-31 | Discharge: 2017-08-31 | Disposition: A | Discharge status: Home / Self Care | Payer: PRIVATE HEALTH INSURANCE | Source: Ambulatory Visit | Attending: Orthopaedic Surgery of the Spine | Admitting: Orthopaedic Surgery of the Spine

## 2017-08-31 DIAGNOSIS — Z9889 Other specified postprocedural states: Secondary | ICD-10-CM | POA: Diagnosis present

## 2017-08-31 DIAGNOSIS — M4807 Spinal stenosis, lumbosacral region: Secondary | ICD-10-CM | POA: Insufficient documentation

## 2017-08-31 DIAGNOSIS — M47896 Other spondylosis, lumbar region: Secondary | ICD-10-CM | POA: Diagnosis not present

## 2017-08-31 MED ORDER — GADOBENATE DIMEGLUMINE 529 MG/ML IV SOLN
20.0000 mL | Freq: Once | INTRAVENOUS | Status: AC | PRN
  Administered 2017-08-31: 20 mL via INTRAVENOUS

## 2017-09-09 DIAGNOSIS — Z9889 Other specified postprocedural states: Secondary | ICD-10-CM | POA: Diagnosis not present

## 2017-09-09 DIAGNOSIS — Z09 Encounter for follow-up examination after completed treatment for conditions other than malignant neoplasm: Secondary | ICD-10-CM | POA: Diagnosis not present

## 2017-09-24 NOTE — Progress Notes (Deleted)
   Subjective:    Patient ID: Kelly Buck, female    DOB: 1981-02-29, 36 y.o.   MRN: [id number]  HPI :  Kelly Buck is here to establish as a new pt.  She is a pleasant 36 year old female.  PMH: Sig musculoskeletal  Depression   Review of Systems     Objective:   Physical Exam        Assessment & Plan:

## 2017-09-30 ENCOUNTER — Ambulatory Visit: Payer: Self-pay | Admitting: Adult Health

## 2017-10-07 NOTE — Progress Notes (Signed)
   Subjective:    Patient ID: Kelly Buck, female    DOB: 1981/05/26, 36 y.o.   MRN: [id number]  HPI:  Kelly Buck is here to establish as a new pt.  She is a pleasant 36 year old female.  PMH: Chronic back pain r/t Workers  Appt Aborted- no charge applied     Review of Systems     Objective:   Physical Exam        Assessment & Plan:

## 2017-10-09 ENCOUNTER — Encounter: Payer: Self-pay | Admitting: Adult Health

## 2017-10-09 ENCOUNTER — Ambulatory Visit (INDEPENDENT_AMBULATORY_CARE_PROVIDER_SITE_OTHER): Payer: BLUE CROSS/BLUE SHIELD | Admitting: Adult Health

## 2017-10-09 VITALS — BP 127/86 | HR 78 | Ht 65.0 in | Wt 246.9 lb

## 2017-10-09 DIAGNOSIS — Z23 Encounter for immunization: Secondary | ICD-10-CM

## 2017-10-09 DIAGNOSIS — M5136 Other intervertebral disc degeneration, lumbar region: Secondary | ICD-10-CM

## 2017-11-06 DIAGNOSIS — M79642 Pain in left hand: Secondary | ICD-10-CM | POA: Diagnosis not present

## 2017-11-06 DIAGNOSIS — M542 Cervicalgia: Secondary | ICD-10-CM | POA: Diagnosis not present

## 2017-11-06 DIAGNOSIS — G5602 Carpal tunnel syndrome, left upper limb: Secondary | ICD-10-CM | POA: Diagnosis not present

## 2017-11-06 DIAGNOSIS — M79641 Pain in right hand: Secondary | ICD-10-CM | POA: Diagnosis not present

## 2017-11-15 MED FILL — TROKENDI XR 200 MG CAPSULE: 200 | 30 days supply | Qty: 60 | Fill #0

## 2017-12-10 DIAGNOSIS — R05 Cough: Secondary | ICD-10-CM | POA: Diagnosis not present

## 2017-12-10 DIAGNOSIS — J069 Acute upper respiratory infection, unspecified: Secondary | ICD-10-CM | POA: Diagnosis not present

## 2017-12-10 DIAGNOSIS — R079 Chest pain, unspecified: Secondary | ICD-10-CM | POA: Diagnosis not present

## 2017-12-10 DIAGNOSIS — R0602 Shortness of breath: Secondary | ICD-10-CM | POA: Diagnosis not present

## 2017-12-11 ENCOUNTER — Other Ambulatory Visit: Payer: Self-pay | Admitting: Obstetrics and Gynecology

## 2017-12-11 ENCOUNTER — Other Ambulatory Visit (HOSPITAL_COMMUNITY)
Admission: RE | Admit: 2017-12-11 | Discharge: 2017-12-11 | Disposition: A | Discharge status: Home / Self Care | Payer: BLUE CROSS/BLUE SHIELD | Source: Ambulatory Visit | Attending: Obstetrics and Gynecology | Admitting: Obstetrics and Gynecology

## 2017-12-11 DIAGNOSIS — Z124 Encounter for screening for malignant neoplasm of cervix: Secondary | ICD-10-CM | POA: Insufficient documentation

## 2017-12-11 DIAGNOSIS — Z01419 Encounter for gynecological examination (general) (routine) without abnormal findings: Secondary | ICD-10-CM | POA: Diagnosis not present

## 2017-12-13 MED FILL — TROKENDI XR 200 MG CAPSULE: 200 | 30 days supply | Qty: 60 | Fill #1

## 2017-12-16 LAB — CYTOLOGY - PAP
Diagnosis: NEGATIVE
HPV (WINDOPATH): NOT DETECTED

## 2017-12-18 MED FILL — diazePAM 5 MG TABS: 5 | 1 days supply | Qty: 4 | Fill #0

## 2018-01-13 DIAGNOSIS — Z30430 Encounter for insertion of intrauterine contraceptive device: Secondary | ICD-10-CM | POA: Diagnosis not present

## 2018-01-24 MED FILL — TROKENDI XR 200 MG CAPSULE: 200 | 30 days supply | Qty: 60 | Fill #2

## 2018-02-11 MED FILL — OXTELLAR XR 600 MG TABLET: 600 | 30 days supply | Qty: 60 | Fill #0

## 2018-02-19 MED FILL — DOXEPIN 25 MG CAPSULE: 25 | 30 days supply | Qty: 60 | Fill #0

## 2018-03-06 MED FILL — TOPIRAMATE 200 MG TABLET: 200 | 30 days supply | Qty: 60 | Fill #0

## 2018-03-25 MED FILL — IMIPRAMINE HCL 25 MG TABLET: 25 | 30 days supply | Qty: 30 | Fill #0

## 2018-04-14 MED FILL — IMIPRAMINE HCL 25 MG TABLET: 25 | 30 days supply | Qty: 90 | Fill #0

## 2018-05-13 MED FILL — IMIPRAMINE HCL 25 MG TABLET: 25 | 30 days supply | Qty: 120 | Fill #0

## 2018-06-18 MED FILL — IMIPRAMINE PAMOATE 125 MG C: 125 | 30 days supply | Qty: 30 | Fill #0

## 2018-07-12 DIAGNOSIS — F330 Major depressive disorder, recurrent, mild: Secondary | ICD-10-CM | POA: Diagnosis not present

## 2018-07-18 MED FILL — IMIPRAMINE PAMOATE 125 MG C: 125 | 30 days supply | Qty: 30 | Fill #1

## 2018-08-18 MED FILL — IMIPRAMINE PAMOATE 125 MG C: 125 | 30 days supply | Qty: 30 | Fill #2

## 2018-08-22 ENCOUNTER — Inpatient Hospital Stay (HOSPITAL_COMMUNITY): Payer: BLUE CROSS/BLUE SHIELD

## 2018-08-22 ENCOUNTER — Inpatient Hospital Stay (HOSPITAL_COMMUNITY)
Admission: AD | Admit: 2018-08-22 | Discharge: 2018-08-22 | Disposition: A | Discharge status: Home / Self Care | Payer: BLUE CROSS/BLUE SHIELD | Source: Ambulatory Visit | Attending: Obstetrics and Gynecology | Admitting: Obstetrics and Gynecology

## 2018-08-22 ENCOUNTER — Encounter: Payer: Self-pay | Admitting: Family Medicine

## 2018-08-22 ENCOUNTER — Ambulatory Visit (INDEPENDENT_AMBULATORY_CARE_PROVIDER_SITE_OTHER): Payer: BLUE CROSS/BLUE SHIELD | Admitting: Family Medicine

## 2018-08-22 ENCOUNTER — Other Ambulatory Visit (HOSPITAL_COMMUNITY)
Admission: RE | Admit: 2018-08-22 | Discharge: 2018-08-22 | Disposition: A | Discharge status: Home / Self Care | Payer: BLUE CROSS/BLUE SHIELD | Source: Ambulatory Visit | Attending: Family Medicine | Admitting: Family Medicine

## 2018-08-22 ENCOUNTER — Encounter (HOSPITAL_COMMUNITY): Payer: Self-pay | Admitting: *Deleted

## 2018-08-22 ENCOUNTER — Other Ambulatory Visit: Payer: Self-pay

## 2018-08-22 VITALS — BP 132/78 | HR 107 | Temp 97.6°F | Resp 17 | Ht 65.0 in | Wt 199.4 lb

## 2018-08-22 DIAGNOSIS — R102 Pelvic and perineal pain: Secondary | ICD-10-CM | POA: Diagnosis not present

## 2018-08-22 DIAGNOSIS — R1032 Left lower quadrant pain: Secondary | ICD-10-CM | POA: Insufficient documentation

## 2018-08-22 DIAGNOSIS — Z9889 Other specified postprocedural states: Secondary | ICD-10-CM | POA: Diagnosis not present

## 2018-08-22 DIAGNOSIS — N83291 Other ovarian cyst, right side: Secondary | ICD-10-CM | POA: Diagnosis not present

## 2018-08-22 DIAGNOSIS — L0291 Cutaneous abscess, unspecified: Secondary | ICD-10-CM

## 2018-08-22 DIAGNOSIS — R110 Nausea: Secondary | ICD-10-CM

## 2018-08-22 DIAGNOSIS — Z30431 Encounter for routine checking of intrauterine contraceptive device: Secondary | ICD-10-CM

## 2018-08-22 DIAGNOSIS — R109 Unspecified abdominal pain: Secondary | ICD-10-CM

## 2018-08-22 DIAGNOSIS — R21 Rash and other nonspecific skin eruption: Secondary | ICD-10-CM

## 2018-08-22 DIAGNOSIS — N926 Irregular menstruation, unspecified: Secondary | ICD-10-CM | POA: Diagnosis not present

## 2018-08-22 DIAGNOSIS — F17210 Nicotine dependence, cigarettes, uncomplicated: Secondary | ICD-10-CM | POA: Insufficient documentation

## 2018-08-22 DIAGNOSIS — Z7689 Persons encountering health services in other specified circumstances: Secondary | ICD-10-CM

## 2018-08-22 HISTORY — DX: Endometriosis, unspecified: N80.9

## 2018-08-22 LAB — URINALYSIS, ROUTINE W REFLEX MICROSCOPIC
BILIRUBIN URINE: NEGATIVE
GLUCOSE, UA: NEGATIVE mg/dL
KETONES UR: NEGATIVE mg/dL
Nitrite: NEGATIVE
PH: 7 (ref 5.0–8.0)
Protein, ur: NEGATIVE mg/dL
Specific Gravity, Urine: 1.006 (ref 1.005–1.030)

## 2018-08-22 LAB — POCT PREGNANCY, URINE: Preg Test, Ur: NEGATIVE

## 2018-08-22 MED ORDER — CEPHALEXIN 500 MG PO CAPS: 500.0000 mg | ORAL_CAPSULE | Freq: Three times a day (TID) | ORAL | 0 refills | Status: DC

## 2018-08-22 MED ORDER — ONDANSETRON 4 MG PO TBDP
4.0000 mg | ORAL_TABLET | Freq: Once | ORAL | Status: DC
  Administered 2018-08-22: 4 mg via ORAL

## 2018-08-22 NOTE — MAU Note (Signed)
Went to see family dr this morning for a couple things, was only able to see 1 string- sent over to be further evaluated since can't get into OB.. having such horrific cramping for the last few weeks, not going away.  Unable to get into OB.  Thinks it is related to IUD.  Last time she had a period, it went off, then started bleeding 5 days later. Now her cycle is about 2 wks late. Also lanced an area on her left inner ankle.  Was from a bug bite, cultures sent off, ? MRSA- area is covered with an occlusive dressing.

## 2018-08-22 NOTE — Progress Notes (Signed)
Kelly Buck, is a 37 y.o. female  [id number]  [id number]:[id number]  DOB - 05/24/1981  CC:  Chief Complaint  Patient presents with  . Establish Care       HPI: Kelly Buck is a 37 y.o. female is here today to establish care.   Kelly Buck has Antepartum non-reassuring fetal heart rate or rhythm affecting care of mother; Oligohydramnios; Postpartum state; Retained products of conception after delivery without hemorrhage; Postpartum endometritis; and Degenerative disc disease at L5-S1 level on their problem list.   IUD Problems  Kelly Buck presents today with a concern of a dislodged IUD. She doesn't routinely check her strings. IUD was placed 6 months ago. She is followed by [facility] and reports she has called them without success of getting an appointment or a call back. Reports several weeks of worsening lower pelvic pain described as cramping. She has continued to have a menstrual cycle routinely since the IUD, however has not had a recent cycle in approximately 1.5 months. Denies fever, chills, nausea, or vomiting. Denies exposure or new risk of STD .    Left ankle rash or infection Experienced an insect bite on her left lower ankle x 3 weeks ago. The area has become swollen, reddened, and tender to touch. Concern for MRSA. No prior history of abscess or MRSA.   Patient denies new headaches, chest pain, chills, fever,  nausea, new weakness , numbness or tingling, SOB, edema, or worrisome cough.   Current medications: Current Outpatient Medications:  .  naproxen sodium (ALEVE) 220 MG tablet, Take 220 mg by mouth 2 (two) times daily., Disp: , Rfl:  .  PARoxetine (PAXIL) 40 MG tablet, Take 1 tablet by mouth daily., Disp: , Rfl:    Pertinent family medical history: family history includes Healthy in her brother, brother, father, and mother.   Allergies  Allergen Reactions  . No Known Allergies     Social History   Socioeconomic History  . Marital status: Married    Spouse  name: Not on file  . Number of children: Not on file  . Years of education: Not on file  . Highest education level: Not on file  Occupational History  . Not on file  Social Needs  . Financial resource strain: Not on file  . Food insecurity:    Worry: Not on file    Inability: Not on file  . Transportation needs:    Medical: Not on file    Non-medical: Not on file  Tobacco Use  . Smoking status: Former Smoker    Packs/day: 0.25    Years: 4.00    Pack years: 1.00    Types: Cigarettes    Last attempt to quit: 10/23/1999    Years since quitting: 18.8  . Smokeless tobacco: Never Used  . Tobacco comment: tried as a teenager- no a habit  Substance and Sexual Activity  . Alcohol use: No  . Drug use: No  . Sexual activity: Not Currently  Lifestyle  . Physical activity:    Days per week: Not on file    Minutes per session: Not on file  . Stress: Not on file  Relationships  . Social connections:    Talks on phone: Not on file    Gets together: Not on file    Attends religious service: Not on file    Active member of club or organization: Not on file    Attends meetings of clubs or organizations: Not on file    Relationship status:  Not on file  . Intimate partner violence:    Fear of current or ex partner: Not on file    Emotionally abused: Not on file    Physically abused: Not on file    Forced sexual activity: Not on file  Other Topics Concern  . Not on file  Social History Narrative  . Not on file    Review of Systems: Constitutional: Negative for fever, chills, diaphoresis, activity change, appetite change and fatigue. HENT: Negative for ear pain, nosebleeds, congestion, facial swelling, rhinorrhea, neck pain, neck stiffness and ear discharge.  Eyes: Negative for pain, discharge, redness, itching and visual disturbance. Respiratory: Negative for cough, choking, chest tightness, shortness of breath, wheezing and stridor.  Cardiovascular: Negative for chest pain,  palpitations and leg swelling. Gastrointestinal: Negative for abdominal distention. Genitourinary: Negative for dysuria, urgency, frequency, hematuria, flank pain, decreased urine volume, difficulty urinating. Musculoskeletal: Negative for back pain, joint swelling, arthralgia and gait problem. Neurological: Negative for dizziness, tremors, seizures, syncope, facial asymmetry, speech difficulty, weakness, light-headedness, numbness and headaches.  Hematological: Negative for adenopathy. Does not bruise/bleed easily. Psychiatric/Behavioral: Negative for hallucinations, behavioral problems, confusion, dysphoric mood, decreased concentration and agitation.    Objective:  There were no vitals filed for this visit.  BP Readings from Last 3 Encounters:  10/09/17 127/86  07/25/17 126/88  06/03/17 121/84    There were no vitals filed for this visit.    Physical Exam: Constitutional: Patient appears well-developed and well-nourished. No distress. HENT: Normocephalic, atraumatic, External right and left ear normal. Oropharynx is clear and moist.  Eyes: Conjunctivae and EOM are normal. PERRLA, no scleral icterus. Neck: Normal ROM. Neck supple. No JVD. No tracheal deviation. No thyromegaly. CVS: RRR, S1/S2 +, no murmurs, no gallops, no carotid bruit.  Pulmonary: Effort and breath sounds normal, no stridor, rhonchi, wheezes, rales.  Abdominal: Soft. BS +, no distension, tenderness, rebound or guarding.  Genitourinary: Normal female external genitalia without lesion. No inguinal lymphadenopathy. Vaginal mucosa is pink and moist without lesions. Cervix thicken greenish malodorous discharge present. One string visible from IUD left side of cervix. No cervical motion tenderness, adnexal fullness or tenderness. Musculoskeletal: Normal range of motion. No edema and no tenderness.  Neuro: Alert. Normal muscle tone coordination. Normal gait. BUE and BLE strength 5/5.  Bilateral hand grips  symmetrical. Skin:  Procedure: Drained Wound Abscess-Verbal informed consent obtained.  Local anesthesia used 2% with epinephrine.Cleaned with alcohol swab.Prepped with betadine. Incised with # 10. Small amount of purulent exudate mixed with serosanguineous debris was extracted. Unable to be completely extract sebaceous material removed from wound bed. Psychiatric: Normal mood and affect. Behavior, judgment, thought content normal. Lab Results (prior encounters)  Lab Results  Component Value Date   WBC 11.5 (H) 06/22/2017   HGB 13.8 06/22/2017   HCT 41.0 06/22/2017   MCV 88.9 06/22/2017   PLT 344 06/22/2017   Lab Results  Component Value Date   CREATININE 0.62 12/23/2016   BUN 8 12/23/2016   NA 134 (L) 12/23/2016   K 3.8 12/23/2016   CL 101 12/23/2016   CO2 24 12/23/2016      Assessment and plan:  1. Encounter to establish care 2. IUD check up One IUD string visible. Unable to identify the source of patient 's pelvic pain. Referring back to OB/GYN or if unable to be seen, go to [facility] for further evaluation.  3. Rash -infected abscess with surrounding erythema. -I&D attempted. -treating infection with Keflex   4. Pelvic pain, deferred to  OB/GYN - Pregnancy, urine - Cervicovaginal ancillary only  5. Irregular periods/menstrual cycles - Pregnancy, urine, pending  6. Abscess I&D attempted with only minimal exudate expressed.  Culture collected. Start Keflex 500 mg TID x 10 days Return in 5 days for evaluation and possible repeat I&D.     Meds ordered this encounter  Medications  . cephALEXin (KEFLEX) 500 MG capsule    Sig: Take 1 capsule (500 mg total) by mouth 3 (three) times daily for 10 days.    Dispense:  30 capsule    Refill:  0  . ondansetron (ZOFRAN-ODT) disintegrating tablet 4 mg     Orders Placed This Encounter  Procedures  . WOUND CULTURE    Order Specific Question:   Source    Answer:   left ankle  . Pregnancy, urine     The  patient was given clear instructions to go to ER or return to medical center if symptoms don't improve, worsen or new problems develop. The patient verbalized understanding. The patient was advised  to call and obtain lab results if they haven't heard anything from out office within 7-10 business days.   A total of 40 minutes spent, greater than 50 % of this time was spent counseling and coordination of care.  Molli Barrows, FNP Primary Care at [facility] [address] [place] [address] [phone]: [phone]    This note has been created with Dragon speech recognition software and Engineer, materials. Any transcriptional errors are unintentional.

## 2018-08-22 NOTE — Discharge Instructions (Signed)
Make a follow up appointment with your doctor to review the ultrasound findings and further management.

## 2018-08-22 NOTE — Patient Instructions (Addendum)
Thank you for choosing Primary Care at [facility] to be your medical home!    Kelly Buck was seen by Molli Barrows, FNP today.   Kelly Buck's primary care provider is Scot Jun, FNP.   For the best care possible, you should try to see Molli Barrows, FNP-C whenever you come to the clinic.   We look forward to seeing you again soon!  If you have any questions about your visit today, please call us at [phone] or feel free to reach your primary care provider via [place].       Incision and Drainage, Care After Refer to this sheet in the next few weeks. These instructions provide you with information about caring for yourself after your procedure. Your health care provider may also give you more specific instructions. Your treatment has been planned according to current medical practices, but problems sometimes occur. Call your health care provider if you have any problems or questions after your procedure. What can I expect after the procedure? After the procedure, it is common to have:  Pain or discomfort around your incision site.  Drainage from your incision.  Follow these instructions at home:  Take over-the-counter and prescription medicines only as told by your health care provider.  If you were prescribed an antibiotic medicine, take it as told by your health care provider.Do not stop taking the antibiotic even if you start to feel better.  Followinstructions from your health care provider about: ? How to take care of your incision. ? When and how you should change your packing and bandage (dressing). Wash your hands with soap and water before you change your dressing. If soap and water are not available, use hand sanitizer. ? When you should remove your dressing.  Do not take baths, swim, or use a hot tub until your health care provider approves.  Keep all follow-up visits as told by your health care provider. This is important.  Check  your incision area every day for signs of infection. Check for: ? More redness, swelling, or pain. ? More fluid or blood. ? Warmth. ? Pus or a bad smell. Contact a health care provider if:  Your cyst or abscess returns.  You have a fever.  You have more redness, swelling, or pain around your incision.  You have more fluid or blood coming from your incision.  Your incision feels warm to the touch.  You have pus or a bad smell coming from your incision. Get help right away if:  You have severe pain or bleeding.  You cannot eat or drink without vomiting.  You have decreased urine output.  You become short of breath.  You have chest pain.  You cough up blood.  The area where the incision and drainage occurred becomes numb or it tingles. This information is not intended to replace advice given to you by your health care provider. Make sure you discuss any questions you have with your health care provider. Document Released: 12/31/2011 Document Revised: 03/09/2016 Document Reviewed: 07/29/2015 Elsevier Interactive Patient Education  [employer].

## 2018-08-22 NOTE — MAU Provider Note (Signed)
History     CSN: [id number]  Arrival date and time: 08/22/18 1227   First Provider Initiated Contact with Patient 08/22/18 1451      Chief Complaint  Patient presents with  . Abdominal Pain   HPI Kelly Buck 37 y.o.   Comes to MAU with LLQ pain more severe for the past 2 weeks and thinks there is a problem with her IUD.  She has had the IUD for 8 months but has had pain and cramping for the past few weeks.  Period is late for a couple of weeks.  Saw her PCP today who on pelvic exam was worried there was only one IUD string.  Has a paragard IUD as she needed nonhormonal contraception due to her other medical problems.  Reviewed the note from PCP office earlier today.  Previously she had an IUD with hormone and did not experience this much cramping.   OB History    Gravida  5   Para  4   Term  4   Preterm      AB  1   Living  4     SAB  1   TAB      Ectopic      Multiple      Live Births  4           Past Medical History:  Diagnosis Date  . Anxiety   . Complication of anesthesia   . Endometriosis   . Palpitations   . PONV (postoperative nausea and vomiting)   . Vaginal Pap smear, abnormal     Past Surgical History:  Procedure Laterality Date  . CARPAL TUNNEL RELEASE Right   . CERVICAL BIOPSY    . DILATION AND EVACUATION N/A 06/01/2014   Procedure: DILATATION AND EVACUATION;  Surgeon: [name], MD;  Location: [facility];  Service: Gynecology;  Laterality: N/A;  . DILATION AND EVACUATION N/A 06/04/2014   Procedure: DILATATION AND EVACUATION;  Surgeon: [name], MD;  Location: [facility];  Service: Gynecology;  Laterality: N/A;  . LUMBAR LAMINECTOMY/DECOMPRESSION MICRODISCECTOMY Right 10/31/2016   Procedure: RIGHT SIDED LUMBAR 5-SACRUM 1 MICRODISCECTOMY;  Surgeon: Phylliss Bob, MD;  Location: [facility];  Service: Orthopedics;  Laterality: Right;  RIGHT SIDED LUMBAR 5-SACRUM 1 MICRODISCECTOMY  . PLACEMENT OF BREAST IMPLANTS    . tummy tuck       Family History  Problem Relation Age of Onset  . Healthy Mother   . Carpal tunnel syndrome Mother   . Healthy Father   . Healthy Brother   . Healthy Brother   . Hodgkin's lymphoma Maternal Grandmother   . Lupus Paternal Grandmother   . Dementia Paternal Grandmother   . Fibromyalgia Paternal Grandmother     Social History   Tobacco Use  . Smoking status: Current Every Day Smoker    Packs/day: 0.25    Years: 4.00    Pack years: 1.00    Types: Cigarettes  . Smokeless tobacco: Never Used  Substance Use Topics  . Alcohol use: Yes    Comment: 2/wk  . Drug use: No    Allergies:  Allergies  Allergen Reactions  . No Known Allergies     Medications Prior to Admission  Medication Sig Dispense Refill Last Dose  . cephALEXin (KEFLEX) 500 MG capsule Take 1 capsule (500 mg total) by mouth 3 (three) times daily for 10 days. 30 capsule 0   . naproxen sodium (ALEVE) 220 MG tablet Take 220 mg by  mouth 2 (two) times daily.   Taking  . PARAGARD INTRAUTERINE COPPER IU by Intrauterine route.   Taking    Review of Systems  Constitutional: Negative for fever.  Gastrointestinal: Positive for abdominal pain. Negative for constipation, diarrhea, nausea and vomiting.  Genitourinary: Negative for vaginal bleeding and vaginal discharge.   Physical Exam   Blood pressure (!) 154/99, pulse (!) 108, temperature 98.4 F (36.9 C), temperature source Oral, resp. rate 18, weight 89.7 kg, last menstrual period 06/24/2018, SpO2 100 %, currently breastfeeding.  Physical Exam  Nursing note and vitals reviewed. Constitutional: She is oriented to person, place, and time. She appears well-developed and well-nourished.  HENT:  Head: Normocephalic.  Eyes: EOM are normal.  Neck: Neck supple.  Respiratory: Effort normal.  GI: Soft. There is tenderness. There is no rebound and no guarding.  Mild LLQ tenderness with palpation  Genitourinary:  Genitourinary Comments: Speculum exam: Vagina - Small  amount of white discharge, no odor Cervix - No contact bleeding, IUD strings seen - together seen as one string Bimanual exam: Cervix closed and mildly tender with palpation but no CMT Uterus mildly tender, normal size Adnexa non tender, no masses bilaterally Chaperone present for exam.   Musculoskeletal: Normal range of motion.  Right ankle has a covering on the inner ankle from cellulitis treated earlier today.  Neurological: She is alert and oriented to person, place, and time.  Skin: Skin is warm and dry.  Psychiatric: She has a normal mood and affect.    MAU Course  Procedures  Results for orders placed or performed during the hospital encounter of 08/22/18 (from the past 24 hour(s))  Urinalysis, Routine w reflex microscopic     Status: Abnormal   Collection Time: 08/22/18  1:50 PM  Result Value Ref Range   Color, Urine YELLOW YELLOW   APPearance CLEAR CLEAR   Specific Gravity, Urine 1.006 1.005 - 1.030   pH 7.0 5.0 - 8.0   Glucose, UA NEGATIVE NEGATIVE mg/dL   Hgb urine dipstick MODERATE (A) NEGATIVE   Bilirubin Urine NEGATIVE NEGATIVE   Ketones, ur NEGATIVE NEGATIVE mg/dL   Protein, ur NEGATIVE NEGATIVE mg/dL   Nitrite NEGATIVE NEGATIVE   Leukocytes, UA MODERATE (A) NEGATIVE   RBC / HPF 0-5 0 - 5 RBC/hpf   WBC, UA 0-5 0 - 5 WBC/hpf   Bacteria, UA RARE (A) NONE SEEN   Squamous Epithelial / LPF 0-5 0 - 5   Mucus PRESENT   Pregnancy, urine POC     Status: None   Collection Time: 08/22/18  1:58 PM  Result Value Ref Range   Preg Test, Ur NEGATIVE NEGATIVE    CLINICAL DATA:  Patient had an IUD placed 6 months ago. She is complaining pain and cramping for 8 weeks.  EXAM: TRANSABDOMINAL AND TRANSVAGINAL ULTRASOUND OF PELVIS  TECHNIQUE: Both transabdominal and transvaginal ultrasound examinations of the pelvis were performed. Transabdominal technique was performed for global imaging of the pelvis including uterus, ovaries, adnexal regions, and pelvic cul-de-sac. It  was necessary to proceed with endovaginal exam following the transabdominal exam to visualize the endometrium.  COMPARISON:  11/25/2015  FINDINGS: Uterus  Measurements: 9.3 x 5.1 x 6.1 cm = volume: 150 mL. No fibroids or other mass visualized.  Endometrium  Thickness: 10.6 mm. IUD seen within the endometrial canal. Possible protrusion of the left arm of the IUD into the myometrium.  Right ovary  Measurements: 4.4 x 3.3 x 3.2 cm = volume: 25 mL. Benign-appearing 3.4 cm cyst  noted.  Left ovary  Measurements: 5.6 x 2.3 x 3.0 cm = volume: 20 mL. Normal appearance/no adnexal mass.  Other findings  Trace free fluid in the pelvis.  IMPRESSION: IUD within the endometrial canal, with possible protrusion of its left arm into the myometrium.  Benign-appearing 3.4 cm right ovarian cyst. This has benign characteristics and is a common finding in premenopausal females. No imaging follow up is required. This follows consensus guidelines: Simple Adnexal Cysts: SRU Consensus Conference Update on Follow-up and Reporting. Radiology 2019; [id number].  Normal appearance of the left ovary.  MDM Reviewed note from PCP today.  BP on discharge was 128/74  Assessment and Plan  LLQ abdominal pain Possible embedment of IUD arm  Plan Follow up with Dr. Simona Huh about the IUD and the ultrasound result There is not a severe problem today.  STD testing pending from the PCP office. Follow up with your PCP for your other medical needs   Virginia Rochester 08/22/2018, 4:13 PM

## 2018-08-23 LAB — PREGNANCY, URINE: PREG TEST UR: NEGATIVE

## 2018-08-25 ENCOUNTER — Encounter: Payer: Self-pay | Admitting: Family Medicine

## 2018-08-25 ENCOUNTER — Other Ambulatory Visit: Payer: Self-pay

## 2018-08-25 LAB — WOUND CULTURE: ORGANISM ID, BACTERIA: NONE SEEN

## 2018-08-25 MED ORDER — DOXYCYCLINE HYCLATE 100 MG PO CAPS: 100.0000 mg | ORAL_CAPSULE | Freq: Two times a day (BID) | ORAL | 0 refills | Status: DC

## 2018-08-25 NOTE — Addendum Note (Signed)
Addended by: Scot Jun on: 08/25/2018 10:59 AM   Modules accepted: Orders

## 2018-08-27 ENCOUNTER — Other Ambulatory Visit: Payer: Self-pay | Admitting: Obstetrics and Gynecology

## 2018-08-27 ENCOUNTER — Encounter: Payer: Self-pay | Admitting: Family Medicine

## 2018-08-27 ENCOUNTER — Ambulatory Visit (INDEPENDENT_AMBULATORY_CARE_PROVIDER_SITE_OTHER): Payer: BLUE CROSS/BLUE SHIELD | Admitting: Family Medicine

## 2018-08-27 VITALS — BP 134/92 | HR 106 | Temp 98.3°F | Resp 17 | Ht 65.0 in | Wt 199.0 lb

## 2018-08-27 DIAGNOSIS — L0291 Cutaneous abscess, unspecified: Secondary | ICD-10-CM

## 2018-08-27 DIAGNOSIS — Z5189 Encounter for other specified aftercare: Secondary | ICD-10-CM

## 2018-08-27 LAB — CERVICOVAGINAL ANCILLARY ONLY
BACTERIAL VAGINITIS: NEGATIVE
CANDIDA VAGINITIS: NEGATIVE
Chlamydia: NEGATIVE
NEISSERIA GONORRHEA: NEGATIVE
TRICH (WINDOWPATH): NEGATIVE

## 2018-08-27 NOTE — Progress Notes (Signed)
Wound Check: I&D follow-up Wound appears to be healing without complication. Erythema has resolved. negative for increased warmth. Mild serosanguinous drainage present. Mild induration present.  Plan: Continue antibiotics. Monitor for signs of infection. Continue daily dressing changes in until wound completely heal.  Molli Barrows, FNP Primary Care at [facility] [address], [place] [phone]: [phone]

## 2018-08-28 NOTE — Progress Notes (Signed)
Patient notified of results during her office visit. Expressed understanding.

## 2018-09-11 DIAGNOSIS — L292 Pruritus vulvae: Secondary | ICD-10-CM | POA: Diagnosis not present

## 2018-09-11 DIAGNOSIS — Z30016 Encounter for initial prescription of transdermal patch hormonal contraceptive device: Secondary | ICD-10-CM | POA: Diagnosis not present

## 2018-09-11 DIAGNOSIS — Z30432 Encounter for removal of intrauterine contraceptive device: Secondary | ICD-10-CM | POA: Diagnosis not present

## 2018-09-22 ENCOUNTER — Telehealth: Payer: Self-pay | Admitting: Family Medicine

## 2018-09-22 MED ORDER — SCOPOLAMINE 1 MG/3DAYS TD PT72: 1.0000 | MEDICATED_PATCH | TRANSDERMAL | 12 refills | Status: DC

## 2018-09-22 MED FILL — IMIPRAMINE PAMOATE 125 MG C: 125 | 30 days supply | Qty: 30 | Fill #3

## 2018-09-22 NOTE — Telephone Encounter (Signed)
Scopolamine patch as ordered.

## 2018-09-22 NOTE — Telephone Encounter (Signed)
Please advise 

## 2018-09-22 NOTE — Telephone Encounter (Signed)
Patient would like a nauseua patch, patient uses [employer]. Please follow up

## 2018-10-24 MED FILL — IMIPRAMINE PAMOATE 125 MG C: 125 | 30 days supply | Qty: 30 | Fill #4

## 2018-10-26 ENCOUNTER — Other Ambulatory Visit: Payer: Self-pay

## 2018-10-26 ENCOUNTER — Encounter (HOSPITAL_BASED_OUTPATIENT_CLINIC_OR_DEPARTMENT_OTHER): Payer: Self-pay | Admitting: Emergency Medicine

## 2018-10-26 ENCOUNTER — Emergency Department (HOSPITAL_BASED_OUTPATIENT_CLINIC_OR_DEPARTMENT_OTHER)
Admission: EM | Admit: 2018-10-26 | Discharge: 2018-10-26 | Disposition: A | Discharge status: Home / Self Care | Payer: BLUE CROSS/BLUE SHIELD | Attending: Emergency Medicine | Admitting: Emergency Medicine

## 2018-10-26 DIAGNOSIS — Z79899 Other long term (current) drug therapy: Secondary | ICD-10-CM | POA: Diagnosis not present

## 2018-10-26 DIAGNOSIS — F17200 Nicotine dependence, unspecified, uncomplicated: Secondary | ICD-10-CM | POA: Insufficient documentation

## 2018-10-26 DIAGNOSIS — R1031 Right lower quadrant pain: Secondary | ICD-10-CM | POA: Diagnosis not present

## 2018-10-26 DIAGNOSIS — R112 Nausea with vomiting, unspecified: Secondary | ICD-10-CM | POA: Insufficient documentation

## 2018-10-26 LAB — COMPREHENSIVE METABOLIC PANEL
ALT: 16 U/L (ref 0–44)
AST: 17 U/L (ref 15–41)
Albumin: 3.9 g/dL (ref 3.5–5.0)
Alkaline Phosphatase: 45 U/L (ref 38–126)
Anion gap: 9 (ref 5–15)
BILIRUBIN TOTAL: 0.4 mg/dL (ref 0.3–1.2)
BUN: 12 mg/dL (ref 6–20)
CO2: 22 mmol/L (ref 22–32)
CREATININE: 0.57 mg/dL (ref 0.44–1.00)
Calcium: 9.1 mg/dL (ref 8.9–10.3)
Chloride: 105 mmol/L (ref 98–111)
Glucose, Bld: 112 mg/dL — ABNORMAL HIGH (ref 70–99)
Potassium: 3.9 mmol/L (ref 3.5–5.1)
Sodium: 136 mmol/L (ref 135–145)
Total Protein: 6.7 g/dL (ref 6.5–8.1)

## 2018-10-26 LAB — CBC
HCT: 40.4 % (ref 36.0–46.0)
Hemoglobin: 13.6 g/dL (ref 12.0–15.0)
MCH: 30.4 pg (ref 26.0–34.0)
MCHC: 33.7 g/dL (ref 30.0–36.0)
MCV: 90.2 fL (ref 80.0–100.0)
NRBC: 0 % (ref 0.0–0.2)
PLATELETS: 394 10*3/uL (ref 150–400)
RBC: 4.48 MIL/uL (ref 3.87–5.11)
RDW: 11.9 % (ref 11.5–15.5)
WBC: 11.2 10*3/uL — AB (ref 4.0–10.5)

## 2018-10-26 LAB — URINALYSIS, ROUTINE W REFLEX MICROSCOPIC
BILIRUBIN URINE: NEGATIVE
Glucose, UA: NEGATIVE mg/dL
Hgb urine dipstick: NEGATIVE
KETONES UR: 15 mg/dL — AB
LEUKOCYTES UA: NEGATIVE
NITRITE: NEGATIVE
Protein, ur: NEGATIVE mg/dL
Specific Gravity, Urine: >1.03 — ABNORMAL HIGH (ref 1.005–1.030)
pH: 6 (ref 5.0–8.0)

## 2018-10-26 LAB — LIPASE, BLOOD: LIPASE: 26 U/L (ref 11–51)

## 2018-10-26 LAB — PREGNANCY, URINE: PREG TEST UR: NEGATIVE

## 2018-10-26 NOTE — ED Notes (Signed)
ED Provider at bedside. 

## 2018-10-26 NOTE — ED Notes (Signed)
pts family member came to [employer] stating patients pain had subsided and was wondering how long it was until she was going to be seen. RN updated patients family member and informed him that it would be best if she stayed to get evaluated. Will continue to monitor

## 2018-10-26 NOTE — ED Triage Notes (Signed)
Patient states that she has had right lower pelvic / quad pain x 4 hours - reports nausea but denies any vomiting

## 2018-10-26 NOTE — ED Notes (Signed)
Pt understood dc material. NAD noted. 

## 2018-10-26 NOTE — Discharge Instructions (Addendum)
If pain comes back, go to [facility] MAU where they can do an ultrasound (if it iw between 8:00 AM and 4:00 PM, you can come here and get an ultrasound).

## 2018-10-26 NOTE — ED Provider Notes (Signed)
[facility] EMERGENCY DEPARTMENT Provider Note   CSN: [id number] Arrival date & time: 10/26/18  0021     History   Chief Complaint Chief Complaint  Patient presents with  . Abdominal Pain    HPI Kelly Buck is a 38 y.o. female.  The history is provided by the patient.  She has history of endometriosis and degenerative disc disease and comes in with sudden onset of severe right lower quadrant pain at about 5 PM.  Pain did not radiate.  There was associated nausea and she did vomit once.  She denies any urinary difficulty and denies any constipation or diarrhea.  Nothing made pain better, nothing made it worse.  Pain persisted until 2 AM, at which point it suddenly went away.  There is some mild residual soreness in the right lower abdomen.  Last menses was 12 days ago and was normal.  She is using condoms for contraception.  Past Medical History:  Diagnosis Date  . Anxiety   . Complication of anesthesia   . Endometriosis   . Palpitations   . PONV (postoperative nausea and vomiting)   . Vaginal Pap smear, abnormal     Patient Active Problem List   Diagnosis Date Noted  . Degenerative disc disease at L5-S1 level 07/25/2017  . Postpartum endometritis 06/04/2014  . Retained products of conception after delivery without hemorrhage 06/01/2014  . Postpartum state 05/21/2014  . Oligohydramnios 05/20/2014  . Antepartum non-reassuring fetal heart rate or rhythm affecting care of mother 04/28/2014    Past Surgical History:  Procedure Laterality Date  . CARPAL TUNNEL RELEASE Right   . CERVICAL BIOPSY    . DILATION AND EVACUATION N/A 06/01/2014   Procedure: DILATATION AND EVACUATION;  Surgeon: [name], MD;  Location: [facility];  Service: Gynecology;  Laterality: N/A;  . DILATION AND EVACUATION N/A 06/04/2014   Procedure: DILATATION AND EVACUATION;  Surgeon: [name], MD;  Location: [facility];  Service: Gynecology;  Laterality: N/A;  . LUMBAR  LAMINECTOMY/DECOMPRESSION MICRODISCECTOMY Right 10/31/2016   Procedure: RIGHT SIDED LUMBAR 5-SACRUM 1 MICRODISCECTOMY;  Surgeon: Phylliss Bob, MD;  Location: [facility];  Service: Orthopedics;  Laterality: Right;  RIGHT SIDED LUMBAR 5-SACRUM 1 MICRODISCECTOMY  . PLACEMENT OF BREAST IMPLANTS    . tummy tuck       OB History    Gravida  5   Para  4   Term  4   Preterm      AB  1   Living  4     SAB  1   TAB      Ectopic      Multiple      Live Births  4            Home Medications    Prior to Admission medications   Medication Sig Start Date End Date Taking? Authorizing Provider  doxycycline (VIBRAMYCIN) 100 MG capsule Take 1 capsule (100 mg total) by mouth 2 (two) times daily. 08/25/18   Scot Jun, FNP  imipramine (TOFRANIL-PM) 125 MG capsule Take 1 capsule by mouth daily. 08/18/18   [provider]  naproxen sodium (ALEVE) 220 MG tablet Take 220 mg by mouth 2 (two) times daily.    [provider]  PARAGARD INTRAUTERINE COPPER IU by Intrauterine route.    [provider]  scopolamine (TRANSDERM-SCOP, 1.5 MG,) 1 MG/3DAYS Place 1 patch (1.5 mg total) onto the skin every 3 (three) days. 09/22/18   Scot Jun,  FNP    Family History Family History  Problem Relation Age of Onset  . Healthy Mother   . Carpal tunnel syndrome Mother   . Healthy Father   . Healthy Brother   . Healthy Brother   . Hodgkin's lymphoma Maternal Grandmother   . Lupus Paternal Grandmother   . Dementia Paternal Grandmother   . Fibromyalgia Paternal Grandmother     Social History Social History   Tobacco Use  . Smoking status: Current Every Day Smoker    Packs/day: 0.25    Years: 4.00    Pack years: 1.00    Types: Cigarettes  . Smokeless tobacco: Never Used  Substance Use Topics  . Alcohol use: Yes    Comment: 2/wk  . Drug use: No     Allergies   No known allergies   Review of Systems Review of Systems  All other systems reviewed and  are negative.    Physical Exam Updated Vital Signs BP (!) 128/99 (BP Location: Left Arm)   Pulse (!) 105   Temp 98.4 F (36.9 C) (Oral)   Resp 18   Ht 5' 5.5" (1.664 m)   Wt 86.2 kg   LMP 10/12/2018   SpO2 100%   BMI 31.14 kg/m   Physical Exam Vitals signs and nursing note reviewed.    38 year old female, resting comfortably and in no acute distress. Vital signs are significant for mild elevation of heart rate and mild elevation of diastolic blood pressure. Oxygen saturation is 100%, which is normal. Head is normocephalic and atraumatic. PERRLA, EOMI. Oropharynx is clear. Neck is nontender and supple without adenopathy or JVD. Back is nontender and there is no CVA tenderness. Lungs are clear without rales, wheezes, or rhonchi. Chest is nontender. Heart has regular rate and rhythm without murmur. Abdomen is soft, flat, nontender without masses or hepatosplenomegaly and peristalsis is normoactive.  There is no tenderness in the right lower quadrant, even to deep palpation. Extremities have no cyanosis or edema, full range of motion is present. Skin is warm and dry without rash. Neurologic: Mental status is normal, cranial nerves are intact, there are no motor or sensory deficits.  ED Treatments / Results  Labs (all labs ordered are listed, but only abnormal results are displayed) Labs Reviewed  COMPREHENSIVE METABOLIC PANEL - Abnormal; Notable for the following components:      Result Value   Glucose, Bld 112 (*)    All other components within normal limits  CBC - Abnormal; Notable for the following components:   WBC 11.2 (*)    All other components within normal limits  URINALYSIS, ROUTINE W REFLEX MICROSCOPIC - Abnormal; Notable for the following components:   APPearance HAZY (*)    Specific Gravity, Urine >1.030 (*)    Ketones, ur 15 (*)    All other components within normal limits  LIPASE, BLOOD  PREGNANCY, URINE   Procedures Procedures   Medications Ordered  in ED Medications - No data to display   Initial Impression / Assessment and Plan / ED Course  I have reviewed the triage vital signs and the nursing notes.  Right lower quadrant pain which has spontaneously resolved.  Doubt appendicitis with spontaneous resolution.  Consider ovarian torsion with spontaneous detorsion, consider renal colic.  Old records are reviewed, and she has had CT scans of her abdomen in the past showing no evidence of nephrolithiasis, and no evidence of hematuria today, making renal colic a very unlikely diagnosis.  No indication for  imaging as his pain had completely resolved and exam is completely benign.  I have discussed this with the patient, advised to return to an ED that is ultrasound capable immediately if pain recurs.  Final Clinical Impressions(s) / ED Diagnoses   Final diagnoses:  Acute right lower quadrant pain    ED Discharge Orders    None       Delora Fuel, MD 92/11/94 [phone]

## 2018-11-26 MED FILL — IMIPRAMINE PAMOATE 125 MG C: 125 | 30 days supply | Qty: 30 | Fill #5

## 2018-12-25 ENCOUNTER — Telehealth: Payer: Self-pay | Admitting: Family Medicine

## 2018-12-25 DIAGNOSIS — M542 Cervicalgia: Secondary | ICD-10-CM | POA: Diagnosis not present

## 2018-12-25 DIAGNOSIS — M25511 Pain in right shoulder: Secondary | ICD-10-CM | POA: Diagnosis not present

## 2018-12-25 MED FILL — IMIPRAMINE PAMOATE 125 MG C: 125 | 30 days supply | Qty: 30 | Fill #0

## 2018-12-25 NOTE — Telephone Encounter (Signed)
Notify patient she will need to schedule an appointment. I have never prescribed her a imipramine (TOFRANIL-PM) 125 MG nor treated for a condition in which this medication is indicated. Therefore she will require an evaluation

## 2018-12-30 NOTE — Telephone Encounter (Signed)
Called patient to schedule appointment, patient stated she "found someone else to give it to her."

## 2019-01-01 ENCOUNTER — Encounter: Payer: Self-pay | Admitting: Family Medicine

## 2019-01-01 NOTE — Progress Notes (Signed)
Patient is followed by Dr. Ophelia Charter, MD @ [facility] for Right cuff syndrome.

## 2019-01-13 ENCOUNTER — Ambulatory Visit: Payer: Self-pay | Admitting: Family Medicine

## 2019-01-13 ENCOUNTER — Encounter

## 2019-01-26 DIAGNOSIS — B354 Tinea corporis: Secondary | ICD-10-CM | POA: Diagnosis not present

## 2019-01-26 DIAGNOSIS — Z9889 Other specified postprocedural states: Secondary | ICD-10-CM | POA: Diagnosis not present

## 2019-01-26 DIAGNOSIS — G894 Chronic pain syndrome: Secondary | ICD-10-CM | POA: Diagnosis not present

## 2019-01-27 DIAGNOSIS — M25511 Pain in right shoulder: Secondary | ICD-10-CM | POA: Diagnosis not present

## 2019-01-28 MED FILL — IMIPRAMINE PAMOATE 125 MG C: 125 | 30 days supply | Qty: 30 | Fill #0

## 2019-02-23 MED FILL — IMIPRAMINE PAMOATE 125 MG C: 125 | 30 days supply | Qty: 30 | Fill #1

## 2019-03-27 MED FILL — IMIPRAMINE PAMOATE 125 MG C: 125 | 30 days supply | Qty: 30 | Fill #2

## 2019-04-23 MED FILL — IMIPRAMINE PAMOATE 125 MG C: 125 | 30 days supply | Qty: 30 | Fill #3

## 2019-05-26 MED FILL — IMIPRAMINE PAMOATE 125 MG C: 125 | 30 days supply | Qty: 30 | Fill #0

## 2019-05-27 DIAGNOSIS — N930 Postcoital and contact bleeding: Secondary | ICD-10-CM | POA: Diagnosis not present

## 2019-05-27 DIAGNOSIS — N809 Endometriosis, unspecified: Secondary | ICD-10-CM | POA: Diagnosis not present

## 2019-05-27 DIAGNOSIS — R1031 Right lower quadrant pain: Secondary | ICD-10-CM | POA: Diagnosis not present

## 2019-06-15 DIAGNOSIS — R1031 Right lower quadrant pain: Secondary | ICD-10-CM | POA: Diagnosis not present

## 2019-06-15 DIAGNOSIS — N809 Endometriosis, unspecified: Secondary | ICD-10-CM | POA: Diagnosis not present

## 2019-06-26 MED FILL — IMIPRAMINE PAMOATE 125 MG C: 125 | 30 days supply | Qty: 30 | Fill #1

## 2019-07-29 DIAGNOSIS — N912 Amenorrhea, unspecified: Secondary | ICD-10-CM | POA: Diagnosis not present

## 2019-07-29 DIAGNOSIS — Z3202 Encounter for pregnancy test, result negative: Secondary | ICD-10-CM | POA: Diagnosis not present

## 2019-08-07 ENCOUNTER — Encounter (HOSPITAL_COMMUNITY): Payer: Self-pay | Admitting: Student

## 2019-08-07 ENCOUNTER — Other Ambulatory Visit: Payer: Self-pay

## 2019-08-07 ENCOUNTER — Inpatient Hospital Stay (HOSPITAL_COMMUNITY)
Admission: AD | Admit: 2019-08-07 | Discharge: 2019-08-07 | Disposition: A | Discharge status: Home / Self Care | Payer: BLUE CROSS/BLUE SHIELD | Attending: Obstetrics and Gynecology | Admitting: Obstetrics and Gynecology

## 2019-08-07 DIAGNOSIS — Z3202 Encounter for pregnancy test, result negative: Secondary | ICD-10-CM | POA: Insufficient documentation

## 2019-08-07 DIAGNOSIS — N939 Abnormal uterine and vaginal bleeding, unspecified: Secondary | ICD-10-CM | POA: Diagnosis not present

## 2019-08-07 LAB — HCG, QUANTITATIVE, PREGNANCY: hCG, Beta Chain, Quant, S: 1 m[IU]/mL (ref <5)

## 2019-08-07 LAB — POCT PREGNANCY, URINE: Preg Test, Ur: NEGATIVE

## 2019-08-07 NOTE — MAU Provider Note (Signed)
First Provider Initiated Contact with Patient 08/07/19 [phone]      S Ms. Kelly Buck is a 38 y.o. [phone] non-pregnant female who presents to MAU today with complaint of vaginal bleeding. Has not had a period in 3 months & reports pregnancy symptoms including nausea & breast tenderness. Had negative pregnancy tests in the past few months but reports a positive test 3 days ago. Has an appointment with [facility] next week for pregnancy care. Started having vaginal bleeding this morning. Passed a few small clots but has been spotting since then. No abdominal pain.    O BP (!) 150/99 (BP Location: Right Arm)   Pulse (!) 111   Temp 98.2 F (36.8 C) (Oral)   Resp 18   Ht 5\' 5"  (1.651 m)   Wt 103.3 kg   LMP 05/28/2019   BMI 37.89 kg/m  Physical Exam  Nursing note and vitals reviewed. Constitutional: She appears well-developed and well-nourished. No distress.  HENT:  Head: Normocephalic and atraumatic.  Respiratory: Effort normal. No respiratory distress.  Skin: She is not diaphoretic.  Psychiatric: She has a normal mood and affect. Her behavior is normal. Judgment and thought content normal.    A Non pregnant female Medical screening exam complete 1. Negative pregnancy test      P Discharge from MAU in stable condition Encouraged to call [facility] reschedule appointment to evaluate for irregular menses vs miscarriage.   Jorje Guild, NP 08/07/2019 11:06 AM

## 2019-08-07 NOTE — MAU Note (Signed)
Woke up with bright red bleeding this morning. Found out preg 2 days ago. Missed period, last was in Aug. Has OB appt next wk.  Mild cramping 'here and there', not in any pain.

## 2019-08-07 NOTE — Discharge Instructions (Signed)
Home Pregnancy Test Information ° °A home pregnancy test helps you determine whether you are pregnant or not. There are several types of home pregnancy tests that can be bought at a grocery store or pharmacy. °What is being tested? °A home pregnancy test detects the presence of a hormone in your urine. The hormone is produced by cells of the placenta (human chorionic gonadotropin, or hCG). The placenta is the organ that forms to nourish and support a developing baby. °How are pregnancy tests done? °Home pregnancy tests require a urine sample. °· Most kits use a plastic testing device with a strip of paper that indicates whether there is hCG in your urine. °· Follow the test package instructions very carefully for how to test your urine. Depending on the test, you may need to: °? Urinate directly onto the stick. °? Urinate into a cup. °· Wait for the results as directed by the package instructions. The amount of time may be different for each type of test. °· Follow the test package instructions for how to read your test results. Depending on the test, results may be displayed as: °? A plus or a minus sign. °? One or two lines. °? "Pregnant" or "not pregnant." °· For best results, use your first urine of the morning. That is when the concentration of hCG is highest. °How accurate are home pregnancy tests? °Home pregnancy tests are very accurate when: °· You are at least 3-[redacted] weeks pregnant. °· It has been 1-2 weeks since your missed period. °· You use the test according to the package instructions. °What can interfere with home pregnancy test results? °Sometimes, a home pregnancy test may report that you are pregnant when you are not pregnant (false-positive result). This can happen if you: °· Are taking certain medicines, such as: °? Medicine to control seizures. °? Anti-anxiety medicine. °? Fertility medicine with hCG. °· Have a medical condition that affects your hormone levels. °· Had a recent pregnancy loss  (miscarriage) or abortion. °Sometimes, a home pregnancy test may report that you are not pregnant when you are pregnant (false-negative result). This can happen if you: °· Took the test too early in your pregnancy. Before 3-4 weeks of pregnancy, there may not be enough hCG to detect. °· Drank a lot of liquid before the test. °· Used an expired pregnancy test. °· Are taking certain medicines, such as antihistamines or water pills (diuretics). °What should I do if I have a positive pregnancy test? °If you have a positive home pregnancy test, schedule an appointment with your health care provider. You might need additional testing to confirm the pregnancy. °What should I do if I have a negative pregnancy test? °If you have a negative home pregnancy test but still have symptoms of pregnancy, contact your health care provider. Your health care provider will test a sample of your blood to check for pregnancy. In some cases, a blood test will return a positive result even if a urine test was negative because blood tests are more sensitive. This means blood tests can detect hCG earlier than home pregnancy tests. °Follow these instructions at home: °If you are pregnant, planning to become pregnant, or think you may be pregnant: °· Do not drink alcohol. °· Do not use street drugs. °· Do not use any products that contain nicotine or tobacco, such as cigarettes and e-cigarettes. If you need help quitting, ask your health care provider. °· Take a prenatal vitamin that contains at least 400 mcg of folic   acid daily. °Summary °· A home pregnancy test helps you determine whether you are pregnant or not by detecting the presence of the hormone human chorionic gonadotropin (hCG) in a sample of your urine. °· Follow the test package instructions very carefully. For best results, use your first urine of the morning. That is when the concentration of hCG is highest. °· Home pregnancy tests are very accurate when you are 3-[redacted] weeks  pregnant or when it has been 1-2 weeks since your missed period. °· A home pregnancy test may report that you are pregnant when you are not pregnant or that you are not pregnant when you are pregnant. °· Contact your health care provider to confirm your results. Your health care provider will test a sample of your blood to check for pregnancy. °This information is not intended to replace advice given to you by your health care provider. Make sure you discuss any questions you have with your health care provider. °Document Released: 10/11/2003 Document Revised: 01/29/2019 Document Reviewed: 10/21/2017 °Elsevier Patient Education © 2020 [employer] ° °

## 2019-08-11 MED FILL — IMIPRAMINE HCL 50 MG TABLET: 50 | 30 days supply | Qty: 60 | Fill #0

## 2019-09-09 MED FILL — IMIPRAMINE HCL 50 MG TABLET: 50 | 30 days supply | Qty: 60 | Fill #1

## 2019-10-09 MED FILL — IMIPRAMINE HCL 50 MG TABLET: 50 | 30 days supply | Qty: 60 | Fill #2

## 2019-10-27 MED FILL — IMIPRAMINE HCL 50 MG TABLET: 50 | 30 days supply | Qty: 120 | Fill #0

## 2019-11-24 MED FILL — IMIPRAMINE HCL 50 MG TABLET: 50 | 30 days supply | Qty: 120 | Fill #1

## 2019-12-01 MED FILL — DULoxetine HCL 30 MG CPEP: 30 | 30 days supply | Qty: 60 | Fill #0

## 2020-01-26 MED FILL — MELOXICAM 15 MG TABLET: 15 | 90 days supply | Qty: 90 | Fill #0

## 2020-01-28 MED FILL — PREGABALIN 75 MG CAPS: 75 | 30 days supply | Qty: 60 | Fill #0

## 2020-02-01 MED FILL — DULoxetine HCL 30 MG CPEP: 30 | 30 days supply | Qty: 60 | Fill #1

## 2020-05-02 MED FILL — PREGABALIN 75 MG CAPS: 75 | 30 days supply | Qty: 60 | Fill #1

## 2020-05-02 MED FILL — IMIPRAMINE HCL 50 MG TABLET: 50 | 30 days supply | Qty: 120 | Fill #2

## 2020-05-19 ENCOUNTER — Other Ambulatory Visit: Payer: Self-pay | Admitting: Orthopedic Surgery

## 2020-05-24 ENCOUNTER — Encounter (HOSPITAL_BASED_OUTPATIENT_CLINIC_OR_DEPARTMENT_OTHER): Payer: Self-pay | Admitting: Orthopedic Surgery

## 2020-05-24 ENCOUNTER — Other Ambulatory Visit: Payer: Self-pay

## 2020-05-31 ENCOUNTER — Other Ambulatory Visit (HOSPITAL_COMMUNITY)
Admission: RE | Admit: 2020-05-31 | Discharge: 2020-05-31 | Disposition: A | Discharge status: Home / Self Care | Payer: No Typology Code available for payment source | Source: Ambulatory Visit | Attending: Orthopedic Surgery | Admitting: Orthopedic Surgery

## 2020-05-31 DIAGNOSIS — Z20822 Contact with and (suspected) exposure to covid-19: Secondary | ICD-10-CM | POA: Diagnosis not present

## 2020-05-31 DIAGNOSIS — Z01812 Encounter for preprocedural laboratory examination: Secondary | ICD-10-CM | POA: Diagnosis present

## 2020-05-31 LAB — SARS CORONAVIRUS 2 (TAT 6-24 HRS): SARS Coronavirus 2: NEGATIVE

## 2020-05-31 NOTE — Progress Notes (Signed)

## 2020-06-02 ENCOUNTER — Encounter (HOSPITAL_BASED_OUTPATIENT_CLINIC_OR_DEPARTMENT_OTHER): Payer: Self-pay | Admitting: Orthopedic Surgery

## 2020-06-02 ENCOUNTER — Encounter (HOSPITAL_BASED_OUTPATIENT_CLINIC_OR_DEPARTMENT_OTHER)
Admission: RE | Disposition: A | Discharge status: Home / Self Care | Payer: Self-pay | Source: Home / Self Care | Attending: Orthopedic Surgery

## 2020-06-02 ENCOUNTER — Ambulatory Visit (HOSPITAL_BASED_OUTPATIENT_CLINIC_OR_DEPARTMENT_OTHER): Payer: No Typology Code available for payment source | Admitting: Certified Registered Nurse Anesthetist

## 2020-06-02 ENCOUNTER — Other Ambulatory Visit: Payer: Self-pay

## 2020-06-02 ENCOUNTER — Ambulatory Visit (HOSPITAL_BASED_OUTPATIENT_CLINIC_OR_DEPARTMENT_OTHER)
Admission: RE | Admit: 2020-06-02 | Discharge: 2020-06-02 | Disposition: A | Discharge status: Home / Self Care | Payer: No Typology Code available for payment source | Attending: Orthopedic Surgery | Admitting: Orthopedic Surgery

## 2020-06-02 DIAGNOSIS — Z87891 Personal history of nicotine dependence: Secondary | ICD-10-CM | POA: Diagnosis not present

## 2020-06-02 DIAGNOSIS — G5602 Carpal tunnel syndrome, left upper limb: Secondary | ICD-10-CM | POA: Insufficient documentation

## 2020-06-02 DIAGNOSIS — Z6841 Body Mass Index (BMI) 40.0 and over, adult: Secondary | ICD-10-CM | POA: Diagnosis not present

## 2020-06-02 HISTORY — PX: CARPAL TUNNEL RELEASE: SHX101

## 2020-06-02 LAB — POCT PREGNANCY, URINE: Preg Test, Ur: NEGATIVE

## 2020-06-02 SURGERY — CARPAL TUNNEL RELEASE
Anesthesia: Regional | Site: Hand | Laterality: Left

## 2020-06-02 MED ORDER — ACETAMINOPHEN 500 MG PO TABS
1000.0000 mg | ORAL_TABLET | Freq: Once | ORAL | Status: AC
  Administered 2020-06-02: 1000 mg via ORAL

## 2020-06-02 MED ORDER — HYDROCODONE-ACETAMINOPHEN 5-325 MG PO TABS: ORAL_TABLET | ORAL | 0 refills | Status: DC

## 2020-06-02 MED ORDER — CEFAZOLIN SODIUM-DEXTROSE 2-4 GM/100ML-% IV SOLN
INTRAVENOUS | Status: AC
  Filled 2020-06-02: qty 100

## 2020-06-02 MED ORDER — PROPOFOL 10 MG/ML IV BOLUS
INTRAVENOUS | Status: DC | PRN
  Administered 2020-06-02: 20 mg via INTRAVENOUS
  Administered 2020-06-02: 30 mg via INTRAVENOUS

## 2020-06-02 MED ORDER — PROPOFOL 500 MG/50ML IV EMUL
INTRAVENOUS | Status: DC | PRN
  Administered 2020-06-02: 100 ug/kg/min via INTRAVENOUS

## 2020-06-02 MED ORDER — ONDANSETRON HCL 4 MG/2ML IJ SOLN
INTRAMUSCULAR | Status: AC
  Filled 2020-06-02: qty 2

## 2020-06-02 MED ORDER — ONDANSETRON HCL 4 MG/2ML IJ SOLN: 4.0000 mg | Freq: Once | INTRAMUSCULAR | Status: DC | PRN

## 2020-06-02 MED ORDER — ACETAMINOPHEN 500 MG PO TABS
ORAL_TABLET | ORAL | Status: AC
  Filled 2020-06-02: qty 2

## 2020-06-02 MED ORDER — MIDAZOLAM HCL 2 MG/2ML IJ SOLN
INTRAMUSCULAR | Status: AC
  Filled 2020-06-02: qty 2

## 2020-06-02 MED ORDER — ONDANSETRON HCL 4 MG/2ML IJ SOLN
INTRAMUSCULAR | Status: DC | PRN
  Administered 2020-06-02: 4 mg via INTRAVENOUS

## 2020-06-02 MED ORDER — MIDAZOLAM HCL 2 MG/2ML IJ SOLN
INTRAMUSCULAR | Status: DC | PRN
  Administered 2020-06-02: 2 mg via INTRAVENOUS

## 2020-06-02 MED ORDER — LIDOCAINE HCL (PF) 0.5 % IJ SOLN
INTRAMUSCULAR | Status: DC | PRN
  Administered 2020-06-02: 35 mL via INTRAVENOUS

## 2020-06-02 MED ORDER — FENTANYL CITRATE (PF) 100 MCG/2ML IJ SOLN: 25.0000 ug | INTRAMUSCULAR | Status: DC | PRN

## 2020-06-02 MED ORDER — BUPIVACAINE HCL (PF) 0.25 % IJ SOLN
INTRAMUSCULAR | Status: DC | PRN
  Administered 2020-06-02: 10 mL

## 2020-06-02 MED ORDER — CEFAZOLIN SODIUM-DEXTROSE 2-4 GM/100ML-% IV SOLN
2.0000 g | INTRAVENOUS | Status: AC
  Administered 2020-06-02: 2 g via INTRAVENOUS

## 2020-06-02 MED ORDER — LACTATED RINGERS IV SOLN: INTRAVENOUS | Status: DC

## 2020-06-02 MED ORDER — FENTANYL CITRATE (PF) 100 MCG/2ML IJ SOLN
INTRAMUSCULAR | Status: AC
  Filled 2020-06-02: qty 2

## 2020-06-02 MED ORDER — FENTANYL CITRATE (PF) 100 MCG/2ML IJ SOLN
INTRAMUSCULAR | Status: DC | PRN
  Administered 2020-06-02 (×2): 50 ug via INTRAVENOUS

## 2020-06-02 SURGICAL SUPPLY — 39 items
APL PRP STRL LF DISP 70% ISPRP (MISCELLANEOUS) ×1
BLADE SURG 15 STRL LF DISP TIS (BLADE) ×2 IMPLANT
BLADE SURG 15 STRL SS (BLADE) ×6
BNDG CMPR 9X4 STRL LF SNTH (GAUZE/BANDAGES/DRESSINGS)
BNDG ELASTIC 3X5.8 VLCR STR LF (GAUZE/BANDAGES/DRESSINGS) ×3 IMPLANT
BNDG ESMARK 4X9 LF (GAUZE/BANDAGES/DRESSINGS) IMPLANT
BNDG GAUZE ELAST 4 BULKY (GAUZE/BANDAGES/DRESSINGS) ×3 IMPLANT
CHLORAPREP W/TINT 26 (MISCELLANEOUS) ×3 IMPLANT
CORD BIPOLAR FORCEPS 12FT (ELECTRODE) ×3 IMPLANT
COVER BACK TABLE 60X90IN (DRAPES) ×3 IMPLANT
COVER MAYO STAND STRL (DRAPES) ×3 IMPLANT
COVER WAND RF STERILE (DRAPES) IMPLANT
CUFF TOURN SGL QUICK 18X4 (TOURNIQUET CUFF) ×3 IMPLANT
DRAPE EXTREMITY T 121X128X90 (DISPOSABLE) ×3 IMPLANT
DRAPE SURG 17X23 STRL (DRAPES) ×3 IMPLANT
DRSG PAD ABDOMINAL 8X10 ST (GAUZE/BANDAGES/DRESSINGS) ×3 IMPLANT
GAUZE SPONGE 4X4 12PLY STRL (GAUZE/BANDAGES/DRESSINGS) ×3 IMPLANT
GAUZE XEROFORM 1X8 LF (GAUZE/BANDAGES/DRESSINGS) ×3 IMPLANT
GLOVE BIO SURGEON STRL SZ7 (GLOVE) ×2 IMPLANT
GLOVE BIO SURGEON STRL SZ7.5 (GLOVE) ×3 IMPLANT
GLOVE BIOGEL PI IND STRL 7.0 (GLOVE) IMPLANT
GLOVE BIOGEL PI IND STRL 8 (GLOVE) ×1 IMPLANT
GLOVE BIOGEL PI INDICATOR 7.0 (GLOVE) ×4
GLOVE BIOGEL PI INDICATOR 8 (GLOVE) ×2
GOWN STRL REUS W/ TWL LRG LVL3 (GOWN DISPOSABLE) ×1 IMPLANT
GOWN STRL REUS W/TWL LRG LVL3 (GOWN DISPOSABLE) ×3
GOWN STRL REUS W/TWL XL LVL3 (GOWN DISPOSABLE) ×3 IMPLANT
NDL HYPO 25X1 1.5 SAFETY (NEEDLE) ×1 IMPLANT
NEEDLE HYPO 25X1 1.5 SAFETY (NEEDLE) ×3 IMPLANT
NS IRRIG 1000ML POUR BTL (IV SOLUTION) ×3 IMPLANT
PACK BASIN DAY SURGERY FS (CUSTOM PROCEDURE TRAY) ×3 IMPLANT
PADDING CAST ABS 4INX4YD NS (CAST SUPPLIES)
PADDING CAST ABS COTTON 4X4 ST (CAST SUPPLIES) ×1 IMPLANT
STOCKINETTE 4X48 STRL (DRAPES) ×3 IMPLANT
SUT ETHILON 4 0 PS 2 18 (SUTURE) ×3 IMPLANT
SYR BULB EAR ULCER 3OZ GRN STR (SYRINGE) ×3 IMPLANT
SYR CONTROL 10ML LL (SYRINGE) ×3 IMPLANT
TOWEL GREEN STERILE FF (TOWEL DISPOSABLE) ×6 IMPLANT
UNDERPAD 30X36 HEAVY ABSORB (UNDERPADS AND DIAPERS) ×3 IMPLANT

## 2020-06-02 NOTE — Anesthesia Postprocedure Evaluation (Signed)
Anesthesia Post Note  Patient: Kelly Buck  Procedure(s) Performed: LEFT CARPAL TUNNEL RELEASE (Left Hand)     Patient location during evaluation: PACU Anesthesia Type: Bier Block Level of consciousness: awake and alert Pain management: pain level controlled Vital Signs Assessment: post-procedure vital signs reviewed and stable Respiratory status: spontaneous breathing, nonlabored ventilation and respiratory function stable Cardiovascular status: blood pressure returned to baseline and stable Postop Assessment: no apparent nausea or vomiting Anesthetic complications: no   No complications documented.  Last Vitals:  Vitals:   06/02/20 1258 06/02/20 1317  BP: (!) 138/91 (!) 147/99  Pulse: 79 70  Resp: 13 14  Temp:  36.8 C  SpO2: 99% 99%    Last Pain:  Vitals:   06/02/20 1317  TempSrc:   PainSc: [place]

## 2020-06-02 NOTE — Transfer of Care (Signed)
Immediate Anesthesia Transfer of Care Note  Patient: Kelly Buck  Procedure(s) Performed: LEFT CARPAL TUNNEL RELEASE (Left Hand)  Patient Location: PACU  Anesthesia Type:MAC and Bier block  Level of Consciousness: awake, alert  and oriented  Airway & Oxygen Therapy: Patient Spontanous Breathing and Patient connected to face mask oxygen  Post-op Assessment: Report given to RN and Post -op Vital signs reviewed and stable  Post vital signs: Reviewed and stable  Last Vitals:  Vitals Value Taken Time  BP 156/98   Temp    Pulse 75 06/02/20 1227  Resp 17 06/02/20 1227  SpO2 100 % 06/02/20 1227  Vitals shown include unvalidated device data.  Last Pain:  Vitals:   06/02/20 1033  TempSrc: Oral  PainSc: 6       Patients Stated Pain Goal: 4 (64/31/42 7670)  Complications: No complications documented.

## 2020-06-02 NOTE — Anesthesia Preprocedure Evaluation (Addendum)
Anesthesia Evaluation  Patient identified by MRN, date of birth, ID band Patient awake    Reviewed: Allergy & Precautions, NPO status , Patient's Chart, lab work & pertinent test results  History of Anesthesia Complications (+) PONV and history of anesthetic complications  Airway Mallampati: I  TM Distance: >3 FB Neck ROM: Full    Dental  (+) Teeth Intact   Pulmonary former smoker,    Pulmonary exam normal breath sounds clear to auscultation       Cardiovascular Exercise Tolerance: Good negative cardio ROS Normal cardiovascular exam Rhythm:Regular Rate:Normal  Hx/o palpitations   Neuro/Psych PSYCHIATRIC DISORDERS Anxiety negative neurological ROS     GI/Hepatic negative GI ROS, Neg liver ROS,   Endo/Other  Morbid obesity  Renal/GU negative Renal ROS  negative genitourinary   Musculoskeletal  (+) Arthritis , Osteoarthritis,  DDD L5-S1 Left carpal Tunnel syndrome   Abdominal   Peds  Hematology negative hematology ROS (+)   Anesthesia Other Findings   Reproductive/Obstetrics Hx/o endometriosis                           Anesthesia Physical Anesthesia Plan  ASA: III  Anesthesia Plan: Bier Block and Bier Block-LIDOCAINE ONLY   Post-op Pain Management:    Induction: Intravenous  PONV Risk Score and Plan: 3 and Midazolam, Propofol infusion and Treatment may vary due to age or medical condition  Airway Management Planned: Natural Airway and Nasal Cannula  Additional Equipment:   Intra-op Plan:   Post-operative Plan:   Informed Consent: I have reviewed the patients History and Physical, chart, labs and discussed the procedure including the risks, benefits and alternatives for the proposed anesthesia with the patient or authorized representative who has indicated his/her understanding and acceptance.       Plan Discussed with: CRNA  Anesthesia Plan Comments:          Anesthesia Quick Evaluation

## 2020-06-02 NOTE — H&P (Signed)
Kelly Buck is an 39 y.o. female.   Chief Complaint: carpal tunnel syndrome HPI: 39 yo female with numbness and tingling left hand.  Nocturnal symptoms.  Positive nerve conduction studies.  She wishes to proceed with carpal tunnel release.  Allergies:  Allergies  Allergen Reactions  . No Known Allergies     Past Medical History:  Diagnosis Date  . Anxiety   . Complication of anesthesia   . Endometriosis   . Palpitations   . PONV (postoperative nausea and vomiting)   . Vaginal Pap smear, abnormal     Past Surgical History:  Procedure Laterality Date  . ABDOMINOPLASTY    . CARPAL TUNNEL RELEASE Right   . CERVICAL BIOPSY    . DILATION AND EVACUATION N/A 06/01/2014   Procedure: DILATATION AND EVACUATION;  Surgeon: [name], MD;  Location: [facility];  Service: Gynecology;  Laterality: N/A;  . DILATION AND EVACUATION N/A 06/04/2014   Procedure: DILATATION AND EVACUATION;  Surgeon: [name], MD;  Location: [facility];  Service: Gynecology;  Laterality: N/A;  . LUMBAR LAMINECTOMY/DECOMPRESSION MICRODISCECTOMY Right 10/31/2016   Procedure: RIGHT SIDED LUMBAR 5-SACRUM 1 MICRODISCECTOMY;  Surgeon: Phylliss Bob, MD;  Location: [facility];  Service: Orthopedics;  Laterality: Right;  RIGHT SIDED LUMBAR 5-SACRUM 1 MICRODISCECTOMY  . PLACEMENT OF BREAST IMPLANTS      Family History: Family History  Problem Relation Age of Onset  . Healthy Mother   . Carpal tunnel syndrome Mother   . Healthy Father   . Healthy Brother   . Healthy Brother   . Hodgkin's lymphoma Maternal Grandmother   . Lupus Paternal Grandmother   . Dementia Paternal Grandmother   . Fibromyalgia Paternal Grandmother     Social History:   reports that she has quit smoking. Her smoking use included cigarettes. She has a 1.00 pack-year smoking history. She has never used smokeless tobacco. She reports current alcohol use. She reports that she does not use drugs.  Medications: No medications prior to  admission.    Results for orders placed or performed during the hospital encounter of 05/31/20 (from the past 48 hour(s))  SARS CORONAVIRUS 2 (TAT 6-24 HRS) Nasopharyngeal Nasopharyngeal Swab     Status: None   Collection Time: 05/31/20  1:26 PM   Specimen: Nasopharyngeal Swab  Result Value Ref Range   SARS Coronavirus 2 NEGATIVE NEGATIVE    Comment: (NOTE) SARS-CoV-2 target nucleic acids are NOT DETECTED.  The SARS-CoV-2 RNA is generally detectable in upper and lower respiratory specimens during the acute phase of infection. Negative results do not preclude SARS-CoV-2 infection, do not rule out co-infections with other pathogens, and should not be used as the sole basis for treatment or other patient management decisions. Negative results must be combined with clinical observations, patient history, and epidemiological information. The expected result is Negative.  Fact Sheet for Patients: SugarRoll.be  Fact Sheet for Healthcare Providers: https://www.woods-mathews.com/  This test is not yet approved or cleared by the [place] FDA and  has been authorized for detection and/or diagnosis of SARS-CoV-2 by FDA under an Emergency Use Authorization (EUA). This EUA will remain  in effect (meaning this test can be used) for the duration of the COVID-19 declaration under Se ction 564(b)(1) of the Act, 21 U.S.C. section 360bbb-3(b)(1), unless the authorization is terminated or revoked sooner.  Performed at [facility], [place] [address] [place], [place] [address]     No results found.   A comprehensive review of systems  was negative.  Height 5\' 5"  (1.651 m), weight 104.3 kg, last menstrual period 05/17/2020, not currently breastfeeding.  General appearance: alert, cooperative and appears stated age Head: Normocephalic, without obvious abnormality, atraumatic Neck: supple, symmetrical, trachea midline Cardio: regular rate and  rhythm Resp: clear to auscultation bilaterally Extremities: Intact sensation and capillary refill all digits.  +epl/fpl/io.  No wounds. Pulses: 2+ and symmetric Skin: Skin color, texture, turgor normal. No rashes or lesions Neurologic: Grossly normal Incision/Wound: none  Assessment/Plan Left carpal tunnel release.  Non operative and operative treatment options have been discussed with the patient and patient wishes to proceed with operative treatment. Risks, benefits, and alternatives of surgery have been discussed and the patient agrees with the plan of care.   Leanora Cover 06/02/2020, 9:32 AM

## 2020-06-02 NOTE — Discharge Instructions (Addendum)
[facility] Instructions Hand Surgery  Wound Care: Keep your hand elevated above the level of your heart.  Do not allow it to dangle by your side.  Keep the dressing dry and do not remove it unless your doctor advises you to do so.  He will usually change it at the time of your post-op visit.  Moving your fingers is advised to stimulate circulation but will depend on the site of your surgery.  If you have a splint applied, your doctor will advise you regarding movement.  Activity: Do not drive or operate machinery today.  Rest today and then you may return to your normal activity and work as indicated by your physician.  Diet:  Drink liquids today or eat a light diet.  You may resume a regular diet tomorrow.    General expectations: Pain for two to three days. Fingers may become slightly swollen.  Call your doctor if any of the following occur: Severe pain not relieved by pain medication. Elevated temperature. Dressing soaked with blood. Inability to move fingers. White or bluish color to fingers.    No Tylenol until 4:48 pm   Post Anesthesia Home Care Instructions  Activity: Get plenty of rest for the remainder of the day. A responsible individual must stay with you for 24 hours following the procedure.  For the next 24 hours, DO NOT: -Drive a car -Paediatric nurse -Drink alcoholic beverages -Take any medication unless instructed by your physician -Make any legal decisions or sign important papers.  Meals: Start with liquid foods such as gelatin or soup. Progress to regular foods as tolerated. Avoid greasy, spicy, heavy foods. If nausea and/or vomiting occur, drink only clear liquids until the nausea and/or vomiting subsides. Call your physician if vomiting continues.  Special Instructions/Symptoms: Your throat may feel dry or sore from the anesthesia or the breathing tube placed in your throat during surgery. If this causes discomfort, gargle with warm salt water. The  discomfort should disappear within 24 hours.  If you had a scopolamine patch placed behind your ear for the management of post- operative nausea and/or vomiting:  1. The medication in the patch is effective for 72 hours, after which it should be removed.  Wrap patch in a tissue and discard in the trash. Wash hands thoroughly with soap and water. 2. You may remove the patch earlier than 72 hours if you experience unpleasant side effects which may include dry mouth, dizziness or visual disturbances. 3. Avoid touching the patch. Wash your hands with soap and water after contact with the patch.

## 2020-06-02 NOTE — Anesthesia Procedure Notes (Signed)
Anesthesia Regional Block: Bier block (IV Regional)   Pre-Anesthetic Checklist: ,, timeout performed, Correct Patient, Correct Site, Correct Laterality, Correct Procedure, Correct Position, site marked, Risks and benefits discussed,  Surgical consent,  Pre-op evaluation,  At surgeon's request and post-op pain management  Laterality: Left  Prep: chloraprep       Needles:  Injection technique: Single-shot      Needle Gauge: 22     Additional Needles:   Procedures:,,,,, intact distal pulses, Esmarch exsanguination, single tourniquet utilized, #20gu IV placed  Narrative:  Start time: 06/02/2020 11:51 AM End time: 06/02/2020 11:53 AM  Performed by: Personally

## 2020-06-02 NOTE — Op Note (Signed)
06/02/2020 [facility]                              OPERATIVE REPORT   PREOPERATIVE DIAGNOSIS:  Left carpal tunnel syndrome.  POSTOPERATIVE DIAGNOSIS:  Left carpal tunnel syndrome.  PROCEDURE:  Left carpal tunnel release.  SURGEON:  Leanora Cover, MD  ASSISTANT:  none.  ANESTHESIA: Bier block with sedation  IV FLUIDS:  Per anesthesia flow sheet.  ESTIMATED BLOOD LOSS:  Minimal.  COMPLICATIONS:  None.  SPECIMENS:  None.  TOURNIQUET TIME:    Total Tourniquet Time Documented: Forearm (Left) - 28 minutes Total: Forearm (Left) - 28 minutes   DISPOSITION:  Stable to PACU.  LOCATION: [facility]  INDICATIONS:  39 yo female with numbness and tingling left hand.  Nocturnal symptoms.  Positive nerve conduction studies.  She wishes to have a carpal tunnel release for management of her symptoms.  Risks, benefits and alternatives of surgery were discussed including the risk of blood loss; infection; damage to nerves, vessels, tendons, ligaments, bone; failure of surgery; need for additional surgery; complications with wound healing; continued pain; recurrence of carpal tunnel syndrome; and damage to motor branch. She voiced understanding of these risks and elected to proceed.   OPERATIVE COURSE:  After being identified preoperatively by myself, the patient and I agreed upon the procedure and site of procedure.  The surgical site was marked.  The risks, benefits, and alternatives of the surgery were reviewed and she wished to proceed.  Surgical consent had been signed.  She was given IV Ancef as preoperative antibiotic prophylaxis.  She was transferred to the operating room and placed on the operating room table in supine position with the Left upper extremity on an armboard.  Bier block anesthesia was induced by the anesthesiologist.  Left upper extremity was prepped and draped in normal sterile orthopaedic fashion.  A surgical pause was performed between the  surgeons, anesthesia, and operating room staff, and all were in agreement as to the patient, procedure, and site of procedure.  Tourniquet at the proximal aspect of the forearm had been inflated for the Bier block  Incision was made over the transverse carpal ligament and carried into the subcutaneous tissues by spreading technique.  Bipolar electrocautery was used to obtain hemostasis.  The palmar fascia was sharply incised.  The transverse carpal ligament was identified and sharply incised.  It was incised distally first.  The flexor tendons were identified.  The flexor tendon to the ring finger was identified and retracted radially.  The transverse carpal ligament was then incised proximally.  Scissors were used to split the distal aspect of the volar antebrachial fascia.  A finger was placed into the wound to ensure complete decompression, which was the case.  The nerve was examined.  It was flattened and hyperemic.  The motor branch was identified and was intact.  The wound was copiously irrigated with sterile saline.  It was then closed with 4-0 nylon in a horizontal mattress fashion.  It was injected with 0.25% plain Marcaine to aid in postoperative analgesia.  It was dressed with sterile Xeroform, 4x4s, an ABD, and wrapped with Kerlix and an Ace bandage.  Tourniquet was deflated at 28 minutes.  Fingertips were pink with brisk capillary refill after deflation of the tourniquet.  Operative drapes were broken down.  The patient was awoken from anesthesia safely.  She was transferred back to stretcher and taken to  the PACU in stable condition.  I will see her back in the office in 1 week for postoperative followup.  I will give her a prescription for Norco 5/325 1-2 tabs PO q6 hours prn pain, dispense # 20.    Leanora Cover, MD Electronically signed, 06/02/20

## 2020-06-03 ENCOUNTER — Encounter (HOSPITAL_BASED_OUTPATIENT_CLINIC_OR_DEPARTMENT_OTHER): Payer: Self-pay | Admitting: Orthopedic Surgery

## 2020-11-13 ENCOUNTER — Encounter: Payer: Self-pay | Admitting: Emergency Medicine

## 2020-11-13 ENCOUNTER — Other Ambulatory Visit: Payer: Self-pay

## 2020-11-13 ENCOUNTER — Ambulatory Visit
Admission: EM | Admit: 2020-11-13 | Discharge: 2020-11-13 | Disposition: A | Discharge status: Home / Self Care | Payer: No Typology Code available for payment source | Attending: Urgent Care | Admitting: Urgent Care

## 2020-11-13 DIAGNOSIS — R300 Dysuria: Secondary | ICD-10-CM

## 2020-11-13 DIAGNOSIS — N10 Acute tubulo-interstitial nephritis: Secondary | ICD-10-CM | POA: Diagnosis present

## 2020-11-13 LAB — POCT URINALYSIS DIP (MANUAL ENTRY)
Bilirubin, UA: NEGATIVE
Glucose, UA: NEGATIVE mg/dL
Ketones, POC UA: NEGATIVE mg/dL
Nitrite, UA: NEGATIVE
Protein Ur, POC: NEGATIVE mg/dL
Spec Grav, UA: 1.015 (ref 1.010–1.025)
Urobilinogen, UA: 0.2 E.U./dL
pH, UA: 7 (ref 5.0–8.0)

## 2020-11-13 LAB — POCT URINE PREGNANCY: Preg Test, Ur: NEGATIVE

## 2020-11-13 MED ORDER — CEFTRIAXONE SODIUM 1 G IJ SOLR
1.0000 g | Freq: Once | INTRAMUSCULAR | Status: AC
  Administered 2020-11-13: 1 g via INTRAMUSCULAR

## 2020-11-13 MED ORDER — CEPHALEXIN 500 MG PO CAPS
500.0000 mg | ORAL_CAPSULE | Freq: Two times a day (BID) | ORAL | 0 refills | Status: DC
Start: 2020-11-13 — End: 2022-05-23

## 2020-11-13 NOTE — ED Provider Notes (Signed)
[place]   MRN: [id number] DOB: 1981/03/25  Subjective:   Kelly Buck is a 40 y.o. female presenting for history of 1 week persistent dysuria, urinary frequency and urgency.  Patient has been trying to hydrate very well.  Has felt more abdominal pain and flank pain lately.  Also has a history of a spinal cord stimulator in her lumbar region of the right side.  Patient drinks about 4 to 6 cups of coffee a day.  Tries to also hydrate throughout the day with water.  No current facility-administered medications for this encounter.  Current Outpatient Medications:  .  HYDROcodone-acetaminophen (NORCO) 5-325 MG tablet, 1-2 tabs po q6 hours prn pain (Patient not taking: Reported on 11/13/2020), Disp: 20 tablet, Rfl: 0 .  imipramine (TOFRANIL-PM) 125 MG capsule, Take 1 capsule by mouth daily., Disp: , Rfl: 5 .  meloxicam (MOBIC) 15 MG tablet, Take 15 mg by mouth daily., Disp: , Rfl:  .  pregabalin (LYRICA) 75 MG capsule, Take 75 mg by mouth 2 (two) times daily., Disp: , Rfl:    Allergies  Allergen Reactions  . No Known Allergies     Past Medical History:  Diagnosis Date  . Anxiety   . Complication of anesthesia   . Endometriosis   . Palpitations   . PONV (postoperative nausea and vomiting)   . Vaginal Pap smear, abnormal      Past Surgical History:  Procedure Laterality Date  . ABDOMINOPLASTY    . CARPAL TUNNEL RELEASE Right   . CARPAL TUNNEL RELEASE Left 06/02/2020   Procedure: LEFT CARPAL TUNNEL RELEASE;  Surgeon: Leanora Cover, MD;  Location: [place];  Service: Orthopedics;  Laterality: Left;  Bier block  . CERVICAL BIOPSY    . DILATION AND EVACUATION N/A 06/01/2014   Procedure: DILATATION AND EVACUATION;  Surgeon: [name], MD;  Location: [facility];  Service: Gynecology;  Laterality: N/A;  . DILATION AND EVACUATION N/A 06/04/2014   Procedure: DILATATION AND EVACUATION;  Surgeon: [name], MD;  Location: [facility];  Service:  Gynecology;  Laterality: N/A;  . LUMBAR LAMINECTOMY/DECOMPRESSION MICRODISCECTOMY Right 10/31/2016   Procedure: RIGHT SIDED LUMBAR 5-SACRUM 1 MICRODISCECTOMY;  Surgeon: Phylliss Bob, MD;  Location: [place];  Service: Orthopedics;  Laterality: Right;  RIGHT SIDED LUMBAR 5-SACRUM 1 MICRODISCECTOMY  . PLACEMENT OF BREAST IMPLANTS      Family History  Problem Relation Age of Onset  . Healthy Mother   . Carpal tunnel syndrome Mother   . Healthy Father   . Healthy Brother   . Healthy Brother   . Hodgkin's lymphoma Maternal Grandmother   . Lupus Paternal Grandmother   . Dementia Paternal Grandmother   . Fibromyalgia Paternal Grandmother     Social History   Tobacco Use  . Smoking status: Former Smoker    Packs/day: 0.25    Years: 4.00    Pack years: 1.00    Types: Cigarettes  . Smokeless tobacco: Never Used  Vaping Use  . Vaping Use: Never used  Substance Use Topics  . Alcohol use: Yes    Comment: 2/wk  . Drug use: No    ROS   Objective:   Vitals: BP (!) 154/91 (BP Location: Left Arm)   Pulse (!) 106   Temp 98.1 F (36.7 C) (Oral)   Resp 18   SpO2 95%   Physical Exam Constitutional:      General: She is not in acute distress.  Appearance: Normal appearance. She is well-developed. She is obese. She is ill-appearing. She is not toxic-appearing or diaphoretic.  HENT:     Head: Normocephalic and atraumatic.     Nose: Nose normal.     Mouth/Throat:     Mouth: Mucous membranes are moist.     Pharynx: Oropharynx is clear.  Eyes:     General: No scleral icterus.    Extraocular Movements: Extraocular movements intact.     Pupils: Pupils are equal, round, and reactive to light.  Cardiovascular:     Rate and Rhythm: Normal rate.  Pulmonary:     Effort: Pulmonary effort is normal.  Abdominal:     General: There is no distension.     Palpations: There is no mass.     Tenderness: There is abdominal tenderness. There is left CVA tenderness. There is no right CVA  tenderness, guarding or rebound.  Skin:    General: Skin is warm and dry.  Neurological:     General: No focal deficit present.     Mental Status: She is alert and oriented to person, place, and time.  Psychiatric:        Mood and Affect: Mood normal.        Behavior: Behavior normal.        Thought Content: Thought content normal.        Judgment: Judgment normal.     Results for orders placed or performed during the hospital encounter of 11/13/20 (from the past 24 hour(s))  POCT urinalysis dipstick     Status: Abnormal   Collection Time: 11/13/20 12:33 PM  Result Value Ref Range   Color, UA yellow yellow   Clarity, UA cloudy (A) clear   Glucose, UA negative negative mg/dL   Bilirubin, UA negative negative   Ketones, POC UA negative negative mg/dL   Spec Grav, UA 1.015 1.010 - 1.025   Blood, UA large (A) negative   pH, UA 7.0 5.0 - 8.0   Protein Ur, POC negative negative mg/dL   Urobilinogen, UA 0.2 0.2 or 1.0 E.U./dL   Nitrite, UA Negative Negative   Leukocytes, UA Large (3+) (A) Negative  POCT urine pregnancy     Status: None   Collection Time: 11/13/20 12:33 PM  Result Value Ref Range   Preg Test, Ur Negative Negative    Assessment and Plan :   PDMP not reviewed this encounter.  1. Acute pyelonephritis   2. Dysuria     Will cover for pyelonephritis given physical exam findings, IM ceftriaxone in clinic.  Urine culture pending.  Hydrate well.  Keflex at home. Counseled patient on potential for adverse effects with medications prescribed/recommended today, ER and return-to-clinic precautions discussed, patient verbalized understanding.    Jaynee Eagles, PA-C 11/13/20 1257

## 2020-11-13 NOTE — ED Triage Notes (Signed)
Pt here for dysuria and frequency; pt unsure if her bladder is full at present; pt does have a hx of spinal cord stimulator in back

## 2020-11-13 NOTE — Discharge Instructions (Addendum)

## 2020-11-14 LAB — URINE CULTURE: Culture: NO GROWTH

## 2020-12-05 ENCOUNTER — Other Ambulatory Visit: Payer: Self-pay

## 2020-12-05 ENCOUNTER — Emergency Department (HOSPITAL_COMMUNITY)
Admission: EM | Admit: 2020-12-05 | Discharge: 2020-12-06 | Disposition: A | Discharge status: Home / Self Care | Payer: No Typology Code available for payment source | Attending: Emergency Medicine | Admitting: Emergency Medicine

## 2020-12-05 ENCOUNTER — Encounter (HOSPITAL_COMMUNITY): Payer: Self-pay | Admitting: Emergency Medicine

## 2020-12-05 ENCOUNTER — Emergency Department (HOSPITAL_COMMUNITY): Payer: No Typology Code available for payment source

## 2020-12-05 DIAGNOSIS — R091 Pleurisy: Secondary | ICD-10-CM | POA: Insufficient documentation

## 2020-12-05 DIAGNOSIS — Z87891 Personal history of nicotine dependence: Secondary | ICD-10-CM | POA: Diagnosis not present

## 2020-12-05 DIAGNOSIS — R0989 Other specified symptoms and signs involving the circulatory and respiratory systems: Secondary | ICD-10-CM | POA: Diagnosis present

## 2020-12-05 DIAGNOSIS — G4489 Other headache syndrome: Secondary | ICD-10-CM | POA: Diagnosis not present

## 2020-12-05 DIAGNOSIS — R072 Precordial pain: Secondary | ICD-10-CM

## 2020-12-05 LAB — CBC
HCT: 44.7 % (ref 36.0–46.0)
Hemoglobin: 14.7 g/dL (ref 12.0–15.0)
MCH: 29.9 pg (ref 26.0–34.0)
MCHC: 32.9 g/dL (ref 30.0–36.0)
MCV: 91 fL (ref 80.0–100.0)
Platelets: 494 10*3/uL — ABNORMAL HIGH (ref 150–400)
RBC: 4.91 MIL/uL (ref 3.87–5.11)
RDW: 12.3 % (ref 11.5–15.5)
WBC: 11.4 10*3/uL — ABNORMAL HIGH (ref 4.0–10.5)
nRBC: 0 % (ref 0.0–0.2)

## 2020-12-05 LAB — BASIC METABOLIC PANEL
Anion gap: 15 (ref 5–15)
BUN: 8 mg/dL (ref 6–20)
CO2: 20 mmol/L — ABNORMAL LOW (ref 22–32)
Calcium: 9.2 mg/dL (ref 8.9–10.3)
Chloride: 103 mmol/L (ref 98–111)
Creatinine, Ser: 0.83 mg/dL (ref 0.44–1.00)
GFR, Estimated: >60 mL/min (ref >60)
Glucose, Bld: 116 mg/dL — ABNORMAL HIGH (ref 70–99)
Potassium: 3.6 mmol/L (ref 3.5–5.1)
Sodium: 138 mmol/L (ref 135–145)

## 2020-12-05 LAB — TROPONIN I (HIGH SENSITIVITY)
Troponin I (High Sensitivity): 4 ng/L (ref <18)
Troponin I (High Sensitivity): 4 ng/L (ref <18)

## 2020-12-05 LAB — I-STAT BETA HCG BLOOD, ED (MC, WL, AP ONLY): I-stat hCG, quantitative: <5 m[IU]/mL (ref <5)

## 2020-12-05 MED ORDER — ACETAMINOPHEN 325 MG PO TABS
650.0000 mg | ORAL_TABLET | Freq: Once | ORAL | Status: AC
  Administered 2020-12-05: 650 mg via ORAL
  Filled 2020-12-05: qty 2

## 2020-12-05 MED ORDER — NITROGLYCERIN 0.4 MG SL SUBL
0.4000 mg | SUBLINGUAL_TABLET | SUBLINGUAL | Status: DC | PRN
  Administered 2020-12-06: 0.4 mg via SUBLINGUAL
  Filled 2020-12-05: qty 1

## 2020-12-05 NOTE — ED Provider Notes (Signed)
[facility] EMERGENCY DEPARTMENT Provider Note   CSN: [id number] Arrival date & time: 12/05/20  2002     History Chief Complaint  Patient presents with   Chest Pain    Kelly Buck is a 40 y.o. female.  HPI  HPI: A 40 year old patient with a history of hypertension presents for evaluation of chest pain. Initial onset of pain was approximately 3-6 hours ago. The patient's chest pain is described as heaviness/pressure/tightness, is not worse with exertion and is relieved by nitroglycerin. The patient's chest pain is middle- or left-sided, is not well-localized, is not sharp and does not radiate to the arms/jaw/neck. The patient does not complain of nausea and denies diaphoresis. The patient has a family history of coronary artery disease in a first-degree relative with onset less than age 77. The patient has no history of stroke, has no history of peripheral artery disease, has not smoked in the past 90 days, denies any history of treated diabetes, has no history of hypercholesterolemia and does not have an elevated BMI (>=30).   Patient with history of anxiety, palpitations, endometriosis presents with headache and chest pain Patient reports over 2 days ago she woke up with a "migraine" she reports she does not typically get headaches.  This persisted for over 2 days.  Denies any focal weakness.  No slurred speech.  No fevers or vomiting. She reports it starts in the occipital region and has pulsing in her ears at times.  No dizziness or vertigo.  She was advised by her PCP to go to an urgent care.  The urgent care told her to go to the ER.  Patient went to [facility] regional ER and left prior to final evaluation as she was concerned she was going to catch Covid. She went home and began having right-sided chest pain that radiates to her left chest and her family called 51.  She was given aspirin and Tylenol and her symptoms are improving.  No history of  CAD/VTE. She reports her grandfather died at the age of 74 of an MI. She is also concerned about her elevated blood pressure over the past several days as she does not have known hypertension Patient is a non-smoker.  She does not take contraceptives Past Medical History:  Diagnosis Date   Anxiety    Complication of anesthesia    Endometriosis    Palpitations    PONV (postoperative nausea and vomiting)    Vaginal Pap smear, abnormal     Patient Active Problem List   Diagnosis Date Noted   Degenerative disc disease at L5-S1 level 07/25/2017   Postpartum endometritis 06/04/2014   Retained products of conception after delivery without hemorrhage 06/01/2014   Postpartum state 05/21/2014   Oligohydramnios 05/20/2014   Antepartum non-reassuring fetal heart rate or rhythm affecting care of mother 04/28/2014    Past Surgical History:  Procedure Laterality Date   ABDOMINOPLASTY     CARPAL TUNNEL RELEASE Right    CARPAL TUNNEL RELEASE Left 06/02/2020   Procedure: LEFT CARPAL TUNNEL RELEASE;  Surgeon: Leanora Cover, MD;  Location: [place];  Service: Orthopedics;  Laterality: Left;  Bier block   CERVICAL BIOPSY     DILATION AND EVACUATION N/A 06/01/2014   Procedure: DILATATION AND EVACUATION;  Surgeon: [name], MD;  Location: [facility];  Service: Gynecology;  Laterality: N/A;   DILATION AND EVACUATION N/A 06/04/2014   Procedure: DILATATION AND EVACUATION;  Surgeon: [name], MD;  Location:  [facility];  Service: Gynecology;  Laterality: N/A;   LUMBAR LAMINECTOMY/DECOMPRESSION MICRODISCECTOMY Right 10/31/2016   Procedure: RIGHT SIDED LUMBAR 5-SACRUM 1 MICRODISCECTOMY;  Surgeon: Phylliss Bob, MD;  Location: [place];  Service: Orthopedics;  Laterality: Right;  RIGHT SIDED LUMBAR 5-SACRUM 1 MICRODISCECTOMY   PLACEMENT OF BREAST IMPLANTS       OB History    Gravida  5   Para  4   Term  4   Preterm      AB  1   Living  4     SAB  1    IAB      Ectopic      Multiple      Live Births  4           Family History  Problem Relation Age of Onset   Healthy Mother    Carpal tunnel syndrome Mother    Healthy Father    Healthy Brother    Healthy Brother    Hodgkin's lymphoma Maternal Grandmother    Lupus Paternal Grandmother    Dementia Paternal Grandmother    Fibromyalgia Paternal Grandmother     Social History   Tobacco Use   Smoking status: Former Smoker    Packs/day: 0.25    Years: 4.00    Pack years: 1.00    Types: Cigarettes   Smokeless tobacco: Never Used  Scientific laboratory technician Use: Never used  Substance Use Topics   Alcohol use: Yes    Comment: 2/wk   Drug use: No    Home Medications Prior to Admission medications   Medication Sig Start Date End Date Taking? Authorizing Provider  cephALEXin (KEFLEX) 500 MG capsule Take 1 capsule (500 mg total) by mouth 2 (two) times daily. 11/13/20   Jaynee Eagles, PA-C  HYDROcodone-acetaminophen [facility]) 5-325 MG tablet 1-2 tabs po q6 hours prn pain Patient not taking: Reported on 11/13/2020 06/02/20   Leanora Cover, MD  imipramine (TOFRANIL-PM) 125 MG capsule Take 1 capsule by mouth daily. 08/18/18   [provider]  meloxicam (MOBIC) 15 MG tablet Take 15 mg by mouth daily.    [provider]  pregabalin (LYRICA) 75 MG capsule Take 75 mg by mouth 2 (two) times daily.    [provider]    Allergies    No known allergies  Review of Systems   Review of Systems  Constitutional: Negative for diaphoresis and fever.  Eyes: Negative for visual disturbance.  Respiratory: Positive for shortness of breath.   Cardiovascular: Positive for chest pain.  Gastrointestinal: Negative for vomiting.  Neurological: Positive for headaches. Negative for dizziness, syncope, speech difficulty, weakness and numbness.  All other systems reviewed and are negative.   Physical Exam Updated Vital Signs BP 120/88    Pulse 74    Temp 98.2 F  (36.8 C) (Oral)    Resp 18    Ht 1.651 m (5\' 5" ) Comment: Simultaneous filing. User may not have seen previous data.   Wt 110 kg Comment: Simultaneous filing. User may not have seen previous data.   LMP 11/21/2020    SpO2 99%    BMI 40.36 kg/m   Physical Exam CONSTITUTIONAL: Well developed/well nourished HEAD: Normocephalic/atraumatic EYES: EOMI/PERRL, no nystagmus, no visual field deficit  no ptosis ENMT: Mucous membranes moist NECK: supple no meningeal signs, no bruits CV: S1/S2 noted, no murmurs/rubs/gallops noted LUNGS: Lungs are clear to auscultation bilaterally, no apparent distress ABDOMEN: soft, nontender, no rebound or guarding GU:no cva tenderness NEURO:Awake/alert, face  symmetric, no arm or leg drift is noted Equal 5/5 strength with shoulder abduction, elbow flex/extension, wrist flex/extension in upper extremities and equal hand grips bilaterally Equal 5/5 strength with hip flexion,knee flex/extension, foot dorsi/plantar flexion Cranial nerves 3/4/5/6/04/29/09/11/12 tested and intact Gait normal without ataxia No past pointing Sensation to light touch intact in all extremities EXTREMITIES: pulses normalx4, full ROM SKIN: warm, color normal PSYCH: no abnormalities of mood noted  ED Results / Procedures / Treatments   Labs (all labs ordered are listed, but only abnormal results are displayed) Labs Reviewed  BASIC METABOLIC PANEL - Abnormal; Notable for the following components:      Result Value   CO2 20 (*)    Glucose, Bld 116 (*)    All other components within normal limits  CBC - Abnormal; Notable for the following components:   WBC 11.4 (*)    Platelets 494 (*)    All other components within normal limits  D-DIMER, QUANTITATIVE (NOT AT [facility]) - Abnormal; Notable for the following components:   D-Dimer, Quant 2.43 (*)    All other components within normal limits  I-STAT BETA HCG BLOOD, ED ([facility], [facility], [facility] ONLY)  TROPONIN I (HIGH SENSITIVITY)  TROPONIN I (HIGH  SENSITIVITY)    EKG EKG Interpretation  Date/Time:  Monday December 05 2020 20:16:36 EST Ventricular Rate:  97 PR Interval:  126 QRS Duration: 74 QT Interval:  388 QTC Calculation: 492 R Axis:   -4 Text Interpretation: Normal sinus rhythm Normal ECG  oither than Nonspecific T wave abnormality Confirmed by Noemi Chapel [phone]) on 12/05/2020 9:27:50 PM   Radiology DG Chest 2 View  Result Date: 12/05/2020 CLINICAL DATA:  Chest pain, shortness of breath EXAM: CHEST - 2 VIEW COMPARISON:  12/10/2017 FINDINGS: The heart size and mediastinal contours are within normal limits. No focal airspace consolidation, pleural effusion, or pneumothorax. The visualized skeletal structures are unremarkable. Spinal stimulator leads terminate within the lower thoracic region. IMPRESSION: No active cardiopulmonary disease. Electronically Signed   By: Davina Poke D.O.   On: 12/05/2020 20:54   CT Angio Chest PE W and/or Wo Contrast  Result Date: 12/06/2020 CLINICAL DATA:  Chest tightness and shortness of breath. EXAM: CT ANGIOGRAPHY CHEST WITH CONTRAST TECHNIQUE: Multidetector CT imaging of the chest was performed using the standard protocol during bolus administration of intravenous contrast. Multiplanar CT image reconstructions and MIPs were obtained to evaluate the vascular anatomy. CONTRAST:  20mL OMNIPAQUE IOHEXOL 350 MG/ML SOLN COMPARISON:  None. FINDINGS: Cardiovascular: Contrast injection is sufficient to demonstrate satisfactory opacification of the pulmonary arteries to the segmental level. There is no pulmonary embolus or evidence of right heart strain. The size of the main pulmonary artery is normal. Heart size is normal, with no pericardial effusion. The course and caliber of the aorta are normal. There is no atherosclerotic calcification. Opacification decreased due to pulmonary arterial phase contrast bolus timing. Mediastinum/Nodes: No mediastinal, hilar or axillary lymphadenopathy. Normal  visualized thyroid. Thoracic esophageal course is normal. Lungs/Pleura: Airways are patent. No pleural effusion, lobar consolidation, pneumothorax or pulmonary infarction. Upper Abdomen: Contrast bolus timing is not optimized for evaluation of the abdominal organs. The visualized portions of the organs of the upper abdomen are normal. Musculoskeletal: No chest wall abnormality. No bony spinal canal stenosis. Review of the MIP images confirms the above findings. IMPRESSION: No pulmonary embolus or other acute thoracic abnormality. Electronically Signed   By: Ulyses Jarred M.D.   On: 12/06/2020 02:28    Procedures Procedures   Medications  Ordered in ED Medications  nitroGLYCERIN (NITROSTAT) SL tablet 0.4 mg (0.4 mg Sublingual Given 12/06/20 0010)  acetaminophen (TYLENOL) tablet 650 mg (650 mg Oral Given 12/05/20 2018)  fentaNYL (SUBLIMAZE) injection 100 mcg (100 mcg Intravenous Given 12/06/20 0107)  iohexol (OMNIPAQUE) 350 MG/ML injection 80 mL (80 mLs Intravenous Contrast Given 12/06/20 0151)  ketorolac (TORADOL) 30 MG/ML injection 30 mg (30 mg Intravenous Given 12/06/20 0247)    ED Course  I have reviewed the triage vital signs and the nursing notes.  Pertinent labs & imaging results that were available during my care of the patient were reviewed by me and considered in my medical decision making (see chart for details).    MDM Rules/Calculators/A&P HEAR Score: 3                        1:43 AM Patient reported that her initial pain was chest pressure and tightness.  After receiving nitroglycerin, she began having sharp pleuritic type pain.  She appears low risk for VTE, therefore D-dimer was added on.  D-dimer was elevated, will proceed with CT chest 3:03 AM Overall patient appears improved.  Blood pressure is trending in the right direction.  CT chest negative for PE.  At this point she is appropriate discharge home.  Her headache appears to be resolving there is no neuro deficits to suggest  acute neurologic emergency.  PE  has been ruled out, low suspicion for ACS/dissection at this time.  Advise close follow with PCP for blood pressure check    This patient presents to the ED for concern of headache and chest pain, this involves an extensive number of treatment options, and is a complaint that carries with it a high risk of complications and morbidity.  The differential diagnosis includes unstable angina, pulmonary embolism, pneumonia, aortic dissection, stroke, subarachnoid hemorrhage, meningitis   Lab Tests:   I Ordered, reviewed, and interpreted labs, which included such electrolytes, CBC, troponin  Medicines ordered:   I ordered medication nitroglycerin for chest pressure  Denies medications for headache  Imaging Studies ordered:   I ordered imaging studies which included chest x-ray and CT PE  I independently visualized and interpreted imaging which showed no acute findings  Additional history obtained:    Previous records obtained and reviewed    Reevaluation:  After the interventions stated above, I reevaluated the patient and found patient is improved   Final Clinical Impression(s) / ED Diagnoses Final diagnoses:  Other headache syndrome  Precordial pain  Pleurisy    Rx / DC Orders ED Discharge Orders    None       Ripley Fraise, MD 12/06/20 [phone]

## 2020-12-05 NOTE — ED Triage Notes (Signed)
Patient arrived with EMS from home reports left chest " tightness/pressure" this afternoon with SOB , no emesis or diaphoresis , denies cough or fever . Patient added headache for 3 days .

## 2020-12-06 ENCOUNTER — Emergency Department (HOSPITAL_COMMUNITY): Payer: No Typology Code available for payment source

## 2020-12-06 DIAGNOSIS — R091 Pleurisy: Secondary | ICD-10-CM | POA: Diagnosis not present

## 2020-12-06 LAB — D-DIMER, QUANTITATIVE: D-Dimer, Quant: 2.43 ug/mL-FEU — ABNORMAL HIGH (ref 0.00–0.50)

## 2020-12-06 MED ORDER — KETOROLAC TROMETHAMINE 30 MG/ML IJ SOLN
30.0000 mg | Freq: Once | INTRAMUSCULAR | Status: AC
  Administered 2020-12-06: 30 mg via INTRAVENOUS
  Filled 2020-12-06: qty 1

## 2020-12-06 MED ORDER — FENTANYL CITRATE (PF) 100 MCG/2ML IJ SOLN
100.0000 ug | Freq: Once | INTRAMUSCULAR | Status: AC
  Administered 2020-12-06: 100 ug via INTRAVENOUS
  Filled 2020-12-06: qty 2

## 2020-12-06 MED ORDER — IOHEXOL 350 MG/ML SOLN
80.0000 mL | Freq: Once | INTRAVENOUS | Status: AC | PRN
  Administered 2020-12-06: 80 mL via INTRAVENOUS

## 2020-12-06 NOTE — ED Notes (Signed)
Patient transported to CT 

## 2020-12-06 NOTE — Discharge Instructions (Addendum)

## 2021-05-19 ENCOUNTER — Other Ambulatory Visit (HOSPITAL_COMMUNITY): Payer: Self-pay | Admitting: Orthopaedic Surgery

## 2021-05-19 ENCOUNTER — Ambulatory Visit (HOSPITAL_COMMUNITY)
Admission: RE | Admit: 2021-05-19 | Discharge: 2021-05-19 | Disposition: A | Discharge status: Home / Self Care | Payer: No Typology Code available for payment source | Source: Ambulatory Visit | Attending: Orthopaedic Surgery | Admitting: Orthopaedic Surgery

## 2021-05-19 ENCOUNTER — Other Ambulatory Visit: Payer: Self-pay

## 2021-05-19 DIAGNOSIS — M79604 Pain in right leg: Secondary | ICD-10-CM | POA: Insufficient documentation

## 2021-05-19 DIAGNOSIS — M7989 Other specified soft tissue disorders: Secondary | ICD-10-CM | POA: Diagnosis not present

## 2021-05-19 NOTE — Progress Notes (Signed)
Lower extremity venous has been completed.   Preliminary results in CV Proc.   Abram Sander 05/19/2021 3:36 PM

## 2022-05-17 ENCOUNTER — Emergency Department (HOSPITAL_BASED_OUTPATIENT_CLINIC_OR_DEPARTMENT_OTHER): Payer: Medicaid Other

## 2022-05-17 ENCOUNTER — Emergency Department (HOSPITAL_COMMUNITY): Payer: Medicaid Other

## 2022-05-17 ENCOUNTER — Other Ambulatory Visit: Payer: Self-pay

## 2022-05-17 ENCOUNTER — Emergency Department (HOSPITAL_BASED_OUTPATIENT_CLINIC_OR_DEPARTMENT_OTHER)
Admission: EM | Admit: 2022-05-17 | Discharge: 2022-05-18 | Disposition: A | Discharge status: Home / Self Care | Payer: Medicaid Other | Attending: Emergency Medicine | Admitting: Emergency Medicine

## 2022-05-17 ENCOUNTER — Encounter (HOSPITAL_BASED_OUTPATIENT_CLINIC_OR_DEPARTMENT_OTHER): Payer: Self-pay

## 2022-05-17 DIAGNOSIS — R519 Headache, unspecified: Secondary | ICD-10-CM | POA: Insufficient documentation

## 2022-05-17 DIAGNOSIS — H538 Other visual disturbances: Secondary | ICD-10-CM | POA: Diagnosis not present

## 2022-05-17 DIAGNOSIS — I639 Cerebral infarction, unspecified: Secondary | ICD-10-CM | POA: Diagnosis not present

## 2022-05-17 DIAGNOSIS — R42 Dizziness and giddiness: Secondary | ICD-10-CM | POA: Insufficient documentation

## 2022-05-17 DIAGNOSIS — Z79899 Other long term (current) drug therapy: Secondary | ICD-10-CM | POA: Diagnosis not present

## 2022-05-17 LAB — DIFFERENTIAL
Abs Immature Granulocytes: 0.02 10*3/uL (ref 0.00–0.07)
Basophils Absolute: 0 10*3/uL (ref 0.0–0.1)
Basophils Relative: 0 %
Eosinophils Absolute: 0.4 10*3/uL (ref 0.0–0.5)
Eosinophils Relative: 4 %
Immature Granulocytes: 0 %
Lymphocytes Relative: 40 %
Lymphs Abs: 3.4 10*3/uL (ref 0.7–4.0)
Monocytes Absolute: 0.5 10*3/uL (ref 0.1–1.0)
Monocytes Relative: 6 %
Neutro Abs: 4.2 10*3/uL (ref 1.7–7.7)
Neutrophils Relative %: 50 %

## 2022-05-17 LAB — COMPREHENSIVE METABOLIC PANEL
ALT: 16 U/L (ref 0–44)
AST: 19 U/L (ref 15–41)
Albumin: 4.5 g/dL (ref 3.5–5.0)
Alkaline Phosphatase: 30 U/L — ABNORMAL LOW (ref 38–126)
Anion gap: 8 (ref 5–15)
BUN: 13 mg/dL (ref 6–20)
CO2: 24 mmol/L (ref 22–32)
Calcium: 9.7 mg/dL (ref 8.9–10.3)
Chloride: 105 mmol/L (ref 98–111)
Creatinine, Ser: 0.81 mg/dL (ref 0.44–1.00)
GFR, Estimated: >60 mL/min (ref >60)
Glucose, Bld: 104 mg/dL — ABNORMAL HIGH (ref 70–99)
Potassium: 4.9 mmol/L (ref 3.5–5.1)
Sodium: 137 mmol/L (ref 135–145)
Total Bilirubin: 0.4 mg/dL (ref 0.3–1.2)
Total Protein: 7 g/dL (ref 6.5–8.1)

## 2022-05-17 LAB — URINALYSIS, ROUTINE W REFLEX MICROSCOPIC
Bilirubin Urine: NEGATIVE
Glucose, UA: NEGATIVE mg/dL
Hgb urine dipstick: NEGATIVE
Ketones, ur: NEGATIVE mg/dL
Leukocytes,Ua: NEGATIVE
Nitrite: NEGATIVE
Protein, ur: NEGATIVE mg/dL
Specific Gravity, Urine: 1.01 (ref 1.005–1.030)
pH: 7 (ref 5.0–8.0)

## 2022-05-17 LAB — CBC
HCT: 38.8 % (ref 36.0–46.0)
Hemoglobin: 13.1 g/dL (ref 12.0–15.0)
MCH: 30.3 pg (ref 26.0–34.0)
MCHC: 33.8 g/dL (ref 30.0–36.0)
MCV: 89.6 fL (ref 80.0–100.0)
Platelets: 293 10*3/uL (ref 150–400)
RBC: 4.33 MIL/uL (ref 3.87–5.11)
RDW: 12.7 % (ref 11.5–15.5)
WBC: 8.5 10*3/uL (ref 4.0–10.5)
nRBC: 0 % (ref 0.0–0.2)

## 2022-05-17 LAB — RAPID URINE DRUG SCREEN, HOSP PERFORMED
Amphetamines: NOT DETECTED
Barbiturates: NOT DETECTED
Benzodiazepines: NOT DETECTED
Cocaine: NOT DETECTED
Opiates: NOT DETECTED
Tetrahydrocannabinol: NOT DETECTED

## 2022-05-17 LAB — PROTIME-INR
INR: 1 (ref 0.8–1.2)
Prothrombin Time: 13.1 seconds (ref 11.4–15.2)

## 2022-05-17 LAB — APTT: aPTT: 27 seconds (ref 24–36)

## 2022-05-17 LAB — CBG MONITORING, ED: Glucose-Capillary: 119 mg/dL — ABNORMAL HIGH (ref 70–99)

## 2022-05-17 LAB — ETHANOL: Alcohol, Ethyl (B): <10 mg/dL (ref <10)

## 2022-05-17 MED ORDER — SODIUM CHLORIDE 0.9 % IV BOLUS
1000.0000 mL | Freq: Once | INTRAVENOUS | Status: AC
  Administered 2022-05-17: 1000 mL via INTRAVENOUS

## 2022-05-17 MED ORDER — METOCLOPRAMIDE HCL 5 MG/ML IJ SOLN
10.0000 mg | Freq: Once | INTRAMUSCULAR | Status: AC
  Administered 2022-05-17: 10 mg via INTRAVENOUS
  Filled 2022-05-17: qty 2

## 2022-05-17 MED ORDER — HYDROMORPHONE HCL 1 MG/ML IJ SOLN
0.5000 mg | Freq: Once | INTRAMUSCULAR | Status: AC
  Administered 2022-05-17: 0.5 mg via INTRAVENOUS
  Filled 2022-05-17: qty 1

## 2022-05-17 MED ORDER — LORAZEPAM 2 MG/ML IJ SOLN
1.0000 mg | INTRAMUSCULAR | Status: DC | PRN
  Administered 2022-05-17: 1 mg via INTRAVENOUS
  Filled 2022-05-17 (×2): qty 1

## 2022-05-17 MED ORDER — IOHEXOL 350 MG/ML SOLN
100.0000 mL | Freq: Once | INTRAVENOUS | Status: AC | PRN
  Administered 2022-05-17: 75 mL via INTRAVENOUS

## 2022-05-17 MED ORDER — METOCLOPRAMIDE HCL 5 MG/ML IJ SOLN
10.0000 mg | Freq: Once | INTRAMUSCULAR | Status: AC
Start: 2022-05-17 — End: 2022-05-17
  Administered 2022-05-17: 10 mg via INTRAVENOUS
  Filled 2022-05-17: qty 2

## 2022-05-17 MED ORDER — DEXAMETHASONE SODIUM PHOSPHATE 10 MG/ML IJ SOLN
10.0000 mg | Freq: Once | INTRAMUSCULAR | Status: AC
  Administered 2022-05-17: 10 mg via INTRAVENOUS
  Filled 2022-05-17: qty 1

## 2022-05-17 MED ORDER — MECLIZINE HCL 25 MG PO TABS
25.0000 mg | ORAL_TABLET | Freq: Once | ORAL | Status: AC
  Administered 2022-05-17: 25 mg via ORAL
  Filled 2022-05-17: qty 1

## 2022-05-17 MED ORDER — ACETAMINOPHEN 500 MG PO TABS
1000.0000 mg | ORAL_TABLET | Freq: Once | ORAL | Status: AC
  Administered 2022-05-17: 1000 mg via ORAL
  Filled 2022-05-17: qty 2

## 2022-05-17 MED ORDER — DIPHENHYDRAMINE HCL 50 MG/ML IJ SOLN
50.0000 mg | Freq: Once | INTRAMUSCULAR | Status: AC
  Administered 2022-05-17: 50 mg via INTRAVENOUS
  Filled 2022-05-17: qty 1

## 2022-05-17 MED ORDER — MORPHINE SULFATE (PF) 4 MG/ML IV SOLN
4.0000 mg | Freq: Once | INTRAVENOUS | Status: AC
  Administered 2022-05-17: 4 mg via INTRAVENOUS
  Filled 2022-05-17: qty 1

## 2022-05-17 NOTE — ED Provider Notes (Signed)
Patient care assumed at 2330.  Patient here for evaluation of headache.  Care assumed pending MRI and MRV.  MRI and MRV are negative for acute abnormality.  Discussed with patient findings of studies.  Plan to discharge home, will place referral to neurology.    Quintella Reichert, MD 05/18/22 [phone]

## 2022-05-17 NOTE — ED Notes (Signed)
Pt transported back to MRI.  

## 2022-05-17 NOTE — ED Notes (Signed)
Pt up to [facility] with standby assist without difficulty and without incident.

## 2022-05-17 NOTE — ED Notes (Signed)
Pt remains in MRI at this time  

## 2022-05-17 NOTE — ED Triage Notes (Signed)
Pt BIB [facility] from [place] due to headache and dizziness for the past two days that has been constant.  Pt states that the pain is mostly on the back of her head on the right side.  20g left AC.  VS BP 106/80, HR 92, SpO2 100% RA

## 2022-05-17 NOTE — ED Notes (Signed)
Pt and family is c/o how long they've been here, that she needs something to eat and for the pain and nausea. Spoke with provider advised pt could eat and drink, received orders for pain and nausea meds. Spoke with MRI advised pt has a device that they have to investigate if pt can have an MRI advised they would call back

## 2022-05-17 NOTE — ED Notes (Signed)
Pt in MRI at this time 

## 2022-05-17 NOTE — ED Notes (Signed)
[facility] at bedside to transport pt 

## 2022-05-17 NOTE — ED Provider Notes (Signed)
[facility] EMERGENCY DEPARTMENT Provider Note   CSN: [id number] Arrival date & time: 05/17/22  3419     History  No chief complaint on file.   Kelly Buck is a 41 y.o. female.  The history is provided by the patient, the spouse and medical records. No language interpreter was used.    41 year old female significant history of anxiety presenting complaining of dizziness.  History obtained through patient and through husband who is at bedside.  Patient report last known normal was 2 days ago.  Yesterday morning she woke up noticing severe pain to the back of her head.  She then also noticed feeling dizziness in which she described as a room spinning sensation.  She is having trouble ambulating.  Headache is improving worse when she palpates the back of her head and she felt like there is a bump to the back of her head.  She feels off and report bilateral blurry vision.  She does not endorse any nausea vomiting diarrhea no fever or chills no chest pain or shortness of breath no abdominal pain no focal numbness or focal weakness.  She is a social drinker and smoker.  No prior stroke.  Does have a spinal stimulator for her low back from ruptured disc.  No recent sickness.  Home Medications Prior to Admission medications   Medication Sig Start Date End Date Taking? Authorizing Provider  cephALEXin (KEFLEX) 500 MG capsule Take 1 capsule (500 mg total) by mouth 2 (two) times daily. 11/13/20   Jaynee Eagles, PA-C  HYDROcodone-acetaminophen [facility]) 5-325 MG tablet 1-2 tabs po q6 hours prn pain Patient not taking: Reported on 11/13/2020 06/02/20   Leanora Cover, MD  imipramine (TOFRANIL-PM) 125 MG capsule Take 1 capsule by mouth daily. 08/18/18   [provider]  meloxicam (MOBIC) 15 MG tablet Take 15 mg by mouth daily.    [provider]  pregabalin (LYRICA) 75 MG capsule Take 75 mg by mouth 2 (two) times daily.    [provider]      Allergies    No known  allergies    Review of Systems   Review of Systems  All other systems reviewed and are negative.   Physical Exam Updated Vital Signs BP (!) 139/95   Pulse 61   Temp 97.6 F (36.4 C) (Oral)   Resp 17   Ht '5\' 5"'$  (1.651 m)   Wt 92.5 kg   LMP 11/21/2020   SpO2 100%   BMI 33.95 kg/m  Physical Exam Vitals and nursing note reviewed.  Constitutional:      General: She is not in acute distress.    Appearance: She is well-developed.  HENT:     Head: Atraumatic.  Eyes:     Conjunctiva/sclera: Conjunctivae normal.  Pulmonary:     Effort: Pulmonary effort is normal.  Musculoskeletal:     Cervical back: Neck supple. Tenderness (Tenderness to posterior neck without any overlying skin changes.  Neck with full range of motion) present.  Skin:    Findings: No rash.  Neurological:     Mental Status: She is alert and oriented to person, place, and time.     Comments: Neurologic exam:  Speech clear, pupils equal round reactive to light, extraocular movements intact  Normal peripheral visual fields Cranial nerves III through XII normal including no facial droop Follows commands, moves all extremities x4, normal strength to bilateral upper and lower extremities at all major muscle groups including grip Sensation normal to light  touch Poor finger-to-nose with dysmetria Rapid alternating movements delay Right pronator drift Gait unsteady, rightward leaning   Psychiatric:        Mood and Affect: Mood normal.     ED Results / Procedures / Treatments   Labs (all labs ordered are listed, but only abnormal results are displayed) Labs Reviewed  COMPREHENSIVE METABOLIC PANEL - Abnormal; Notable for the following components:      Result Value   Glucose, Bld 104 (*)    Alkaline Phosphatase 30 (*)    All other components within normal limits  CBG MONITORING, ED - Abnormal; Notable for the following components:   Glucose-Capillary 119 (*)    All other components within normal limits   ETHANOL  PROTIME-INR  APTT  CBC  DIFFERENTIAL  RAPID URINE DRUG SCREEN, HOSP PERFORMED  URINALYSIS, ROUTINE W REFLEX MICROSCOPIC    EKG None  Date: 05/17/2022  Rate: 89  Rhythm: normal sinus rhythm  QRS Axis: normal  Intervals: normal  ST/T Wave abnormalities: normal  Conduction Disutrbances: none  Narrative Interpretation:   Old EKG Reviewed: No significant changes noted    Radiology CT ANGIO HEAD NECK W WO CM  Result Date: 05/17/2022 CLINICAL DATA:  Dizziness EXAM: CT ANGIOGRAPHY HEAD AND NECK TECHNIQUE: Multidetector CT imaging of the head and neck was performed using the standard protocol during bolus administration of intravenous contrast. Multiplanar CT image reconstructions and MIPs were obtained to evaluate the vascular anatomy. Carotid stenosis measurements (when applicable) are obtained utilizing NASCET criteria, using the distal internal carotid diameter as the denominator. RADIATION DOSE REDUCTION: This exam was performed according to the departmental dose-optimization program which includes automated exposure control, adjustment of the mA and/or kV according to patient size and/or use of iterative reconstruction technique. CONTRAST:  8m OMNIPAQUE IOHEXOL 350 MG/ML SOLN COMPARISON:  12/23/2016 CT head, no prior CTA FINDINGS: CT HEAD FINDINGS Brain: No evidence of acute infarct, hemorrhage, mass, mass effect, or midline shift. No hydrocephalus or extra-axial fluid collection. Vascular: No hyperdense vessel. Skull: Normal. Negative for fracture or focal lesion. Sinuses/Orbits: No acute finding. Other: The mastoid air cells are well aerated. CTA NECK FINDINGS Aortic arch: Standard branching. Imaged portion shows no evidence of aneurysm or dissection. No significant stenosis of the major arch vessel origins. Right carotid system: No evidence of dissection, occlusion, or hemodynamically significant stenosis (greater than 50%). Left carotid system: No evidence of dissection,  occlusion, or hemodynamically significant stenosis (greater than 50%). Vertebral arteries: No evidence of dissection, occlusion, or hemodynamically significant stenosis (greater than 50%). Skeleton: No acute osseous abnormality. Other neck: No acute finding. Upper chest: No focal pulmonary opacity or pleural effusion. Review of the MIP images confirms the above findings CTA HEAD FINDINGS Anterior circulation: Both internal carotid arteries are patent to the termini, without significant stenosis. A1 segments patent. Normal anterior communicating artery. Anterior cerebral arteries are patent to their distal aspects. No M1 stenosis or occlusion. MCA branches perfused and symmetric. Posterior circulation: Vertebral arteries patent to the vertebrobasilar junction without stenosis. Posterior inferior cerebellar arteries patent proximally. Basilar patent to its distal aspect. Superior cerebellar arteries patent proximally. Patent left P1. No definite right P1 visualized. Fetal origin of the right PCA from a patent right posterior communicating artery. PCAs perfused to their distal aspects without stenosis. The left posterior communicating artery is not visualized. Venous sinuses: As permitted by contrast timing, patent. Anatomic variants: Fetal origin of the right PCA. Review of the MIP images confirms the above findings IMPRESSION: 1.  No  acute intracranial process. 2.  No intracranial large vessel occlusion or significant stenosis. 3.  No hemodynamically significant stenosis in the neck. Electronically Signed   By: Merilyn Baba M.D.   On: 05/17/2022 12:15    Procedures .Critical Care  Performed by: Domenic Moras, PA-C Authorized by: Domenic Moras, PA-C   Critical care provider statement:    Critical care time (minutes):  30   Critical care was time spent personally by me on the following activities:  Development of treatment plan with patient or surrogate, discussions with consultants, evaluation of patient's  response to treatment, examination of patient, ordering and review of laboratory studies, ordering and review of radiographic studies, ordering and performing treatments and interventions, pulse oximetry, re-evaluation of patient's condition and review of old charts     Medications Ordered in ED Medications  dexamethasone (DECADRON) injection 10 mg (has no administration in time range)  diphenhydrAMINE (BENADRYL) injection 50 mg (has no administration in time range)  metoCLOPramide (REGLAN) injection 10 mg (has no administration in time range)  meclizine (ANTIVERT) tablet 25 mg (25 mg Oral Given 05/17/22 1027)  sodium chloride 0.9 % bolus 1,000 mL (0 mLs Intravenous Stopped 05/17/22 1143)  acetaminophen (TYLENOL) tablet 1,000 mg (1,000 mg Oral Given 05/17/22 1142)  iohexol (OMNIPAQUE) 350 MG/ML injection 100 mL (75 mLs Intravenous Contrast Given 05/17/22 1147)    ED Course/ Medical Decision Making/ A&P                           Medical Decision Making Amount and/or Complexity of Data Reviewed Labs: ordered. Radiology: ordered.  Risk OTC drugs. Prescription drug management.   BP 130/90   Pulse 65   Temp 97.6 F (36.4 C) (Oral)   Resp 20   Ht '5\' 5"'$  (1.651 m)   Wt 92.5 kg   LMP 11/21/2020   SpO2 98%   BMI 33.95 kg/m   10:12 AM This is a 41 year old female with history of anxiety brought here with concerns for dizziness and strokelike symptoms.  Last known normal was approximately 2 days ago.  Patient report yesterday morning she woke up with intense pain to the back of her head.  She then endorsed feeling dizziness with room spinning sensation.  This sensation has been waxing waning but becoming progressively worse throughout the day.  She is having trouble with ambulation.  She endorsed having blurry vision.  She endorsed discomfort to the back of her neck.  On exam, patient has some reproducible pain to her posterior cervical spine without any overlying skin changes.  She has  full range of motion about her neck.  Although she does not have a facial droop and no visible nystagmus on exam.  She does exhibit dysmetria, having a right pronator drift, and unsteady gait with rightward leaning.  She is outside the window for emergent treatment of potential stroke.  Will obtain head and neck CT angiogram along with stroke work-up.  Anticipate consultation to neurology for further recommendation once imaging has returned.  12:42 PM Labs and imaging independently viewed interpreted by me and I agree with radiologist interpretation.  Patient's labs are reassuring, no anemia, no electrolyte imbalance normal UDS normal EtOH.  Urinalysis without signs of urinary tract infection.  CT scan of the head and neck obtained showed no acute intracranial process and no intracranial large vessel occlusion or significant stenosis.  I appreciate consultation to on-call neurologist Dr. Malen Gauze who recommend patient to undergo an MRV  of the brain as well as an MRI for further assessment.  Patient will need to be transferred to [facility] to receive MRI.  Patient does have a spinal stimulator from [employer].   I discussed this with the patient and her preference is to go to [facility] for her care instead.  1:29 PM I have consulted with [facility] neurology who felt due to the possible prolonged wait time at [facility] he prefers patient to go to [facility] instead.  Patient is in agreement with plan.  I have reached out to ER provider Dr. Johnney Killian who agrees to accept patient to [facility] for MRI and further assessment.   This patient presents to the ED for concern of stroke sxs, this involves an extensive number of treatment options, and is a complaint that carries with it a high risk of complications and morbidity.  The differential diagnosis includes stroke, complicated migraine, vestibular neuritis, anemia  Co morbidities that complicate the patient evaluation anxiety Additional  history obtained:  Additional history obtained from husband External records from outside source obtained and reviewed including EMR including prior labs and imaging  Lab Tests:  I Ordered, and personally interpreted labs.  The pertinent results include:  as above  Imaging Studies ordered:  I ordered imaging studies including head/neck CT I independently visualized and interpreted imaging which showed no acute finding I agree with the radiologist interpretation  Cardiac Monitoring:  The patient was maintained on a cardiac monitor.  I personally viewed and interpreted the cardiac monitored which showed an underlying rhythm of: NSR  Medicines ordered and prescription drug management:  I ordered medication including migraine cocktail  for headache Reevaluation of the patient after these medicines showed that the patient improved I have reviewed the patients home medicines and have made adjustments as needed  Test Considered: as above  Critical Interventions: consult neuro  CT/MRIs  Consultations Obtained:  I requested consultation with the neurologist Dr. Malen Gauze,  and discussed lab and imaging findings as well as pertinent plan - they recommend: transfer to [facility] for Brain MRV and MRI  Problem List / ED Course: headache  dizzy  Reevaluation:  After the interventions noted above, I reevaluated the patient and found that they have :improved  Social Determinants of Health: tobacco use  Dispostion:  After consideration of the diagnostic results and the patients response to treatment, I feel that the patent would benefit from transfer to [facility] for further work up.         Final Clinical Impression(s) / ED Diagnoses Final diagnoses:  Bad headache  Dizziness    Rx / DC Orders ED Discharge Orders     None         Domenic Moras, PA-C 05/17/22 1339    Tegeler, Gwenyth Allegra, MD 05/17/22 [phone]

## 2022-05-17 NOTE — ED Provider Notes (Signed)
Pt was a transfer from [facility] with sx of blurred vision, headache/neck pain, ataxia.  Found to have pronator drift and ataxia with mild fall to the right.  CTA and CT of head were neg.  Pt sent here based on neuro recommendation for further imaging with MRI and MRV.  Pt has been waiting a very long time for MRI.  She has a spinal stimulator that is MRI compatible but they needed to research how she gets the scan and she is still c/o of HA.  She was given meds here for pain.  She has to go for the MRI in 2 different phases.  Once the MRI's are back can discuss with neurology.  Pt and her husband also feel like her face is swollen. On head CT and CTA of neck there was no facial swelling noted.  No upper lung nodules present concerning for SVC.  Pt is having no SOB at this time and VS are normal.   Blanchie Dessert, MD 05/17/22 2325

## 2022-05-17 NOTE — ED Triage Notes (Signed)
Started yesterday with severe head pain ,back right sided. Does not remember falling. Pt states she is very dizzy. Tech stated pt almost fell two times while getting in wheel chair.

## 2022-05-17 NOTE — ED Notes (Signed)
Pt swallow screen was completed prior to medication given

## 2022-05-18 MED ORDER — GADOBUTROL 1 MMOL/ML IV SOLN
9.0000 mL | Freq: Once | INTRAVENOUS | Status: AC | PRN
  Administered 2022-05-18: 9 mL via INTRAVENOUS

## 2022-05-18 NOTE — ED Notes (Signed)
Provider at bedside

## 2022-05-18 NOTE — ED Notes (Signed)
Pt remains at MRI

## 2022-05-23 ENCOUNTER — Ambulatory Visit: Payer: Medicaid Other | Admitting: Psychiatry

## 2022-05-23 VITALS — BP 131/90 | HR 78 | Ht 65.0 in | Wt 213.1 lb

## 2022-05-23 DIAGNOSIS — G43101 Migraine with aura, not intractable, with status migrainosus: Secondary | ICD-10-CM

## 2022-05-23 DIAGNOSIS — R531 Weakness: Secondary | ICD-10-CM | POA: Diagnosis not present

## 2022-05-23 MED ORDER — UBRELVY 100 MG PO TABS
100.0000 mg | ORAL_TABLET | ORAL | 6 refills | Status: DC | PRN
Start: 2022-05-23 — End: 2023-05-16

## 2022-05-23 MED ORDER — LORAZEPAM 0.5 MG PO TABS: ORAL_TABLET | ORAL | 0 refills | Status: AC

## 2022-05-23 MED ORDER — KETOROLAC TROMETHAMINE 60 MG/2ML IM SOLN
60.0000 mg | Freq: Once | INTRAMUSCULAR | Status: AC
  Administered 2022-05-23: 60 mg via INTRAMUSCULAR

## 2022-05-23 MED ORDER — KETOROLAC TROMETHAMINE 10 MG PO TABS: 10.0000 mg | ORAL_TABLET | Freq: Three times a day (TID) | ORAL | 0 refills | Status: AC

## 2022-05-23 NOTE — Progress Notes (Signed)
Referring:  Quintella Reichert, MD [place],  [place] [address]  PCP: Patient, No Pcp Per  Neurology was asked to evaluate Kelly Buck, a 41 year old female for a chief complaint of headaches.  Our recommendations of care will be communicated by shared medical record.    CC:  headaches  History provided from self  HPI:  Medical co-morbidities: DDD L5-S1 s/p spinal stimulator, hx cervical cancer s/p hysterectomy  The patient presents for evaluation of headaches which began on 05/16/22. At that time she developed severe pain in her occiput, then blurred vision in her right eye, and paresthesias in her right arm. Felt like her face was swollen. She was slurring her words as well. Felt imbalanced and lightheaded. Went to bed that night but did not feel better the next day so she presented to the ED.  She presented to the ED 05/17/22 where CTA, MRI, and MRV were negative. She was given a migraine cocktail and morphine which did not help her headaches.  Since her ED visit she continues to have a constant headache. It has been slowly improving over time. Continues to feel lightheaded and has some vertigo when she bends forward. She feels very fatigued. Right arm still has paresthesias and feels weak. Feels she is dragging her right leg. Vision has returned to baseline.  Denies history of frequent headaches though she did present to the ED with a severe migraine in February 2022.  Headache History: Onset: 1 week ago Triggers: screens Aura: blurred vision, paresthesias, weakness Location: occiput Associated Symptoms:  Photophobia: yes  Phonophobia: yes  Nausea: no Vomiting: no Other symptoms: dizziness Worse with activity?: yes Duration of headaches: constant   Headache days per month: 7 Headache free days per month: 23  Current Treatment: Abortive ibuprofen  Preventative none  Prior Therapies                                 Lyrica 75 mg BID Imipramine 125  daily - memory issues  LABS: CBC    Component Value Date/Time   WBC 8.5 05/17/2022 1028   RBC 4.33 05/17/2022 1028   HGB 13.1 05/17/2022 1028   HCT 38.8 05/17/2022 1028   PLT 293 05/17/2022 1028   MCV 89.6 05/17/2022 1028   MCH 30.3 05/17/2022 1028   MCHC 33.8 05/17/2022 1028   RDW 12.7 05/17/2022 1028   LYMPHSABS 3.4 05/17/2022 1028   MONOABS 0.5 05/17/2022 1028   EOSABS 0.4 05/17/2022 1028   BASOSABS 0.0 05/17/2022 1028      Latest Ref Rng & Units 05/17/2022   10:28 AM 12/05/2020    8:18 PM 10/26/2018   12:42 AM  CMP  Glucose 70 - 99 mg/dL 104  116  112   BUN 6 - 20 mg/dL '13  8  12   '$ Creatinine 0.44 - 1.00 mg/dL 0.81  0.83  0.57   Sodium 135 - 145 mmol/L 137  138  136   Potassium 3.5 - 5.1 mmol/L 4.9  3.6  3.9   Chloride 98 - 111 mmol/L 105  103  105   CO2 22 - 32 mmol/L '24  20  22   '$ Calcium 8.9 - 10.3 mg/dL 9.7  9.2  9.1   Total Protein 6.5 - 8.1 g/dL 7.0   6.7   Total Bilirubin 0.3 - 1.2 mg/dL 0.4   0.4   Alkaline Phos 38 - 126 U/L  30   45   AST 15 - 41 U/L 19   17   ALT 0 - 44 U/L 16   16     IMAGING:  MRI/MRV brain without contrast 05/17/22: unremarkable  CTA head/neck 05/13/22: unremarkable  Imaging independently reviewed on May 23, 2022   Current Outpatient Medications on File Prior to Visit  Medication Sig Dispense Refill   imipramine (TOFRANIL-PM) 125 MG capsule Take 1 capsule by mouth daily.  5   cephALEXin (KEFLEX) 500 MG capsule Take 1 capsule (500 mg total) by mouth 2 (two) times daily. 20 capsule 0   HYDROcodone-acetaminophen (NORCO) 5-325 MG tablet 1-2 tabs po q6 hours prn pain (Patient not taking: Reported on 11/13/2020) 20 tablet 0   meloxicam (MOBIC) 15 MG tablet Take 15 mg by mouth daily.     pregabalin (LYRICA) 75 MG capsule Take 75 mg by mouth 2 (two) times daily.     No current facility-administered medications on file prior to visit.     Allergies: Allergies  Allergen Reactions   No Known Allergies     Family History: Migraine or  other headaches in the family:  no Aneurysms in a first degree relative:  no Brain tumors in the family:  no Other neurological illness in the family:   no  Past Medical History: Past Medical History:  Diagnosis Date   Anxiety    Complication of anesthesia    Endometriosis    Palpitations    PONV (postoperative nausea and vomiting)    Vaginal Pap smear, abnormal     Past Surgical History Past Surgical History:  Procedure Laterality Date   ABDOMINOPLASTY     CARPAL TUNNEL RELEASE Right    CARPAL TUNNEL RELEASE Left 06/02/2020   Procedure: LEFT CARPAL TUNNEL RELEASE;  Surgeon: Leanora Cover, MD;  Location: [place];  Service: Orthopedics;  Laterality: Left;  Bier block   CERVICAL BIOPSY     DILATION AND EVACUATION N/A 06/01/2014   Procedure: DILATATION AND EVACUATION;  Surgeon: [name], MD;  Location: [facility];  Service: Gynecology;  Laterality: N/A;   DILATION AND EVACUATION N/A 06/04/2014   Procedure: DILATATION AND EVACUATION;  Surgeon: [name], MD;  Location: [facility];  Service: Gynecology;  Laterality: N/A;   LUMBAR LAMINECTOMY/DECOMPRESSION MICRODISCECTOMY Right 10/31/2016   Procedure: RIGHT SIDED LUMBAR 5-SACRUM 1 MICRODISCECTOMY;  Surgeon: Phylliss Bob, MD;  Location: [place];  Service: Orthopedics;  Laterality: Right;  RIGHT SIDED LUMBAR 5-SACRUM 1 MICRODISCECTOMY   PLACEMENT OF BREAST IMPLANTS      Social History: Social History   Tobacco Use   Smoking status: Former    Packs/day: 0.25    Years: 4.00    Total pack years: 1.00    Types: Cigarettes   Smokeless tobacco: Never  Vaping Use   Vaping Use: Never used  Substance Use Topics   Alcohol use: Yes    Comment: 2/wk   Drug use: No    ROS: Negative for fevers, chills. Positive for headaches. All other systems reviewed and negative unless stated otherwise in HPI.   Physical Exam:   Vital Signs: BP (!) 131/90   Pulse 78   Ht '5\' 5"'$  (1.651 m)   Wt 213 lb 2 oz (96.7 kg)    LMP 11/21/2020   BMI 35.47 kg/m  GENERAL: well appearing,in no acute distress,alert SKIN:  Color, texture, turgor normal. No rashes or lesions HEAD:  Normocephalic/atraumatic. CV:  RRR RESP: Normal respiratory effort MSK: no tenderness  to palpation over occiput, neck, or shoulders  NEUROLOGICAL: Mental Status: Alert, oriented to person, place and time,Follows commands Cranial Nerves: PERRL, visual fields intact to confrontation, extraocular movements intact, facial sensation intact, no facial droop or ptosis, hearing grossly intact, no dysarthria, palate elevate symmetrically, tongue protrudes midline, shoulder shrug intact and symmetric Motor: at least 4/5 with giveway weakness RUE and RLE, 5/5 LUE and LLE Reflexes: 2+ throughout Sensation: decreased sensation to light touch RUE, otherwise sensation intact to light touch throughout Coordination: Finger-to- nose-finger intact bilaterally,Heel-to-shin intact bilaterally Gait: normal-based  Dix-Hallpike negative. Becomes lightheaded with sitting upright.  IMPRESSION: 41 year old female with a history of  DDD L5-S1 s/p spinal stimulator, hx cervical cancer s/p hysterectomy who presents for evaluation of headaches, blurred vision, paresthesias, and right sided weakness. MRI/MRV brain and CTA head/neck were unremarkable. Will order MRI C-spine as she reports persistent paresthesias and weakness on the right side. Exam does reveal some giveway weakness on the right. Suspect she is in status migrainosus and symptoms are secondary to migraine aura with some possible functional overlay. Will give a Toradol shot today and oral Toradol for the next 4 days to try to break her headache. Would prefer to avoid triptans as she has prolonged aura and weakness associated with her migraine. Will start Ubrelvy for migraine rescue.  PLAN: -MRI C-spine -Toradol shot today followed by Toradol 10 mg TID x4 days -Start Ubrelvy 100 mg PRN for migraine rescue   I  spent a total of 60 minutes chart reviewing and counseling the patient. Headache education was done. Discussed medication side effects, adverse reactions and drug interactions. Written educational materials and patient instructions outlining all of the above were given.  Follow-up: 3 months   Genia Harold, MD 05/23/2022   4:24 PM

## 2022-05-23 NOTE — Patient Instructions (Addendum)
Try Roselyn Meier as needed for migraines. Take one pill as needed for headache, can repeat dose in 2 hours if needed. Max dose 2 pills in 24 hours.  Take Toradol 10 mg three times a day for the next four

## 2022-05-24 ENCOUNTER — Telehealth: Payer: Self-pay | Admitting: *Deleted

## 2022-05-24 NOTE — Telephone Encounter (Signed)
Roselyn Meier PA, Key: [id number], [id number].  Received message: Kelly Buck To Other Processor Or Primary Payer. Called pharmacy, spoke with Colletta Maryland and asked for the insurance information. She stated when she runs Rx through [facility] plan it says patient has other coverage. Colletta Maryland doesn't have the information. Called patient who stated her husband has changed jobs. They are waiting for his new insurance to begin, hopefully in a month. She wasn't aware she had Medicaid until she went to ED. I advised she call Medicaid and tell them she has no other active coverage then call me back. Patient verbalized understanding, appreciation.

## 2022-05-30 ENCOUNTER — Telehealth: Payer: Self-pay | Admitting: Psychiatry

## 2022-05-30 NOTE — Telephone Encounter (Signed)
Medicaid healthy blue Kelly Buck: [id number] exp. 05/30/22-07/28/22 sent to [facility]

## 2022-06-08 ENCOUNTER — Inpatient Hospital Stay: Payer: Medicaid Other | Admitting: Neurology

## 2022-06-19 NOTE — Telephone Encounter (Signed)
Called patient to follow up on [place]. She stated that her family dr sent her to [facility] neurology. She stated a clot in her brain was found and she is on medication. She will not start ubrelvy. Will advise Dr Billey Gosling.

## 2023-04-12 ENCOUNTER — Other Ambulatory Visit: Payer: Self-pay | Admitting: Orthopaedic Surgery

## 2023-04-12 DIAGNOSIS — M7532 Calcific tendinitis of left shoulder: Secondary | ICD-10-CM

## 2023-04-15 ENCOUNTER — Ambulatory Visit
Admission: RE | Admit: 2023-04-15 | Discharge: 2023-04-15 | Disposition: A | Discharge status: Home / Self Care | Payer: Medicaid Other | Source: Ambulatory Visit | Attending: Orthopaedic Surgery | Admitting: Orthopaedic Surgery

## 2023-04-15 ENCOUNTER — Other Ambulatory Visit: Payer: Medicaid Other

## 2023-04-15 DIAGNOSIS — M7532 Calcific tendinitis of left shoulder: Secondary | ICD-10-CM

## 2023-04-28 ENCOUNTER — Other Ambulatory Visit: Payer: Medicaid Other

## 2023-05-16 ENCOUNTER — Other Ambulatory Visit: Payer: Self-pay

## 2023-05-16 ENCOUNTER — Encounter (HOSPITAL_BASED_OUTPATIENT_CLINIC_OR_DEPARTMENT_OTHER): Payer: Self-pay | Admitting: Orthopaedic Surgery

## 2023-05-22 NOTE — H&P (Signed)
PREOPERATIVE H&P  Chief Complaint: LEFT SHOULDER CARTILAGE DISORDER, OA, IMPINGEMENT, BICEPS TENDINITS  HPI: Kelly Buck is a 42 y.o. female who is scheduled for, Procedure(s): SHOULDER ARTHROSCOPY WITH SUBACROMIAL DECOMPRESSION AND BICEP TENDON REPAIR SHOULDER ARTHROSCOPY WITH DISTAL CLAVICLE EXCISION.   Patient has a past medical history significant for PONV.   The patient is a 42 year old who has had left shoulder pain, which began in about early May when she was getting off of her husband's motorcycle and pushing off.  She did not feel a pop.  She states ever since then she has had pain in the anterior aspect of her shoulder and it is shooting down her arm.  She feels like her range of motion is decreasing.  She has tried ice and heat and NSAID's.  Nothing is helping it.  She is frustrated by the function of her left shoulder.  She only got minimal improvement with injections.   Symptoms are rated as moderate to severe, and have been worsening.  This is significantly impairing activities of daily living.    Please see clinic note for further details on this patient's care.    She has elected for surgical management.   Past Medical History:  Diagnosis Date   Anxiety    Complication of anesthesia    Endometriosis    H/O spinal cord injury    Migraines    Palpitations    PONV (postoperative nausea and vomiting)    S/P insertion of spinal cord stimulator    Vaginal Pap smear, abnormal    Past Surgical History:  Procedure Laterality Date   ABDOMINOPLASTY     CARPAL TUNNEL RELEASE Right    CARPAL TUNNEL RELEASE Left 06/02/2020   Procedure: LEFT CARPAL TUNNEL RELEASE;  Surgeon: Betha Loa, MD;  Location: [facility];  Service: Orthopedics;  Laterality: Left;  Bier block   CERVICAL BIOPSY     DILATION AND EVACUATION N/A 06/01/2014   Procedure: DILATATION AND EVACUATION;  Surgeon: Loney Laurence, MD;  Location: [facility];  Service: Gynecology;  Laterality:  N/A;   DILATION AND EVACUATION N/A 06/04/2014   Procedure: DILATATION AND EVACUATION;  Surgeon: Loney Laurence, MD;  Location: [facility];  Service: Gynecology;  Laterality: N/A;   LUMBAR LAMINECTOMY/DECOMPRESSION MICRODISCECTOMY Right 10/31/2016   Procedure: RIGHT SIDED LUMBAR 5-SACRUM 1 MICRODISCECTOMY;  Surgeon: Estill Bamberg, MD;  Location: [facility];  Service: Orthopedics;  Laterality: Right;  RIGHT SIDED LUMBAR 5-SACRUM 1 MICRODISCECTOMY   PLACEMENT OF BREAST IMPLANTS     Social History   Socioeconomic History   Marital status: Married    Spouse name: Not on file   Number of children: 4   Years of education: Not on file   Highest education level: Not on file  Occupational History   Not on file  Tobacco Use   Smoking status: Former    Current packs/day: 0.25    Average packs/day: 0.3 packs/day for 4.0 years (1.0 ttl pk-yrs)    Types: Cigarettes   Smokeless tobacco: Never  Vaping Use   Vaping status: Never Used  Substance and Sexual Activity   Alcohol use: Yes    Comment: 2/wk   Drug use: No   Sexual activity: Yes  Other Topics Concern   Not on file  Social History Narrative   Not on file   Social Determinants of Health   Financial Resource Strain: Not on file  Food Insecurity: Low Risk  (04/12/2023)   Received from [facility]  [facility]   Food vital sign    Within the past 12 months, you worried that your food would run out before you got money to buy more: Never true    Within the past 12 months, the food you bought just didn't last and you didn't have money to get more. : Never true  Transportation Needs: No Transportation Needs (04/12/2023)   [facility]   Transportation    In the past 12 months, has lack of reliable transportation kept you from medical appointments, meetings, work or from getting things needed for daily living? : No  Physical Activity: Not on file  Stress: Not on file  Social Connections: Not on file   Family  History  Problem Relation Age of Onset   Healthy Mother    Carpal tunnel syndrome Mother    Healthy Father    Healthy Brother    Healthy Brother    Hodgkin's lymphoma Maternal Grandmother    Lupus Paternal Grandmother    Dementia Paternal Grandmother    Fibromyalgia Paternal Grandmother    Allergies  Allergen Reactions   No Known Allergies    Prior to Admission medications   Medication Sig Start Date End Date Taking? Authorizing Provider  imipramine (TOFRANIL-PM) 125 MG capsule Take 1 capsule by mouth daily. 08/18/18  Yes [provider]  verapamil (CALAN-SR) 240 MG CR tablet Take 240 mg by mouth at bedtime.   Yes [provider]  LORazepam (ATIVAN) 0.5 MG tablet Take 1-2 pills 30 minutes before MRI 05/23/22   Ocie Doyne, MD    ROS: All other systems have been reviewed and were otherwise negative with the exception of those mentioned in the HPI and as above.  Physical Exam: General: Alert, no acute distress Cardiovascular: No pedal edema Respiratory: No cyanosis, no use of accessory musculature GI: No organomegaly, abdomen is soft and non-tender Skin: No lesions in the area of chief complaint Neurologic: Sensation intact distally Psychiatric: Patient is competent for consent with normal mood and affect Lymphatic: No axillary or cervical lymphadenopathy  MUSCULOSKELETAL:  Active forward elevation to 140, passive to 160. External rotation to 60. Cuff strength is 4/5 supraspinatus and infraspinatus. Positive AC tenderness. Impingement with O'Brien's. Bicipital groove tenderness to palpation. Positive cross body.   Imaging: MRI is reviewed demonstrating edema in the distal clavicle with significant fluid around the biceps and possible longitudinal tearing of the biceps. There is a partial thickness tear of the bursal side of the supraspinatus nearing 50% cuff pathology, biceps pathology, AC arthritis and impingement  Assessment: LEFT SHOULDER CARTILAGE  DISORDER, OA, IMPINGEMENT, BICEPS TENDINITS  Plan: Plan for Procedure(s): SHOULDER ARTHROSCOPY WITH SUBACROMIAL DECOMPRESSION AND BICEP TENDON REPAIR SHOULDER ARTHROSCOPY WITH DISTAL CLAVICLE EXCISION  The risks benefits and alternatives were discussed with the patient including but not limited to the risks of nonoperative treatment, versus surgical intervention including infection, bleeding, nerve injury,  blood clots, cardiopulmonary complications, morbidity, mortality, among others, and they were willing to proceed.   The patient acknowledged the explanation, agreed to proceed with the plan and consent was signed.   Operative Plan: Left shoulder scope with SAD, DCE, BT Discharge Medications: standard DVT Prophylaxis: none Physical Therapy: outpatient PT Special Discharge needs: Sling. IceMan   Vernetta Honey, PA-C  05/22/2023 4:37 PM

## 2023-05-23 ENCOUNTER — Ambulatory Visit (HOSPITAL_BASED_OUTPATIENT_CLINIC_OR_DEPARTMENT_OTHER): Payer: BC Managed Care – PPO | Admitting: Certified Registered"

## 2023-05-23 ENCOUNTER — Encounter (HOSPITAL_BASED_OUTPATIENT_CLINIC_OR_DEPARTMENT_OTHER)
Admission: RE | Disposition: A | Discharge status: Home / Self Care | Payer: Self-pay | Source: Home / Self Care | Attending: Orthopaedic Surgery

## 2023-05-23 ENCOUNTER — Ambulatory Visit (HOSPITAL_BASED_OUTPATIENT_CLINIC_OR_DEPARTMENT_OTHER)
Admission: RE | Admit: 2023-05-23 | Discharge: 2023-05-23 | Disposition: A | Discharge status: Home / Self Care | Payer: BC Managed Care – PPO | Attending: Orthopaedic Surgery | Admitting: Orthopaedic Surgery

## 2023-05-23 ENCOUNTER — Encounter (HOSPITAL_BASED_OUTPATIENT_CLINIC_OR_DEPARTMENT_OTHER): Payer: Self-pay | Admitting: Orthopaedic Surgery

## 2023-05-23 DIAGNOSIS — S46019A Strain of muscle(s) and tendon(s) of the rotator cuff of unspecified shoulder, initial encounter: Secondary | ICD-10-CM | POA: Diagnosis not present

## 2023-05-23 DIAGNOSIS — M25812 Other specified joint disorders, left shoulder: Secondary | ICD-10-CM | POA: Diagnosis not present

## 2023-05-23 DIAGNOSIS — M7522 Bicipital tendinitis, left shoulder: Secondary | ICD-10-CM | POA: Diagnosis not present

## 2023-05-23 DIAGNOSIS — X58XXXA Exposure to other specified factors, initial encounter: Secondary | ICD-10-CM | POA: Insufficient documentation

## 2023-05-23 DIAGNOSIS — M19012 Primary osteoarthritis, left shoulder: Secondary | ICD-10-CM | POA: Diagnosis not present

## 2023-05-23 DIAGNOSIS — F419 Anxiety disorder, unspecified: Secondary | ICD-10-CM | POA: Diagnosis not present

## 2023-05-23 DIAGNOSIS — Z87891 Personal history of nicotine dependence: Secondary | ICD-10-CM | POA: Insufficient documentation

## 2023-05-23 DIAGNOSIS — M5137 Other intervertebral disc degeneration, lumbosacral region: Secondary | ICD-10-CM | POA: Diagnosis not present

## 2023-05-23 HISTORY — PX: SHOULDER ARTHROSCOPY WITH DISTAL CLAVICLE RESECTION: SHX5675

## 2023-05-23 HISTORY — DX: Migraine, unspecified, not intractable, without status migrainosus: G43.909

## 2023-05-23 HISTORY — DX: Presence of other specified functional implants: Z96.89

## 2023-05-23 HISTORY — PX: SHOULDER ARTHROSCOPY WITH SUBACROMIAL DECOMPRESSION AND BICEP TENDON REPAIR: SHX5689

## 2023-05-23 HISTORY — DX: Personal history of other (healed) physical injury and trauma: Z87.828

## 2023-05-23 SURGERY — SHOULDER ARTHROSCOPY WITH SUBACROMIAL DECOMPRESSION AND BICEP TENDON REPAIR
Anesthesia: General | Site: Shoulder | Laterality: Left

## 2023-05-23 MED ORDER — ACETAMINOPHEN 500 MG PO TABS
ORAL_TABLET | ORAL | Status: AC
  Filled 2023-05-23: qty 2

## 2023-05-23 MED ORDER — GABAPENTIN 300 MG PO CAPS
ORAL_CAPSULE | ORAL | Status: AC
  Filled 2023-05-23: qty 1

## 2023-05-23 MED ORDER — DEXAMETHASONE SODIUM PHOSPHATE 10 MG/ML IJ SOLN
INTRAMUSCULAR | Status: DC | PRN
  Administered 2023-05-23: 10 mg via INTRAVENOUS

## 2023-05-23 MED ORDER — PHENYLEPHRINE HCL-NACL 20-0.9 MG/250ML-% IV SOLN
INTRAVENOUS | Status: DC | PRN
  Administered 2023-05-23: 25 ug/min via INTRAVENOUS

## 2023-05-23 MED ORDER — ROCURONIUM BROMIDE 10 MG/ML (PF) SYRINGE
PREFILLED_SYRINGE | INTRAVENOUS | Status: AC
  Filled 2023-05-23: qty 10

## 2023-05-23 MED ORDER — VANCOMYCIN HCL 1000 MG IV SOLR
INTRAVENOUS | Status: AC
  Filled 2023-05-23: qty 20

## 2023-05-23 MED ORDER — OXYCODONE HCL 5 MG PO TABS
ORAL_TABLET | ORAL | 0 refills | Status: AC
Start: 2023-05-23 — End: 2023-05-28

## 2023-05-23 MED ORDER — ONDANSETRON HCL 4 MG/2ML IJ SOLN
INTRAMUSCULAR | Status: AC
  Filled 2023-05-23: qty 2

## 2023-05-23 MED ORDER — FENTANYL CITRATE (PF) 100 MCG/2ML IJ SOLN
INTRAMUSCULAR | Status: AC
  Filled 2023-05-23: qty 2

## 2023-05-23 MED ORDER — FENTANYL CITRATE (PF) 100 MCG/2ML IJ SOLN
100.0000 ug | Freq: Once | INTRAMUSCULAR | Status: AC
  Administered 2023-05-23: 100 ug via INTRAVENOUS

## 2023-05-23 MED ORDER — CEFAZOLIN SODIUM-DEXTROSE 2-4 GM/100ML-% IV SOLN
INTRAVENOUS | Status: AC
  Filled 2023-05-23: qty 100

## 2023-05-23 MED ORDER — AMISULPRIDE (ANTIEMETIC) 5 MG/2ML IV SOLN: 10.0000 mg | Freq: Once | INTRAVENOUS | Status: DC | PRN

## 2023-05-23 MED ORDER — SUGAMMADEX SODIUM 200 MG/2ML IV SOLN
INTRAVENOUS | Status: DC | PRN
  Administered 2023-05-23: 200 mg via INTRAVENOUS

## 2023-05-23 MED ORDER — PHENYLEPHRINE 80 MCG/ML (10ML) SYRINGE FOR IV PUSH (FOR BLOOD PRESSURE SUPPORT)
PREFILLED_SYRINGE | INTRAVENOUS | Status: AC
  Filled 2023-05-23: qty 10

## 2023-05-23 MED ORDER — PHENYLEPHRINE HCL (PRESSORS) 10 MG/ML IV SOLN
INTRAVENOUS | Status: DC | PRN
  Administered 2023-05-23 (×2): 80 ug via INTRAVENOUS

## 2023-05-23 MED ORDER — MIDAZOLAM HCL 2 MG/2ML IJ SOLN
INTRAMUSCULAR | Status: AC
  Filled 2023-05-23: qty 2

## 2023-05-23 MED ORDER — PROPOFOL 10 MG/ML IV BOLUS
INTRAVENOUS | Status: DC | PRN
Start: 2023-05-23 — End: 2023-05-23
  Administered 2023-05-23: 150 mg via INTRAVENOUS

## 2023-05-23 MED ORDER — EPHEDRINE 5 MG/ML INJ
INTRAVENOUS | Status: AC
  Filled 2023-05-23: qty 5

## 2023-05-23 MED ORDER — DEXAMETHASONE SODIUM PHOSPHATE 10 MG/ML IJ SOLN
INTRAMUSCULAR | Status: AC
  Filled 2023-05-23: qty 1

## 2023-05-23 MED ORDER — SODIUM CHLORIDE 0.9 % IR SOLN
Status: DC | PRN
  Administered 2023-05-23: 6000 mL

## 2023-05-23 MED ORDER — ONDANSETRON HCL 4 MG PO TABS: 4.0000 mg | ORAL_TABLET | Freq: Three times a day (TID) | ORAL | 0 refills | Status: AC | PRN

## 2023-05-23 MED ORDER — GABAPENTIN 300 MG PO CAPS
300.0000 mg | ORAL_CAPSULE | Freq: Once | ORAL | Status: AC
  Administered 2023-05-23: 300 mg via ORAL

## 2023-05-23 MED ORDER — PROPOFOL 10 MG/ML IV BOLUS
INTRAVENOUS | Status: AC
  Filled 2023-05-23: qty 20

## 2023-05-23 MED ORDER — TRANEXAMIC ACID-NACL 1000-0.7 MG/100ML-% IV SOLN
INTRAVENOUS | Status: AC
  Filled 2023-05-23: qty 100

## 2023-05-23 MED ORDER — ROCURONIUM BROMIDE 100 MG/10ML IV SOLN
INTRAVENOUS | Status: DC | PRN
  Administered 2023-05-23: 60 mg via INTRAVENOUS

## 2023-05-23 MED ORDER — ACETAMINOPHEN 500 MG PO TABS: 1000.0000 mg | ORAL_TABLET | Freq: Three times a day (TID) | ORAL | 0 refills | Status: AC

## 2023-05-23 MED ORDER — OXYCODONE HCL 5 MG/5ML PO SOLN: 5.0000 mg | Freq: Once | ORAL | Status: DC | PRN

## 2023-05-23 MED ORDER — PHENYLEPHRINE HCL (PRESSORS) 10 MG/ML IV SOLN
INTRAVENOUS | Status: AC
  Filled 2023-05-23: qty 1

## 2023-05-23 MED ORDER — LIDOCAINE 2% (20 MG/ML) 5 ML SYRINGE
INTRAMUSCULAR | Status: AC
  Filled 2023-05-23: qty 5

## 2023-05-23 MED ORDER — BUPIVACAINE HCL (PF) 0.5 % IJ SOLN
INTRAMUSCULAR | Status: DC | PRN
  Administered 2023-05-23: 20 mL via PERINEURAL

## 2023-05-23 MED ORDER — CELECOXIB 100 MG PO CAPS: 100.0000 mg | ORAL_CAPSULE | Freq: Two times a day (BID) | ORAL | 0 refills | Status: AC

## 2023-05-23 MED ORDER — LACTATED RINGERS IV SOLN: INTRAVENOUS | Status: DC

## 2023-05-23 MED ORDER — CEFAZOLIN SODIUM-DEXTROSE 2-4 GM/100ML-% IV SOLN
2.0000 g | INTRAVENOUS | Status: AC
  Administered 2023-05-23: 2 g via INTRAVENOUS

## 2023-05-23 MED ORDER — TRANEXAMIC ACID-NACL 1000-0.7 MG/100ML-% IV SOLN
1000.0000 mg | INTRAVENOUS | Status: AC
  Administered 2023-05-23: 1000 mg via INTRAVENOUS

## 2023-05-23 MED ORDER — ONDANSETRON HCL 4 MG/2ML IJ SOLN
INTRAMUSCULAR | Status: DC | PRN
  Administered 2023-05-23: 4 mg via INTRAVENOUS

## 2023-05-23 MED ORDER — LIDOCAINE 2% (20 MG/ML) 5 ML SYRINGE
INTRAMUSCULAR | Status: DC | PRN
  Administered 2023-05-23: 40 mg via INTRAVENOUS

## 2023-05-23 MED ORDER — OXYCODONE HCL 5 MG PO TABS: 5.0000 mg | ORAL_TABLET | Freq: Once | ORAL | Status: DC | PRN

## 2023-05-23 MED ORDER — ACETAMINOPHEN 500 MG PO TABS
1000.0000 mg | ORAL_TABLET | Freq: Once | ORAL | Status: AC
  Administered 2023-05-23: 1000 mg via ORAL

## 2023-05-23 MED ORDER — BUPIVACAINE LIPOSOME 1.3 % IJ SUSP
INTRAMUSCULAR | Status: DC | PRN
  Administered 2023-05-23: 10 mL via PERINEURAL

## 2023-05-23 MED ORDER — MIDAZOLAM HCL 2 MG/2ML IJ SOLN
2.0000 mg | Freq: Once | INTRAMUSCULAR | Status: AC
  Administered 2023-05-23: 2 mg via INTRAVENOUS

## 2023-05-23 MED ORDER — EPINEPHRINE PF 1 MG/ML IJ SOLN
INTRAMUSCULAR | Status: AC
  Filled 2023-05-23: qty 1

## 2023-05-23 MED ORDER — HYDROMORPHONE HCL 1 MG/ML IJ SOLN: 0.2500 mg | INTRAMUSCULAR | Status: DC | PRN

## 2023-05-23 MED ORDER — MEPERIDINE HCL 25 MG/ML IJ SOLN: 6.2500 mg | INTRAMUSCULAR | Status: DC | PRN

## 2023-05-23 MED ORDER — PROMETHAZINE HCL 25 MG/ML IJ SOLN: 6.2500 mg | INTRAMUSCULAR | Status: DC | PRN

## 2023-05-23 SURGICAL SUPPLY — 54 items
AID PSTN UNV HD RSTRNT DISP (MISCELLANEOUS) ×1
ANCH SUT 2 FBRTK KNTLS 1.8 (Anchor) ×2 IMPLANT
ANCHOR SUT 1.8 FIBERTAK SB KL (Anchor) IMPLANT
APL PRP STRL LF DISP 70% ISPRP (MISCELLANEOUS) ×1
BLADE EXCALIBUR 4.0X13 (MISCELLANEOUS) ×2 IMPLANT
BURR OVAL 8 FLU 4.0X13 (MISCELLANEOUS) ×2 IMPLANT
CANNULA 5.75X71 LONG (CANNULA) IMPLANT
CANNULA PASSPORT 5 (CANNULA) IMPLANT
CANNULA PASSPORT BUTTON 10-40 (CANNULA) IMPLANT
CANNULA TWIST IN 8.25X7CM (CANNULA) IMPLANT
CHLORAPREP W/TINT 26 (MISCELLANEOUS) ×2 IMPLANT
CLSR STERI-STRIP ANTIMIC 1/2X4 (GAUZE/BANDAGES/DRESSINGS) ×2 IMPLANT
COOLER ICEMAN CLASSIC (MISCELLANEOUS) ×2 IMPLANT
DRAPE IMP U-DRAPE 54X76 (DRAPES) ×2 IMPLANT
DRAPE INCISE IOBAN 66X45 STRL (DRAPES) IMPLANT
DRAPE SHOULDER BEACH CHAIR (DRAPES) ×2 IMPLANT
DW OUTFLOW CASSETTE/TUBE SET (MISCELLANEOUS) ×2 IMPLANT
GAUZE PAD ABD 8X10 STRL (GAUZE/BANDAGES/DRESSINGS) ×2 IMPLANT
GAUZE SPONGE 4X4 12PLY STRL (GAUZE/BANDAGES/DRESSINGS) ×2 IMPLANT
GLOVE BIO SURGEON STRL SZ 6.5 (GLOVE) ×2 IMPLANT
GLOVE BIOGEL PI IND STRL 6.5 (GLOVE) ×2 IMPLANT
GLOVE BIOGEL PI IND STRL 8 (GLOVE) ×2 IMPLANT
GLOVE ECLIPSE 8.0 STRL XLNG CF (GLOVE) ×2 IMPLANT
GOWN STRL REUS W/ TWL LRG LVL3 (GOWN DISPOSABLE) ×4 IMPLANT
GOWN STRL REUS W/TWL LRG LVL3 (GOWN DISPOSABLE) ×2
GOWN STRL REUS W/TWL XL LVL3 (GOWN DISPOSABLE) ×2 IMPLANT
KIT SHOULDER STAB MARCO (KITS) ×2 IMPLANT
KIT STR SPEAR 1.8 FBRTK DISP (KITS) IMPLANT
LASSO CRESCENT QUICKPASS (SUTURE) IMPLANT
MANIFOLD NEPTUNE II (INSTRUMENTS) ×2 IMPLANT
NDL HD SCORPION MEGA LOADER (NEEDLE) IMPLANT
NDL SAFETY ECLIP 18X1.5 (MISCELLANEOUS) ×2 IMPLANT
PACK ARTHROSCOPY DSU (CUSTOM PROCEDURE TRAY) ×2 IMPLANT
PACK BASIN DAY SURGERY FS (CUSTOM PROCEDURE TRAY) ×2 IMPLANT
PAD COLD SHLDR WRAP-ON (PAD) ×2 IMPLANT
PORT APPOLLO RF 90DEGREE MULTI (SURGICAL WAND) ×2 IMPLANT
RESTRAINT HEAD UNIVERSAL NS (MISCELLANEOUS) ×2 IMPLANT
SHEET MEDIUM DRAPE 40X70 STRL (DRAPES) ×2 IMPLANT
SLEEVE SCD COMPRESS KNEE MED (STOCKING) ×2 IMPLANT
SLING ARM FOAM STRAP LRG (SOFTGOODS) IMPLANT
SLING ULTRA III MED (ORTHOPEDIC SUPPLIES) IMPLANT
SUT FIBERWIRE #2 38 T-5 BLUE (SUTURE)
SUT MNCRL AB 4-0 PS2 18 (SUTURE) ×2 IMPLANT
SUT PDS AB 0 CT 36 (SUTURE) IMPLANT
SUT PDS AB 1 CT 36 (SUTURE) IMPLANT
SUT TIGER TAPE 7 IN WHITE (SUTURE) IMPLANT
SUTURE FIBERWR #2 38 T-5 BLUE (SUTURE) IMPLANT
SUTURE TAPE TIGERLINK 1.3MM BL (SUTURE) IMPLANT
SUTURETAPE TIGERLINK 1.3MM BL (SUTURE)
SYR 5ML LL (SYRINGE) ×2 IMPLANT
TAPE FIBER 2MM 7IN #2 BLUE (SUTURE) IMPLANT
TOWEL GREEN STERILE FF (TOWEL DISPOSABLE) ×4 IMPLANT
TUBE CONNECTING 20X1/4 (TUBING) ×2 IMPLANT
TUBING ARTHROSCOPY IRRIG 16FT (MISCELLANEOUS) ×2 IMPLANT

## 2023-05-23 NOTE — Transfer of Care (Signed)
Immediate Anesthesia Transfer of Care Note  Patient: Kelly Buck  Procedure(s) Performed: SHOULDER ARTHROSCOPY WITH SUBACROMIAL DECOMPRESSION AND BICEP TENDON REPAIR (Left: Shoulder) SHOULDER ARTHROSCOPY WITH DISTAL CLAVICLE EXCISION (Left: Shoulder)  Patient Location: PACU  Anesthesia Type:GA combined with regional for post-op pain  Level of Consciousness: drowsy  Airway & Oxygen Therapy: Patient Spontanous Breathing and Patient connected to face mask oxygen  Post-op Assessment: Report given to RN and Post -op Vital signs reviewed and stable  Post vital signs: Reviewed and stable  Last Vitals:  Vitals Value Taken Time  BP 148/98 05/23/23 0854  Temp    Pulse 112 05/23/23 0856  Resp 18 05/23/23 0856  SpO2 100 % 05/23/23 0856  Vitals shown include unfiled device data.  Last Pain:  Vitals:   05/23/23 0630  TempSrc: Axillary  PainSc: 9          Complications: No notable events documented.

## 2023-05-23 NOTE — Anesthesia Postprocedure Evaluation (Signed)
Anesthesia Post Note  Patient: Kelly Buck  Procedure(s) Performed: SHOULDER ARTHROSCOPY WITH SUBACROMIAL DECOMPRESSION AND BICEP TENDON REPAIR (Left: Shoulder) SHOULDER ARTHROSCOPY WITH DISTAL CLAVICLE EXCISION (Left: Shoulder)     Patient location during evaluation: PACU Anesthesia Type: General Level of consciousness: awake and alert Pain management: pain level controlled Vital Signs Assessment: post-procedure vital signs reviewed and stable Respiratory status: spontaneous breathing, nonlabored ventilation and respiratory function stable Cardiovascular status: blood pressure returned to baseline and stable Postop Assessment: no apparent nausea or vomiting Anesthetic complications: no   No notable events documented.  Last Vitals:  Vitals:   05/23/23 0915 05/23/23 0945  BP: (!) 140/94 (!) 143/86  Pulse: (!) 103 (!) 107  Resp: (!) 9 14  Temp:  36.5 C  SpO2: 98% 97%    Last Pain:  Vitals:   05/23/23 0945  TempSrc:   PainSc: 5                  Lowella Curb

## 2023-05-23 NOTE — Interval H&P Note (Signed)
All questions answered, patient wants to proceed with procedure. ? ?

## 2023-05-23 NOTE — Discharge Instructions (Addendum)
Ramond Marrow MD, MPH Alfonse Alpers, PA-C [facility] [address] [phone] (tel)   [phone] (fax)   POST-OPERATIVE INSTRUCTIONS - SHOULDER ARTHROSCOPY  WOUND CARE You may remove the Operative Dressing on Post-Op Day #3 (72hrs after surgery).   Alternatively if you would like you can leave dressing on until follow-up if within 7-8 days but keep it dry. Leave steri-strips in place until they fall off on their own, usually 2 weeks postop. There may be a small amount of fluid/bleeding leaking at the surgical site.  This is normal; the shoulder is filled with fluid during the procedure and can leak for 24-48hrs after surgery.  You may change/reinforce the bandage as needed.  Use the Cryocuff or Ice as often as possible for the first 7 days, then as needed for pain relief. Always keep a towel, ACE wrap or other barrier between the cooling unit and your skin.  You may shower on Post-Op Day #3. Gently pat the area dry.  Do not soak the shoulder in water or submerge it.  Keep incisions as dry as possible. Do not go swimming in the pool or ocean until 4 weeks after surgery or when otherwise instructed.    EXERCISES Wear the sling at all times  You may remove the sling for showering, but keep the arm across the chest or in a secondary sling.     It is normal for your fingers/hand to become more swollen after surgery and discolored from bruising.   This will resolve over the first few weeks usually after surgery. Please continue to ambulate and do not stay sitting or lying for too long.  Perform foot and wrist pumps to assist in circulation.  PHYSICAL THERAPY - You will begin physical therapy soon after surgery (unless otherwise specified) - Please call to set up an appointment, if you do not already have one  - Let our office if there are any issues with scheduling your therapy  - You have a physical therapy appointment scheduled at [facility] (across  the hall from our office) on August 5th   REGIONAL ANESTHESIA (NERVE BLOCKS) The anesthesia team may have performed a nerve block for you this is a great tool used to minimize pain.   The block may start wearing off overnight (between 8-24 hours postop) When the block wears off, your pain may go from nearly zero to the pain you would have had postop without the block. This is an abrupt transition but nothing dangerous is happening.   This can be a challenging period but utilize your as needed pain medications to try and manage this period. We suggest you use the pain medication the first night prior to going to bed, to ease this transition.  You may take an extra dose of narcotic when this happens if needed  POST-OP MEDICATIONS- Multimodal approach to pain control In general your pain will be controlled with a combination of substances.  Prescriptions unless otherwise discussed are electronically sent to your pharmacy.  This is a carefully made plan we use to minimize narcotic use.     Celebrex - Anti-inflammatory medication taken on a scheduled basis Acetaminophen - Non-narcotic pain medicine taken on a scheduled basis  Oxycodone - This is a strong narcotic, to be used only on an "as needed" basis for SEVERE pain. Zofran - take as needed for nausea   FOLLOW-UP If you develop a Fever (?101.5), Redness or Drainage from the surgical incision site, please  call our office to arrange for an evaluation. Please call the office to schedule a follow-up appointment for your first post-operative appointment, 7-10 days post-operatively.    HELPFUL INFORMATION   You may be more comfortable sleeping in a semi-seated position the first few nights following surgery.  Keep a pillow propped under the elbow and forearm for comfort.  If you have a recliner type of chair it might be beneficial.  If not that is fine too, but it would be helpful to sleep propped up with pillows behind your operated shoulder  as well under your elbow and forearm.  This will reduce pulling on the suture lines.  When dressing, put your operative arm in the sleeve first.  When getting undressed, take your operative arm out last.  Loose fitting, button-down shirts are recommended.  Often in the first days after surgery you may be more comfortable keeping your operative arm under your shirt and not through the sleeve.  You may return to work/school in the next couple of days when you feel up to it.  Desk work and typing in the sling is fine.  We suggest you use the pain medication the first night prior to going to bed, in order to ease any pain when the anesthesia wears off. You should avoid taking pain medications on an empty stomach as it will make you nauseous.  You should wean off your narcotic medicines as soon as you are able.  Most patients will be off narcotics before their first postop appointment.   Do not drink alcoholic beverages or take illicit drugs when taking pain medications.  It is against the law to drive while taking narcotics.  In some states it is against the law to drive while your arm is in a sling.   Pain medication may make you constipated.  Below are a few solutions to try in this order: Decrease the amount of pain medication if you aren't having pain. Drink lots of decaffeinated fluids. Drink prune juice and/or eat dried prunes  If the first 3 don't work start with additional solutions Take Colace - an over-the-counter stool softener Take Senokot - an over-the-counter laxative Take Miralax - a stronger over-the-counter laxative  For more information including helpful videos and documents visit our website:   [email or web address]-[email or web address]   You may have Tylenol again after 12:38pm today.  Post Anesthesia Home Care Instructions  Activity: Get plenty of rest for the remainder of the day. A responsible individual must stay with you for 24 hours following the  procedure.  For the next 24 hours, DO NOT: -Drive a car -Advertising copywriter -Drink alcoholic beverages -Take any medication unless instructed by your physician -Make any legal decisions or sign important papers.  Meals: Start with liquid foods such as gelatin or soup. Progress to regular foods as tolerated. Avoid greasy, spicy, heavy foods. If nausea and/or vomiting occur, drink only clear liquids until the nausea and/or vomiting subsides. Call your physician if vomiting continues.  Special Instructions/Symptoms: Your throat may feel dry or sore from the anesthesia or the breathing tube placed in your throat during surgery. If this causes discomfort, gargle with warm salt water. The discomfort should disappear within 24 hours.  If you had a scopolamine patch placed behind your ear for the management of post- operative nausea and/or vomiting:  1. The medication in the patch is effective for 72 hours, after which it should be removed.  Wrap patch in a tissue and discard in the trash. Wash  hands thoroughly with soap and water. 2. You may remove the patch earlier than 72 hours if you experience unpleasant side effects which may include dry mouth, dizziness or visual disturbances. 3. Avoid touching the patch. Wash your hands with soap and water after contact with the patch.   Regional Anesthesia Blocks  1. Numbness or the inability to move the "blocked" extremity may last from 3-48 hours after placement. The length of time depends on the medication injected and your individual response to the medication. If the numbness is not going away after 48 hours, call your surgeon.  2. The extremity that is blocked will need to be protected until the numbness is gone and the  Strength has returned. Because you cannot feel it, you will need to take extra care to avoid injury. Because it may be weak, you may have difficulty moving it or using it. You may not know what position it is in without looking at it  while the block is in effect.  3. For blocks in the legs and feet, returning to weight bearing and walking needs to be done carefully. You will need to wait until the numbness is entirely gone and the strength has returned. You should be able to move your leg and foot normally before you try and bear weight or walk. You will need someone to be with you when you first try to ensure you do not fall and possibly risk injury.  4. Bruising and tenderness at the needle site are common side effects and will resolve in a few days.  5. Persistent numbness or new problems with movement should be communicated to the surgeon or the [facility] [phone] [facility] [phone]).

## 2023-05-23 NOTE — Progress Notes (Signed)
Assisted Dr. Moser with left, interscalene , ultrasound guided block. Side rails up, monitors on throughout procedure. See vital signs in flow sheet. Tolerated Procedure well. 

## 2023-05-23 NOTE — Anesthesia Preprocedure Evaluation (Signed)
Anesthesia Evaluation  Patient identified by MRN, date of birth, ID band Patient awake    Reviewed: Allergy & Precautions, NPO status , Patient's Chart, lab work & pertinent test results  History of Anesthesia Complications (+) PONV and history of anesthetic complications  Airway Mallampati: I  TM Distance: >3 FB Neck ROM: Full    Dental  (+) Teeth Intact   Pulmonary former smoker   Pulmonary exam normal breath sounds clear to auscultation       Cardiovascular Exercise Tolerance: Good negative cardio ROS Normal cardiovascular exam Rhythm:Regular Rate:Normal  Hx/o palpitations   Neuro/Psych  PSYCHIATRIC DISORDERS Anxiety     negative neurological ROS     GI/Hepatic negative GI ROS, Neg liver ROS,,,  Endo/Other    Renal/GU negative Renal ROS  negative genitourinary   Musculoskeletal  (+) Arthritis , Osteoarthritis,  DDD L5-S1 Left carpal Tunnel syndrome   Abdominal  (+) + obese  Peds  Hematology negative hematology ROS (+)   Anesthesia Other Findings   Reproductive/Obstetrics Hx/o endometriosis                             Anesthesia Physical Anesthesia Plan  ASA: III  Anesthesia Plan: General   Post-op Pain Management: Regional block* and Minimal or no pain anticipated   Induction: Intravenous  PONV Risk Score and Plan: 3 and Treatment may vary due to age or medical condition, Ondansetron, Dexamethasone and Midazolam  Airway Management Planned: LMA  Additional Equipment:   Intra-op Plan:   Post-operative Plan:   Informed Consent: I have reviewed the patients History and Physical, chart, labs and discussed the procedure including the risks, benefits and alternatives for the proposed anesthesia with the patient or authorized representative who has indicated his/her understanding and acceptance.       Plan Discussed with: CRNA  Anesthesia Plan Comments:          Anesthesia Quick Evaluation

## 2023-05-23 NOTE — Anesthesia Procedure Notes (Signed)
Anesthesia Regional Block: Interscalene brachial plexus block   Pre-Anesthetic Checklist: , timeout performed,  Correct Patient, Correct Site, Correct Laterality,  Correct Procedure, Correct Position, site marked,  Risks and benefits discussed,  Surgical consent,  Pre-op evaluation,  At surgeon's request and post-op pain management  Laterality: Left  Prep: chloraprep       Needles:  Injection technique: Single-shot  Needle Type: Stimiplex     Needle Length: 9cm  Needle Gauge: 21     Additional Needles:   Procedures:,,,, ultrasound used (permanent image in chart),,    Narrative:  Start time: 05/23/2023 7:13 AM End time: 05/23/2023 7:18 AM Injection made incrementally with aspirations every 5 mL.  Performed by: Personally  Anesthesiologist: Lowella Curb, MD

## 2023-05-23 NOTE — Op Note (Signed)
Orthopaedic Surgery Operative Note (CSN: [id number])  Kelly Buck  Aug 27, 1981 Date of Surgery: 05/23/2023   DIAGNOSES: Left shoulder, SLAP tear, biceps tendinitis, AC arthritis, and subacromial impingement.  POST-OPERATIVE DIAGNOSIS: same  PROCEDURE: Arthroscopic extensive debridement - 29823 Subdeltoid Bursa, Supraspinatus Tendon, Anterior Labrum, Superior Labrum, and glenoid bone, glenoid cartilage, humeral bone Arthroscopic distal clavicle excision - 40981 Arthroscopic subacromial decompression - 19147 Arthroscopic rotator cuff repair - 82956 Arthroscopic biceps tenodesis - 21308   OPERATIVE FINDING: Exam under anesthesia: Normal Articular space:  Anterior and posterior labral tearing. Chondral surfaces: Normal Biceps:  Type II SLAP tear Subscapularis: Intact  Supraspinatus: Incomplete tear articular sided fraying about 10% of the supraspinatus.  This was debrided. Infraspinatus: Intact      Post-operative plan: The patient will be non-weightbearing in a sling for 4 weeks.  The patient will be discharged home.  DVT prophylaxis not indicated in ambulatory upper extremity patient without known risk factors.   Pain control with PRN pain medication preferring oral medicines.  Follow up plan will be scheduled in approximately 7 days for incision check and XR.  Surgeons:Primary: Bjorn Pippin, MD Assistants:Caroline McBane PA-C Location: [facility] OR ROOM 6 Anesthesia: General with Exparel interscalene block Antibiotics: Ancef 2 g Tourniquet time: None Estimated Blood Loss: Minimal Complications: None Specimens: None Implants: Implant Name Type Inv. Item Serial No. Manufacturer Lot No. LRB No. Used Action  ANCH SUT 2 FBRTK KNTLS 1.8 - [id number] Anchor ANCH SUT 2 FBRTK KNTLS 1.8  [employer] [id number] A Left 1 Implanted  ANCH SUT 2 FBRTK KNTLS 1.8 - [id number] Anchor ANCH SUT 2 FBRTK KNTLS 1.8  [employer] [id number] A Left 1 Implanted    Indications for Surgery:   Kelly Buck is a 42 y.o. female with continued shoulder pain refractory to nonoperative measures for extended period of time.    The risks and benefits were explained at length including but not limited to continued pain, cuff failure, biceps tenodesis failure, stiffness, need for further surgery and infection.   Procedure:   Patient was correctly identified in the preoperative holding area and operative site marked.  Patient brought to OR and positioned beachchair on an [place] table ensuring that all bony prominences were padded and the head was in an appropriate location.  Anesthesia was induced and the operative shoulder was prepped and draped in the usual sterile fashion.  Timeout was called preincision.  A standard posterior viewing portal was made after localizing the portal with a spinal needle.  An anterior accessory portal was also made.  After clearing the articular space the camera was positioned in the subacromial space.  Findings above.    Extensive debridement was performed of the anterior interval tissue, labral fraying and the bursa.  Glenoid bone, glenoid cartilage, humeral bone were all debrided.  Subacromial decompression: We made a lateral portal with spinal needle guidance. We then proceeded to debride bursal tissue extensively with a shaver and arthrocare device. At that point we continued to identify the borders of the acromion and identify the spur. We then carefully preserved the deltoid fascia and used a burr to convert the acromion to a Type 1 flat acromion without issue.  Biceps tenodesis: We marked the tendon and then performed a tenotomy and debridement of the stump in the articular space. We then identified the biceps tendon in its groove suprapec with the arthroscope in the lateral portal taking care to move from lateral to medial to avoid injury to the subscapularis. At that point  we unroofed the tendon itself and mobilized it. An accessory anterior portal was made in line with  the tendon and we grasped it from the anterior superior portal and worked from the accessory anterior portal. Two Fibertak 1.70mm knotless anchors were placed in the groove and the tendon was secured in a luggage loop style fashion with a pass of the limb of suture through the tendon using a scorpion device to avoid pull-through.  Repair was completed with good tension on the tendon.  Residual stump of the tendon was removed after being resected with a RF ablator.  Distal Clavicle resection:  The scope was placed in the subacromial space from the posterior portal.  A hemostat was placed through the anterior portal and we spread at the [facility] joint.  A burr was then inserted and 10 mm of distal clavicle was resected taking care to avoid damage to the capsule around the joint and avoiding overhanging bone posteriorly.      The incisions were closed with absorbable monocryl and steri strips.  A sterile dressing was placed along with a sling. The patient was awoken from general anesthesia and taken to the PACU in stable condition without complication.   Alfonse Alpers, PA-C, present and scrubbed throughout the case, critical for completion in a timely fashion, and for retraction, instrumentation, closure.

## 2023-05-23 NOTE — Anesthesia Procedure Notes (Signed)
Procedure Name: Intubation Date/Time: 05/23/2023 7:44 AM  Performed by: Lauralyn Primes, CRNAPre-anesthesia Checklist: Patient identified, Emergency Drugs available, Suction available and Patient being monitored Patient Re-evaluated:Patient Re-evaluated prior to induction Oxygen Delivery Method: Circle system utilized Preoxygenation: Pre-oxygenation with 100% oxygen Induction Type: IV induction Ventilation: Mask ventilation without difficulty Laryngoscope Size: Mac and 3 Grade View: Grade I Tube type: Oral Tube size: 7.0 mm Number of attempts: 1 Airway Equipment and Method: Stylet and Bite block Placement Confirmation: ETT inserted through vocal cords under direct vision, positive ETCO2 and breath sounds checked- equal and bilateral Secured at: 22 cm Tube secured with: Tape Dental Injury: Teeth and Oropharynx as per pre-operative assessment

## 2023-05-24 ENCOUNTER — Encounter (HOSPITAL_BASED_OUTPATIENT_CLINIC_OR_DEPARTMENT_OTHER): Payer: Self-pay | Admitting: Orthopaedic Surgery

## 2024-01-24 ENCOUNTER — Other Ambulatory Visit: Payer: Self-pay

## 2024-01-24 ENCOUNTER — Inpatient Hospital Stay (HOSPITAL_COMMUNITY)
Admission: EM | Admit: 2024-01-24 | Discharge: 2024-01-27 | DRG: 392 | Discharge status: Against Medical Advice | Attending: Surgery | Admitting: Surgery

## 2024-01-24 ENCOUNTER — Encounter (HOSPITAL_COMMUNITY): Payer: Self-pay

## 2024-01-24 ENCOUNTER — Emergency Department (HOSPITAL_COMMUNITY)

## 2024-01-24 DIAGNOSIS — Z87891 Personal history of nicotine dependence: Secondary | ICD-10-CM

## 2024-01-24 DIAGNOSIS — Z8541 Personal history of malignant neoplasm of cervix uteri: Secondary | ICD-10-CM

## 2024-01-24 DIAGNOSIS — F419 Anxiety disorder, unspecified: Secondary | ICD-10-CM | POA: Diagnosis present

## 2024-01-24 DIAGNOSIS — Z807 Family history of other malignant neoplasms of lymphoid, hematopoietic and related tissues: Secondary | ICD-10-CM

## 2024-01-24 DIAGNOSIS — R1031 Right lower quadrant pain: Principal | ICD-10-CM | POA: Diagnosis present

## 2024-01-24 DIAGNOSIS — Z9882 Breast implant status: Secondary | ICD-10-CM

## 2024-01-24 DIAGNOSIS — Z90710 Acquired absence of both cervix and uterus: Secondary | ICD-10-CM

## 2024-01-24 DIAGNOSIS — Z5329 Procedure and treatment not carried out because of patient's decision for other reasons: Secondary | ICD-10-CM | POA: Diagnosis present

## 2024-01-24 DIAGNOSIS — R109 Unspecified abdominal pain: Principal | ICD-10-CM

## 2024-01-24 LAB — COMPREHENSIVE METABOLIC PANEL WITH GFR
ALT: 19 U/L (ref 0–44)
AST: 18 U/L (ref 15–41)
Albumin: 4.2 g/dL (ref 3.5–5.0)
Alkaline Phosphatase: 34 U/L — ABNORMAL LOW (ref 38–126)
Anion gap: 10 (ref 5–15)
BUN: 13 mg/dL (ref 6–20)
CO2: 22 mmol/L (ref 22–32)
Calcium: 9.3 mg/dL (ref 8.9–10.3)
Chloride: 105 mmol/L (ref 98–111)
Creatinine, Ser: 0.91 mg/dL (ref 0.44–1.00)
GFR, Estimated: >60 mL/min (ref >60)
Glucose, Bld: 101 mg/dL — ABNORMAL HIGH (ref 70–99)
Potassium: 3.5 mmol/L (ref 3.5–5.1)
Sodium: 137 mmol/L (ref 135–145)
Total Bilirubin: 0.4 mg/dL (ref 0.0–1.2)
Total Protein: 7.3 g/dL (ref 6.5–8.1)

## 2024-01-24 LAB — URINALYSIS, ROUTINE W REFLEX MICROSCOPIC
Bilirubin Urine: NEGATIVE
Glucose, UA: NEGATIVE mg/dL
Hgb urine dipstick: NEGATIVE
Ketones, ur: NEGATIVE mg/dL
Leukocytes,Ua: NEGATIVE
Nitrite: NEGATIVE
Protein, ur: NEGATIVE mg/dL
Specific Gravity, Urine: 1.006 (ref 1.005–1.030)
pH: 6 (ref 5.0–8.0)

## 2024-01-24 LAB — CBC
HCT: 40.4 % (ref 36.0–46.0)
Hemoglobin: 13.5 g/dL (ref 12.0–15.0)
MCH: 30.7 pg (ref 26.0–34.0)
MCHC: 33.4 g/dL (ref 30.0–36.0)
MCV: 91.8 fL (ref 80.0–100.0)
Platelets: 395 10*3/uL (ref 150–400)
RBC: 4.4 MIL/uL (ref 3.87–5.11)
RDW: 12.7 % (ref 11.5–15.5)
WBC: 10.7 10*3/uL — ABNORMAL HIGH (ref 4.0–10.5)
nRBC: 0 % (ref 0.0–0.2)

## 2024-01-24 LAB — LIPASE, BLOOD: Lipase: 34 U/L (ref 11–51)

## 2024-01-24 MED ORDER — FENTANYL CITRATE PF 50 MCG/ML IJ SOSY: 100.0000 ug | PREFILLED_SYRINGE | INTRAMUSCULAR | Status: DC | PRN

## 2024-01-24 MED ORDER — IOHEXOL 300 MG/ML  SOLN
100.0000 mL | Freq: Once | INTRAMUSCULAR | Status: AC | PRN
  Administered 2024-01-24: 100 mL via INTRAVENOUS

## 2024-01-24 MED ORDER — ONDANSETRON 4 MG PO TBDP: 4.0000 mg | ORAL_TABLET | Freq: Four times a day (QID) | ORAL | Status: DC | PRN

## 2024-01-24 MED ORDER — ACETAMINOPHEN 500 MG PO TABS
1000.0000 mg | ORAL_TABLET | Freq: Four times a day (QID) | ORAL | Status: DC
  Administered 2024-01-24 – 2024-01-27 (×10): 1000 mg via ORAL
  Filled 2024-01-24 (×10): qty 2

## 2024-01-24 MED ORDER — ONDANSETRON HCL 4 MG/2ML IJ SOLN
4.0000 mg | Freq: Four times a day (QID) | INTRAMUSCULAR | Status: DC | PRN
Start: 2024-01-24 — End: 2024-01-27
  Administered 2024-01-25 – 2024-01-26 (×2): 4 mg via INTRAVENOUS
  Filled 2024-01-24 (×2): qty 2

## 2024-01-24 MED ORDER — IMIPRAMINE HCL 50 MG PO TABS
50.0000 mg | ORAL_TABLET | Freq: Every day | ORAL | Status: DC
  Administered 2024-01-24 – 2024-01-26 (×3): 50 mg via ORAL
  Filled 2024-01-24 (×4): qty 1

## 2024-01-24 MED ORDER — IMIPRAMINE PAMOATE 125 MG PO CAPS: 125.0000 mg | ORAL_CAPSULE | Freq: Every day | ORAL | Status: DC

## 2024-01-24 MED ORDER — ONDANSETRON HCL 4 MG/2ML IJ SOLN
4.0000 mg | Freq: Once | INTRAMUSCULAR | Status: AC
  Administered 2024-01-24: 4 mg via INTRAVENOUS
  Filled 2024-01-24: qty 2

## 2024-01-24 MED ORDER — FAMOTIDINE IN NACL 20-0.9 MG/50ML-% IV SOLN
20.0000 mg | Freq: Once | INTRAVENOUS | Status: AC
  Administered 2024-01-24: 20 mg via INTRAVENOUS
  Filled 2024-01-24: qty 50

## 2024-01-24 MED ORDER — VERAPAMIL HCL ER 240 MG PO TBCR
240.0000 mg | EXTENDED_RELEASE_TABLET | Freq: Every day | ORAL | Status: DC
  Administered 2024-01-24 – 2024-01-26 (×3): 240 mg via ORAL
  Filled 2024-01-24 (×4): qty 1

## 2024-01-24 MED ORDER — PIPERACILLIN-TAZOBACTAM 3.375 G IVPB 30 MIN
3.3750 g | Freq: Once | INTRAVENOUS | Status: DC
  Administered 2024-01-24: 3.375 g via INTRAVENOUS
  Filled 2024-01-24: qty 50

## 2024-01-24 MED ORDER — TRAMADOL HCL 50 MG PO TABS: 50.0000 mg | ORAL_TABLET | Freq: Four times a day (QID) | ORAL | Status: DC | PRN

## 2024-01-24 MED ORDER — ENOXAPARIN SODIUM 40 MG/0.4ML IJ SOSY
40.0000 mg | PREFILLED_SYRINGE | INTRAMUSCULAR | Status: DC
  Administered 2024-01-24 – 2024-01-26 (×3): 40 mg via SUBCUTANEOUS
  Filled 2024-01-24 (×3): qty 0.4

## 2024-01-24 MED ORDER — OXYCODONE HCL 5 MG PO TABS
5.0000 mg | ORAL_TABLET | ORAL | Status: DC | PRN
  Administered 2024-01-24 – 2024-01-27 (×8): 10 mg via ORAL
  Filled 2024-01-24 (×9): qty 2

## 2024-01-24 MED ORDER — LACTATED RINGERS IV BOLUS
1000.0000 mL | Freq: Once | INTRAVENOUS | Status: AC
  Administered 2024-01-24: 1000 mL via INTRAVENOUS

## 2024-01-24 MED ORDER — MORPHINE SULFATE (PF) 4 MG/ML IV SOLN
4.0000 mg | Freq: Once | INTRAVENOUS | Status: AC
  Administered 2024-01-24: 4 mg via INTRAVENOUS
  Filled 2024-01-24: qty 1

## 2024-01-24 MED ORDER — POTASSIUM CHLORIDE IN NACL 20-0.9 MEQ/L-% IV SOLN
INTRAVENOUS | Status: AC
  Filled 2024-01-24 (×4): qty 1000

## 2024-01-24 MED ORDER — FENTANYL CITRATE PF 50 MCG/ML IJ SOSY
100.0000 ug | PREFILLED_SYRINGE | INTRAMUSCULAR | Status: DC | PRN
  Administered 2024-01-24 (×2): 100 ug via INTRAVENOUS
  Filled 2024-01-24 (×2): qty 2

## 2024-01-24 NOTE — Progress Notes (Signed)
 Pt has arrived to unit, room 1306, via ED NT and husband at side. Pt is warm, dry, no visible distress.

## 2024-01-24 NOTE — ED Triage Notes (Signed)
 Right sided abdominal pain that started last night, abruptly stopped and started again 2 hours ago. Went to UC and sent pt here for possible appendicitis. Nausea, no vomiting. Last PO intake at 1200.

## 2024-01-24 NOTE — ED Provider Notes (Signed)
 [facility] AT [facility] Provider Note   CSN: [id number] Arrival date & time: 01/24/24  1633     History Chief Complaint  Patient presents with   Abdominal Pain    HPI Kelly Buck is a 43 y.o. female presenting for RLQ pain sionce 0200. No medical history. Hx of spinal root stimulator 2/2 back pain. Radical hysterectormy for cervical cancer. Periumbilical radiating to RLQ.  Patient's recorded medical, surgical, social, medication list and allergies were reviewed in the Snapshot window as part of the initial history.   Review of Systems   Review of Systems  Constitutional:  Negative for chills and fever.  HENT:  Negative for ear pain and sore throat.   Eyes:  Negative for pain and visual disturbance.  Respiratory:  Negative for cough and shortness of breath.   Cardiovascular:  Negative for chest pain and palpitations.  Gastrointestinal:  Positive for abdominal pain and nausea. Negative for vomiting.  Genitourinary:  Negative for dysuria and hematuria.  Musculoskeletal:  Negative for arthralgias and back pain.  Skin:  Negative for color change and rash.  Neurological:  Negative for seizures and syncope.  All other systems reviewed and are negative.   Physical Exam Updated Vital Signs BP (!) 174/101 (BP Location: Left Arm)   Pulse 67   Temp 97.7 F (36.5 C) (Oral) Comment: stated that she had something cold to drink  Resp 18   Ht 5\' 5"  (1.651 m)   Wt 95.3 kg   LMP 11/21/2020   SpO2 100%   BMI 34.95 kg/m  Physical Exam Vitals and nursing note reviewed.  Constitutional:      General: She is not in acute distress.    Appearance: She is well-developed.  HENT:     Head: Normocephalic and atraumatic.  Eyes:     Conjunctiva/sclera: Conjunctivae normal.  Cardiovascular:     Rate and Rhythm: Normal rate and regular rhythm.     Heart sounds: No murmur heard. Pulmonary:     Effort: Pulmonary effort is normal. No respiratory  distress.     Breath sounds: Normal breath sounds.  Abdominal:     General: There is no distension.     Palpations: Abdomen is soft.     Tenderness: There is abdominal tenderness in the right lower quadrant. There is guarding and rebound. There is no right CVA tenderness or left CVA tenderness. Negative signs include Murphy's sign.  Musculoskeletal:        General: No swelling or tenderness. Normal range of motion.     Cervical back: Neck supple.  Skin:    General: Skin is warm and dry.  Neurological:     General: No focal deficit present.     Mental Status: She is alert and oriented to person, place, and time. Mental status is at baseline.     Cranial Nerves: No cranial nerve deficit.      ED Course/ Medical Decision Making/ A&P    Procedures Procedures   Medications Ordered in ED Medications  verapamil (CALAN-SR) CR tablet 240 mg (240 mg Oral Given 01/24/24 2310)  enoxaparin (LOVENOX) injection 40 mg (40 mg Subcutaneous Given 01/24/24 2303)  0.9 % NaCl with KCl 20 mEq/ L  infusion ( Intravenous New Bag/Given 01/24/24 2256)  acetaminophen (TYLENOL) tablet 1,000 mg (1,000 mg Oral Given 01/24/24 2303)  traMADol (ULTRAM) tablet 50 mg (has no administration in time range)  oxyCODONE (Oxy IR/ROXICODONE) immediate release tablet 5-10 mg (10 mg Oral Given 01/24/24  2304)  fentaNYL (SUBLIMAZE) injection 100 mcg (has no administration in time range)  ondansetron (ZOFRAN-ODT) disintegrating tablet 4 mg (has no administration in time range)    Or  ondansetron (ZOFRAN) injection 4 mg (has no administration in time range)  imipramine (TOFRANIL) tablet 50 mg (50 mg Oral Given 01/24/24 2303)  lactated ringers bolus 1,000 mL (0 mLs Intravenous Stopped 01/24/24 2105)  morphine (PF) 4 MG/ML injection 4 mg (4 mg Intravenous Given 01/24/24 1756)  ondansetron (ZOFRAN) injection 4 mg (4 mg Intravenous Given 01/24/24 1756)  iohexol (OMNIPAQUE) 300 MG/ML solution 100 mL (100 mLs Intravenous Contrast Given 01/24/24  1813)  famotidine (PEPCID) IVPB 20 mg premix (0 mg Intravenous Stopped 01/24/24 1910)   Medical Decision Making:   Kelly Buck is a 43 y.o. female who presented to the ED today with abdominal pain, detailed above.    Additional history discussed with patient's family/caregivers.  Patient placed on continuous vitals and telemetry monitoring while in ED which was reviewed periodically.  Complete initial physical exam performed, notably the patient  was with severe RLQ pain and positive straight leg..     Reviewed and confirmed nursing documentation for past medical history, family history, social history.    Initial Assessment:   With the patient's presentation of abdominal pain, most likely diagnosis is appendicitis. Other diagnoses were considered including (but not limited to) gastroenteritis, colitis, small bowel obstruction, appendicitis, cholecystitis, pancreatitis, nephrolithiasis, UTI, pyleonephritis. These are considered less likely due to history of present illness and physical exam findings.   This is most consistent with an acute life/limb threatening illness complicated by underlying chronic conditions.   Initial Plan:  CBC/CMP to evaluate for underlying infectious/metabolic etiology for patient's abdominal pain  Lipase to evaluate for pancreatitis  EKG to evaluate for cardiac source of pain  CTAB/Pelvis with contrast to evaluate for structural/surgical etiology of patients' severe abdominal pain.  Urinalysis and repeat physical assessment to evaluate for UTI/Pyelonpehritis  Empiric management of symptoms with escalating pain control and antiemetics as needed.   Initial Study Results:   Laboratory  All laboratory results reviewed without evidence of clinically relevant pathology.   Exceptions include: WBC 10.7    EKG EKG was reviewed independently. Rate, rhythm, axis, intervals all examined and without medically relevant abnormality. ST segments without concerns for  elevations.    Radiology All images reviewed independently. Agree with radiology report at this time.   CT ABDOMEN PELVIS W CONTRAST Result Date: 01/24/2024 CLINICAL DATA:  RIGHT-sided abdominal pain EXAM: CT ABDOMEN AND PELVIS WITH CONTRAST TECHNIQUE: Multidetector CT imaging of the abdomen and pelvis was performed using the standard protocol following bolus administration of intravenous contrast. RADIATION DOSE REDUCTION: This exam was performed according to the departmental dose-optimization program which includes automated exposure control, adjustment of the mA and/or kV according to patient size and/or use of iterative reconstruction technique. CONTRAST:  OMNIPAQUE IOHEXOL 300 MG/ML  SOLN COMPARISON:  None Available. FINDINGS: Lower chest: Lung bases are clear. Hepatobiliary: No focal hepatic lesion. Normal gallbladder. No biliary duct dilatation. Common bile duct is normal. Pancreas: Pancreas is normal. No ductal dilatation. No pancreatic inflammation. Spleen: Normal spleen Adrenals/urinary tract: Adrenal glands and kidneys are normal. The ureters and bladder normal. Stomach/Bowel: Stomach, small-bowel and cecum are normal. The appendix is not identified but there is no pericecal inflammation to suggest appendicitis. The colon and rectosigmoid colon are normal. Vascular/Lymphatic: Abdominal aorta is normal caliber. No periportal or retroperitoneal adenopathy. No pelvic adenopathy. Reproductive: Post hysterectomy.  Adnexa  unremarkable Other: No free fluid. Musculoskeletal: No aggressive osseous lesion. Spinal cord stimulator device noted. IMPRESSION: 1. Appendix is not identified but there are no secondary signs of acute appendicitis. If continued or recurrent pain recommend follow-up CT exam. 2. Post hysterectomy.  No adnexal abnormality. 3. No obstructive uropathy. 4. Gallbladder is not distended. Electronically Signed   By: Genevive Bi M.D.   On: 01/24/2024 18:57     Consults: Case  discussed with general surgery.   Final Reassessment and Plan:   Uncertain if this is developing appendicitis or other etiology.  The story is very clinically concerning for appendicitis given the sudden onset of right lower quadrant pain and development of nausea. Unfortunately CT scan did not clearly demonstrate the appendix. Consulted general surgery for further recommendations.  They will admit to observation overnight.  Treated with Zosyn for stabilization pending general surgery recommendations. Disposition:   Based on the above findings, I believe this patient is stable for admission.    Patient/family educated about specific findings on our evaluation and explained exact reasons for admission.  Patient/family educated about clinical situation and time was allowed to answer questions.   Admission team communicated with and agreed with need for admission. Patient admitted. Patient ready to move at this time.     Emergency Department Medication Summary:   Medications  verapamil (CALAN-SR) CR tablet 240 mg (240 mg Oral Given 01/24/24 2310)  enoxaparin (LOVENOX) injection 40 mg (40 mg Subcutaneous Given 01/24/24 2303)  0.9 % NaCl with KCl 20 mEq/ L  infusion ( Intravenous New Bag/Given 01/24/24 2256)  acetaminophen (TYLENOL) tablet 1,000 mg (1,000 mg Oral Given 01/24/24 2303)  traMADol (ULTRAM) tablet 50 mg (has no administration in time range)  oxyCODONE (Oxy IR/ROXICODONE) immediate release tablet 5-10 mg (10 mg Oral Given 01/24/24 2304)  fentaNYL (SUBLIMAZE) injection 100 mcg (has no administration in time range)  ondansetron (ZOFRAN-ODT) disintegrating tablet 4 mg (has no administration in time range)    Or  ondansetron (ZOFRAN) injection 4 mg (has no administration in time range)  imipramine (TOFRANIL) tablet 50 mg (50 mg Oral Given 01/24/24 2303)  lactated ringers bolus 1,000 mL (0 mLs Intravenous Stopped 01/24/24 2105)  morphine (PF) 4 MG/ML injection 4 mg (4 mg Intravenous Given 01/24/24 1756)   ondansetron (ZOFRAN) injection 4 mg (4 mg Intravenous Given 01/24/24 1756)  iohexol (OMNIPAQUE) 300 MG/ML solution 100 mL (100 mLs Intravenous Contrast Given 01/24/24 1813)  famotidine (PEPCID) IVPB 20 mg premix (0 mg Intravenous Stopped 01/24/24 1910)            Clinical Impression:  1. Abdominal pain, unspecified abdominal location      Admit   Final Clinical Impression(s) / ED Diagnoses Final diagnoses:  Abdominal pain, unspecified abdominal location    Rx / DC Orders ED Discharge Orders     None         Glyn Ade, MD 01/24/24 2342

## 2024-01-24 NOTE — H&P (Signed)
 Kelly Buck is an 43 y.o. female.   Chief Complaint: Abdominal pain HPI: This is a 43 year old female who presents with abdominal pain.  She reports she had the sudden onset of sharp periumbilical pain that awoken her from her sleep at 2 AM this morning.  She has had some nausea but no emesis.  She reports now the pain is more to the right lower quadrant.  She has had no previous history of similar pain.  She has had a laparoscopic radical hysterectomy with removal of the right ovary for cervical cancer.  She did not have radiation therapy.  Bowel movements have been normal.  She underwent a CT scan of the abdomen and pelvis.  The appendix could not be visualized.  There was no stranding in the right lower quadrant and no other intra-abdominal abnormalities.  Of interest, she had a CT scan in 2017 where the appendix cannot be visualized as well.  She denies any other abdominal surgery other than an abdominoplasty.  She denies fevers or chills.  Again, she describes the pain as sharp and intermittent and somewhat worse with motion  Past Medical History:  Diagnosis Date   Anxiety    Complication of anesthesia    Endometriosis    H/O spinal cord injury    Migraines    Palpitations    PONV (postoperative nausea and vomiting)    S/P insertion of spinal cord stimulator    Vaginal Pap smear, abnormal     Past Surgical History:  Procedure Laterality Date   ABDOMINOPLASTY     CARPAL TUNNEL RELEASE Right    CARPAL TUNNEL RELEASE Left 06/02/2020   Procedure: LEFT CARPAL TUNNEL RELEASE;  Surgeon: Betha Loa, MD;  Location: [facility];  Service: Orthopedics;  Laterality: Left;  Bier block   CERVICAL BIOPSY     DILATION AND EVACUATION N/A 06/01/2014   Procedure: DILATATION AND EVACUATION;  Surgeon: Loney Laurence, MD;  Location: [facility];  Service: Gynecology;  Laterality: N/A;   DILATION AND EVACUATION N/A 06/04/2014   Procedure: DILATATION AND EVACUATION;  Surgeon: Loney Laurence, MD;  Location: [facility];  Service: Gynecology;  Laterality: N/A;   LUMBAR LAMINECTOMY/DECOMPRESSION MICRODISCECTOMY Right 10/31/2016   Procedure: RIGHT SIDED LUMBAR 5-SACRUM 1 MICRODISCECTOMY;  Surgeon: Estill Bamberg, MD;  Location: [facility];  Service: Orthopedics;  Laterality: Right;  RIGHT SIDED LUMBAR 5-SACRUM 1 MICRODISCECTOMY   PLACEMENT OF BREAST IMPLANTS     SHOULDER ARTHROSCOPY WITH DISTAL CLAVICLE RESECTION Left 05/23/2023   Procedure: SHOULDER ARTHROSCOPY WITH DISTAL CLAVICLE EXCISION;  Surgeon: Bjorn Pippin, MD;  Location: [facility];  Service: Orthopedics;  Laterality: Left;   SHOULDER ARTHROSCOPY WITH SUBACROMIAL DECOMPRESSION AND BICEP TENDON REPAIR Left 05/23/2023   Procedure: SHOULDER ARTHROSCOPY WITH SUBACROMIAL DECOMPRESSION AND BICEP TENDON REPAIR;  Surgeon: Bjorn Pippin, MD;  Location: [facility];  Service: Orthopedics;  Laterality: Left;    Family History  Problem Relation Age of Onset   Healthy Mother    Carpal tunnel syndrome Mother    Healthy Father    Healthy Brother    Healthy Brother    Hodgkin's lymphoma Maternal Grandmother    Lupus Paternal Grandmother    Dementia Paternal Grandmother    Fibromyalgia Paternal Grandmother    Social History:  reports that she has quit smoking. Her smoking use included cigarettes. She has a 1 pack-year smoking history. She has never used smokeless tobacco. She reports current alcohol use. She reports that  she does not use drugs.  Allergies: No Known Allergies  (Not in a hospital admission)   Results for orders placed or performed during the hospital encounter of 01/24/24 (from the past 48 hours)  Lipase, blood     Status: None   Collection Time: 01/24/24  5:04 PM  Result Value Ref Range   Lipase 34 11 - 51 U/L    Comment: Performed at [facility], [address], [place], [place] [address]  Comprehensive metabolic panel     Status: Abnormal   Collection Time: 01/24/24   5:04 PM  Result Value Ref Range   Sodium 137 135 - 145 mmol/L   Potassium 3.5 3.5 - 5.1 mmol/L   Chloride 105 98 - 111 mmol/L   CO2 22 22 - 32 mmol/L   Glucose, Bld 101 (H) 70 - 99 mg/dL    Comment: Glucose reference range applies only to samples taken after fasting for at least 8 hours.   BUN 13 6 - 20 mg/dL   Creatinine, Ser 9.81 0.44 - 1.00 mg/dL   Calcium 9.3 8.9 - 19.1 mg/dL   Total Protein 7.3 6.5 - 8.1 g/dL   Albumin 4.2 3.5 - 5.0 g/dL   AST 18 15 - 41 U/L   ALT 19 0 - 44 U/L   Alkaline Phosphatase 34 (L) 38 - 126 U/L   Total Bilirubin 0.4 0.0 - 1.2 mg/dL   GFR, Estimated >47 >82 mL/min    Comment: (NOTE) Calculated using the CKD-EPI Creatinine Equation (2021)    Anion gap 10 5 - 15    Comment: Performed at [facility], [address], [place], [place] [address]  CBC     Status: Abnormal   Collection Time: 01/24/24  5:04 PM  Result Value Ref Range   WBC 10.7 (H) 4.0 - 10.5 K/uL   RBC 4.40 3.87 - 5.11 MIL/uL   Hemoglobin 13.5 12.0 - 15.0 g/dL   HCT 30.8 65.7 - 84.6 %   MCV 91.8 80.0 - 100.0 fL   MCH 30.7 26.0 - 34.0 pg   MCHC 33.4 30.0 - 36.0 g/dL   RDW 96.2 95.2 - 84.1 %   Platelets 395 150 - 400 K/uL   nRBC 0.0 0.0 - 0.2 %    Comment: Performed at [facility], [address], [place], [place] [address]  Urinalysis, Routine w reflex microscopic -Urine, Clean Catch     Status: Abnormal   Collection Time: 01/24/24  6:00 PM  Result Value Ref Range   Color, Urine STRAW (A) YELLOW   APPearance CLEAR CLEAR   Specific Gravity, Urine 1.006 1.005 - 1.030   pH 6.0 5.0 - 8.0   Glucose, UA NEGATIVE NEGATIVE mg/dL   Hgb urine dipstick NEGATIVE NEGATIVE   Bilirubin Urine NEGATIVE NEGATIVE   Ketones, ur NEGATIVE NEGATIVE mg/dL   Protein, ur NEGATIVE NEGATIVE mg/dL   Nitrite NEGATIVE NEGATIVE   Leukocytes,Ua NEGATIVE NEGATIVE    Comment: Performed at [facility], [address] [place], [place] [address]   CT  ABDOMEN PELVIS W CONTRAST Result Date: 01/24/2024 CLINICAL DATA:  RIGHT-sided abdominal pain EXAM: CT ABDOMEN AND PELVIS WITH CONTRAST TECHNIQUE: Multidetector CT imaging of the abdomen and pelvis was performed using the standard protocol following bolus administration of intravenous contrast. RADIATION DOSE REDUCTION: This exam was performed according to the departmental dose-optimization program which includes automated exposure control, adjustment of the mA and/or kV according to patient size and/or use  of iterative reconstruction technique. CONTRAST:  OMNIPAQUE IOHEXOL 300 MG/ML  SOLN COMPARISON:  None Available. FINDINGS: Lower chest: Lung bases are clear. Hepatobiliary: No focal hepatic lesion. Normal gallbladder. No biliary duct dilatation. Common bile duct is normal. Pancreas: Pancreas is normal. No ductal dilatation. No pancreatic inflammation. Spleen: Normal spleen Adrenals/urinary tract: Adrenal glands and kidneys are normal. The ureters and bladder normal. Stomach/Bowel: Stomach, small-bowel and cecum are normal. The appendix is not identified but there is no pericecal inflammation to suggest appendicitis. The colon and rectosigmoid colon are normal. Vascular/Lymphatic: Abdominal aorta is normal caliber. No periportal or retroperitoneal adenopathy. No pelvic adenopathy. Reproductive: Post hysterectomy.  Adnexa unremarkable Other: No free fluid. Musculoskeletal: No aggressive osseous lesion. Spinal cord stimulator device noted. IMPRESSION: 1. Appendix is not identified but there are no secondary signs of acute appendicitis. If continued or recurrent pain recommend follow-up CT exam. 2. Post hysterectomy.  No adnexal abnormality. 3. No obstructive uropathy. 4. Gallbladder is not distended. Electronically Signed   By: Genevive Bi M.D.   On: 01/24/2024 18:57    Review of Systems  All other systems reviewed and are negative.   Blood pressure (!) 140/98, pulse 90, temperature 97.9 F (36.6  C), temperature source Oral, resp. rate 18, height 5\' 5"  (1.651 m), weight 95.3 kg, last menstrual period 11/21/2020, SpO2 99%. Physical Exam Constitutional:      General: She is not in acute distress.    Appearance: She is well-developed. She is not toxic-appearing.  HENT:     Head: Normocephalic and atraumatic.  Eyes:     General: No scleral icterus. Cardiovascular:     Rate and Rhythm: Normal rate and regular rhythm.  Pulmonary:     Effort: Pulmonary effort is normal. No respiratory distress.  Abdominal:     Comments: Abdomen is obese but soft.  She has multiple well-healed incisions.  There is tenderness in the entire right abdomen.  There are no palpable masses and no hernias.  The tenderness is mild to moderate..  There is no frank peritonitis  Skin:    General: Skin is warm and dry.     Coloration: Skin is not jaundiced.  Neurological:     General: No focal deficit present.     Mental Status: She is alert.  Psychiatric:        Mood and Affect: Mood normal.        Behavior: Behavior normal.      Assessment/Plan Right lower quadrant pain of uncertain etiology  I have extensively reviewed the CT scan of her abdomen and pelvis.  Again, the appendix is not visualized.  She has a moderate amount of intra-abdominal fat and there is no inflammatory changes or fat stranding.  There is no dilation of the bowel and there is no free fluid.  The CT scan report from 2017 again shows the appendix cannot be visualized.  I reviewed the pathology report of her hysterectomy and there was no mention of the appendectomy.  Given the CT scan findings and her near normal white blood count, I have doubts this is acute appendicitis.  This may actually be secondary to adhesions or developing partial obstruction.  I recommend admission to the hospital for IV fluids.  She has had 1 dose of IV antibiotics and I would not recommend any further at this point.  If she is still having similar discomfort  tomorrow, we would either proceed with a second CT scan of the abdomen and pelvis or consider a  diagnostic laparoscopy.  We will repeat her laboratory data in the morning as well.  She understands and agrees with plans.  Moderately complex medical decision making  Abigail Miyamoto, MD 01/24/2024, 8:09 PM

## 2024-01-25 ENCOUNTER — Observation Stay (HOSPITAL_COMMUNITY)

## 2024-01-25 LAB — CBC
HCT: 40.2 % (ref 36.0–46.0)
Hemoglobin: 12.9 g/dL (ref 12.0–15.0)
MCH: 30.4 pg (ref 26.0–34.0)
MCHC: 32.1 g/dL (ref 30.0–36.0)
MCV: 94.8 fL (ref 80.0–100.0)
Platelets: 336 10*3/uL (ref 150–400)
RBC: 4.24 MIL/uL (ref 3.87–5.11)
RDW: 12.7 % (ref 11.5–15.5)
WBC: 10 10*3/uL (ref 4.0–10.5)
nRBC: 0 % (ref 0.0–0.2)

## 2024-01-25 LAB — HIV ANTIBODY (ROUTINE TESTING W REFLEX): HIV Screen 4th Generation wRfx: NONREACTIVE

## 2024-01-25 LAB — BASIC METABOLIC PANEL WITH GFR
Anion gap: 9 (ref 5–15)
BUN: 9 mg/dL (ref 6–20)
CO2: 22 mmol/L (ref 22–32)
Calcium: 8.5 mg/dL — ABNORMAL LOW (ref 8.9–10.3)
Chloride: 105 mmol/L (ref 98–111)
Creatinine, Ser: 0.6 mg/dL (ref 0.44–1.00)
GFR, Estimated: >60 mL/min (ref >60)
Glucose, Bld: 102 mg/dL — ABNORMAL HIGH (ref 70–99)
Potassium: 4 mmol/L (ref 3.5–5.1)
Sodium: 136 mmol/L (ref 135–145)

## 2024-01-25 MED ORDER — IOHEXOL 300 MG/ML  SOLN
100.0000 mL | Freq: Once | INTRAMUSCULAR | Status: AC | PRN
  Administered 2024-01-25: 100 mL via INTRAVENOUS

## 2024-01-25 MED ORDER — SODIUM CHLORIDE (PF) 0.9 % IJ SOLN
INTRAMUSCULAR | Status: AC
  Filled 2024-01-25: qty 50

## 2024-01-25 MED ORDER — IOHEXOL 9 MG/ML PO SOLN
1000.0000 mL | Freq: Once | ORAL | Status: AC
  Administered 2024-01-25: 1000 mL via ORAL

## 2024-01-25 NOTE — TOC Initial Note (Signed)
 Transition of Care [facility]) - Initial/Assessment Note    Patient Details  Name: Kelly Buck MRN: [id number] Date of Birth: 09/20/1981  Transition of [facility]) CM/SW Contact:    Adrian Prows, RN Phone Number: 01/25/2024, 2:11 PM  Clinical Narrative:                 No PCP listed; spoke w/ pt in room; pt says she lives at home w/ her spouse Kelly Buck [phone]); she plans to return at d/c; pt says her husband will provide transportation; she verified insurance; pt says she has an upcoming appt w/ her new PCP Kelly Buck in [place], [place]; she denied SDOH risks; pt says she does not have DME, HH services, or home oxygen; TOC will follow.  Expected Discharge Plan: Home/Self Care Barriers to Discharge: Continued Medical Work up   Patient Goals and CMS Choice Patient states their goals for this hospitalization and ongoing recovery are:: home          Expected Discharge Plan and Services   Discharge Planning Services: CM Consult   Living arrangements for the past 2 months: Single Family Home                                      Prior Living Arrangements/Services Living arrangements for the past 2 months: Single Family Home Lives with:: Spouse Patient language and need for interpreter reviewed:: Yes Do you feel safe going back to the place where you live?: Yes      Need for Family Participation in Patient Care: Yes (Comment) Care giver support system in place?: Yes (comment)   Criminal Activity/Legal Involvement Pertinent to Current Situation/Hospitalization: No - Comment as needed  Activities of Daily Living   ADL Screening (condition at time of admission) Independently performs ADLs?: Yes (appropriate for developmental age) Is the patient deaf or have difficulty hearing?: No Does the patient have difficulty seeing, even when wearing glasses/contacts?: No Does the patient have difficulty concentrating, remembering, or making decisions?:  No  Permission Sought/Granted Permission sought to share information with : Case Manager Permission granted to share information with : Yes, Verbal Permission Granted  Share Information with NAME: Case Manager     Permission granted to share info w Relationship: Kelly Buck (spouse) [phone]     Emotional Assessment Appearance:: Appears stated age Attitude/Demeanor/Rapport: Gracious Affect (typically observed): Accepting Orientation: : Oriented to Self, Oriented to Place, Oriented to  Time, Oriented to Situation Alcohol / Substance Use: Not Applicable Psych Involvement: No (comment)  Admission diagnosis:  RLQ abdominal pain [R10.31] Abdominal pain, unspecified abdominal location [R10.9] Patient Active Problem List   Diagnosis Date Noted   RLQ abdominal pain 01/24/2024   Degenerative disc disease at L5-S1 level 07/25/2017   Postpartum endometritis 06/04/2014   Retained products of conception after delivery without hemorrhage 06/01/2014   Postpartum state 05/21/2014   Oligohydramnios 05/20/2014   Antepartum non-reassuring fetal heart rate or rhythm affecting care of mother 04/28/2014   PCP:  Patient, No Pcp Per Pharmacy:   [facility] - [place], [place] - [address] [place] [address] Phone: [phone] Fax: [phone]     Social Drivers of Health (SDOH) Social History: SDOH Screenings   Food Insecurity: No Food Insecurity (01/25/2024)  Housing: Low Risk  (01/25/2024)  Transportation Needs: No Transportation Needs (01/25/2024)  Utilities: Not At Risk (01/25/2024)  Depression (  PHQ2-9): Low Risk  (08/14/2019)  Tobacco Use: Medium Risk (01/24/2024)   SDOH Interventions: Food Insecurity Interventions: Intervention Not Indicated, Inpatient TOC Housing Interventions: Intervention Not Indicated, Inpatient TOC Transportation Interventions: Intervention Not Indicated, Inpatient TOC Utilities Interventions: Intervention Not Indicated,  Inpatient TOC   Readmission Risk Interventions     No data to display

## 2024-01-25 NOTE — Progress Notes (Signed)
 Subjective/Chief Complaint: Continues to have RLQ pain when pain meds wear off.  Mild nausea.     Objective: Vital signs in last 24 hours: Temp:  [97.7 F (36.5 C)-98.3 F (36.8 C)] 98.2 F (36.8 C) (04/05 0642) Pulse Rate:  [67-110] 78 (04/05 0642) Resp:  [18] 18 (04/05 0642) BP: (122-180)/(94-130) 122/94 (04/05 0642) SpO2:  [99 %-100 %] 99 % (04/05 0642) Weight:  [95.3 kg] 95.3 kg (04/04 1639) Last BM Date : 01/24/24  Intake/Output from previous day: 04/04 0701 - 04/05 0700 In: 1047.1 [P.O.:120; I.V.:927.1] Out: -  Intake/Output this shift: Total I/O In: 279.7 [I.V.:279.7] Out: -   General appearance: alert, cooperative, and looks uncomfortable Resp: breathing comfortably GI: soft, non distended. Mild RLQ tenderness Extremities: extremities normal, atraumatic, no cyanosis or edema  Lab Results:  Recent Labs    01/24/24 1704 01/25/24 0457  WBC 10.7* 10.0  HGB 13.5 12.9  HCT 40.4 40.2  PLT 395 336   BMET Recent Labs    01/24/24 1704 01/25/24 0457  NA 137 136  K 3.5 4.0  CL 105 105  CO2 22 22  GLUCOSE 101* 102*  BUN 13 9  CREATININE 0.91 0.60  CALCIUM 9.3 8.5*   PT/INR No results for input(s): "LABPROT", "INR" in the last 72 hours. ABG No results for input(s): "PHART", "HCO3" in the last 72 hours.  Invalid input(s): "PCO2", "PO2"  Studies/Results: CT ABDOMEN PELVIS W CONTRAST Result Date: 01/24/2024 CLINICAL DATA:  RIGHT-sided abdominal pain EXAM: CT ABDOMEN AND PELVIS WITH CONTRAST TECHNIQUE: Multidetector CT imaging of the abdomen and pelvis was performed using the standard protocol following bolus administration of intravenous contrast. RADIATION DOSE REDUCTION: This exam was performed according to the departmental dose-optimization program which includes automated exposure control, adjustment of the mA and/or kV according to patient size and/or use of iterative reconstruction technique. CONTRAST:  OMNIPAQUE IOHEXOL 300 MG/ML  SOLN  COMPARISON:  None Available. FINDINGS: Lower chest: Lung bases are clear. Hepatobiliary: No focal hepatic lesion. Normal gallbladder. No biliary duct dilatation. Common bile duct is normal. Pancreas: Pancreas is normal. No ductal dilatation. No pancreatic inflammation. Spleen: Normal spleen Adrenals/urinary tract: Adrenal glands and kidneys are normal. The ureters and bladder normal. Stomach/Bowel: Stomach, small-bowel and cecum are normal. The appendix is not identified but there is no pericecal inflammation to suggest appendicitis. The colon and rectosigmoid colon are normal. Vascular/Lymphatic: Abdominal aorta is normal caliber. No periportal or retroperitoneal adenopathy. No pelvic adenopathy. Reproductive: Post hysterectomy.  Adnexa unremarkable Other: No free fluid. Musculoskeletal: No aggressive osseous lesion. Spinal cord stimulator device noted. IMPRESSION: 1. Appendix is not identified but there are no secondary signs of acute appendicitis. If continued or recurrent pain recommend follow-up CT exam. 2. Post hysterectomy.  No adnexal abnormality. 3. No obstructive uropathy. 4. Gallbladder is not distended. Electronically Signed   By: Genevive Bi M.D.   On: 01/24/2024 18:57    Anti-infectives: Anti-infectives (From admission, onward)    Start     Dose/Rate Route Frequency Ordered Stop   01/24/24 2000  piperacillin-tazobactam (ZOSYN) IVPB 3.375 g  Status:  Discontinued        3.375 g 100 mL/hr over 30 Minutes Intravenous  Once 01/24/24 1959 01/24/24 2139       Assessment/Plan: s/p * No surgery found * Acute RLQ abd pain.   Non visualization of the appendix, but no inflammatory changes. Appendix is not visualized in imaging going back to 2015.   Afebrile, WBCs 10 (was 10.7 yesterday and  got 1 dose zosyn).  Repeat CT abd/pelvis with oral contrast.  Reluctant to take to OR in setting of no abnormal findings other than non vis of appendix.    Patient still has left ovary.  H/o  endometriosis with implants seen at lap hyst 2023.      LOS: 0 days    Almond Lint 01/25/2024

## 2024-01-25 NOTE — Plan of Care (Signed)
   Problem: Education: Goal: Knowledge of General Education information will improve Description: Including pain rating scale, medication(s)/side effects and non-pharmacologic comfort measures Outcome: Progressing   Problem: Coping: Goal: Level of anxiety will decrease Outcome: Progressing

## 2024-01-26 LAB — BASIC METABOLIC PANEL WITH GFR
Anion gap: 8 (ref 5–15)
BUN: 7 mg/dL (ref 6–20)
CO2: 22 mmol/L (ref 22–32)
Calcium: 8.6 mg/dL — ABNORMAL LOW (ref 8.9–10.3)
Chloride: 105 mmol/L (ref 98–111)
Creatinine, Ser: 0.67 mg/dL (ref 0.44–1.00)
GFR, Estimated: >60 mL/min (ref >60)
Glucose, Bld: 98 mg/dL (ref 70–99)
Potassium: 4.7 mmol/L (ref 3.5–5.1)
Sodium: 135 mmol/L (ref 135–145)

## 2024-01-26 LAB — CBC
HCT: 41.4 % (ref 36.0–46.0)
Hemoglobin: 13.7 g/dL (ref 12.0–15.0)
MCH: 30.7 pg (ref 26.0–34.0)
MCHC: 33.1 g/dL (ref 30.0–36.0)
MCV: 92.8 fL (ref 80.0–100.0)
Platelets: 298 10*3/uL (ref 150–400)
RBC: 4.46 MIL/uL (ref 3.87–5.11)
RDW: 12.6 % (ref 11.5–15.5)
WBC: 8.5 10*3/uL (ref 4.0–10.5)
nRBC: 0 % (ref 0.0–0.2)

## 2024-01-26 MED ORDER — ACETAMINOPHEN 500 MG PO TABS: 1000.0000 mg | ORAL_TABLET | Freq: Four times a day (QID) | ORAL | 0 refills | Status: AC

## 2024-01-26 MED ORDER — OXYCODONE HCL 5 MG PO TABS
5.0000 mg | ORAL_TABLET | Freq: Four times a day (QID) | ORAL | 0 refills | Status: AC | PRN
Start: 2024-01-26 — End: ?

## 2024-01-26 MED ORDER — ONDANSETRON 4 MG PO TBDP: 4.0000 mg | ORAL_TABLET | Freq: Four times a day (QID) | ORAL | 0 refills | Status: DC | PRN

## 2024-01-26 MED ORDER — METHOCARBAMOL 500 MG PO TABS: 750.0000 mg | ORAL_TABLET | Freq: Four times a day (QID) | ORAL | 0 refills | Status: AC | PRN

## 2024-01-26 NOTE — Discharge Instructions (Signed)
 Diet as tolerated.  Ambulate. Add stool softener and have laxatives on hand as pain medication will make constipation occur.

## 2024-01-26 NOTE — Progress Notes (Signed)
 Dr. Magnus Ivan aware via phone that pt had nausea after advancement to full liquid diet. Instructed pt to just sip clears going forward. No new orders.

## 2024-01-26 NOTE — Plan of Care (Signed)

## 2024-01-26 NOTE — Progress Notes (Addendum)
 Subjective/Chief Complaint: Slight improvement in pain. Passing gas. Repeat CT with contrast negative for appendicitis. Didn't need any IV medication overnight.    Objective: Vital signs in last 24 hours: Temp:  [98.5 F (36.9 C)] 98.5 F (36.9 C) (04/06 0535) Pulse Rate:  [66-70] 70 (04/06 0535) Resp:  [18-20] 20 (04/06 0535) BP: (132-143)/(83-92) 132/83 (04/06 0535) SpO2:  [97 %-100 %] 97 % (04/06 0535) Last BM Date : 01/24/24  Intake/Output from previous day: 04/05 0701 - 04/06 0700 In: 3157 [P.O.:1320; I.V.:1837] Out: -  Intake/Output this shift: Total I/O In: 240 [P.O.:240] Out: -   General appearance: alert, cooperative, and mild distress Resp: breathing comfortably GI: soft, much less tender RLQ. Much less distended.  Extremities: extremities normal, atraumatic, no cyanosis or edema  Lab Results:  Recent Labs    01/25/24 0457 01/26/24 0835  WBC 10.0 8.5  HGB 12.9 13.7  HCT 40.2 41.4  PLT 336 298   BMET Recent Labs    01/25/24 0457 01/26/24 0835  NA 136 135  K 4.0 4.7  CL 105 105  CO2 22 22  GLUCOSE 102* 98  BUN 9 7  CREATININE 0.60 0.67  CALCIUM 8.5* 8.6*   PT/INR No results for input(s): "LABPROT", "INR" in the last 72 hours. ABG No results for input(s): "PHART", "HCO3" in the last 72 hours.  Invalid input(s): "PCO2", "PO2"  Studies/Results: CT ABDOMEN PELVIS W CONTRAST Result Date: 01/25/2024 CLINICAL DATA:  Right lower quadrant pain. EXAM: CT ABDOMEN AND PELVIS WITH CONTRAST TECHNIQUE: Multidetector CT imaging of the abdomen and pelvis was performed using the standard protocol following bolus administration of intravenous contrast. RADIATION DOSE REDUCTION: This exam was performed according to the departmental dose-optimization program which includes automated exposure control, adjustment of the mA and/or kV according to patient size and/or use of iterative reconstruction technique. CONTRAST:  OMNIPAQUE IOHEXOL 300 MG/ML  SOLN COMPARISON:   01/24/2024 and chest CTA on 12/06/2020 FINDINGS: Lower Chest: No acute findings. Stable tiny 5 mm pulmonary nodule in the medial right lower lobe, consistent with benign etiology. Hepatobiliary: No suspicious hepatic masses identified. Mild diffuse hepatic steatosis. Probable tiny gallstones along the dependent gallbladder wall. No signs of cholecystitis or biliary ductal dilatation. Pancreas:  No mass or inflammatory changes. Spleen: Within normal limits in size and appearance. Adrenals/Urinary Tract: No suspicious masses identified. No evidence of ureteral calculi or hydronephrosis. Stomach/Bowel: No evidence of obstruction, inflammatory process or abnormal fluid collections. Although the appendix is not directly visualized, no inflammatory process seen in region of the cecum or elsewhere. Vascular/Lymphatic: No pathologically enlarged lymph nodes. No acute vascular findings. Reproductive: Prior hysterectomy. No pelvic mass identified. Tiny amount of free fluid incidentally noted. Other:  None. Musculoskeletal: No suspicious bone lesions identified. Neurostimulator device seen in the left buttock, with leads in the thoracic spinal canal. IMPRESSION: No evidence of appendicitis or other acute findings. Suspect tiny gallstones. No radiographic evidence of cholecystitis or biliary ductal dilatation. Mild hepatic steatosis. Electronically Signed   By: Danae Orleans M.D.   On: 01/25/2024 15:48   CT ABDOMEN PELVIS W CONTRAST Result Date: 01/24/2024 CLINICAL DATA:  RIGHT-sided abdominal pain EXAM: CT ABDOMEN AND PELVIS WITH CONTRAST TECHNIQUE: Multidetector CT imaging of the abdomen and pelvis was performed using the standard protocol following bolus administration of intravenous contrast. RADIATION DOSE REDUCTION: This exam was performed according to the departmental dose-optimization program which includes automated exposure control, adjustment of the mA and/or kV according to patient size and/or use of iterative  reconstruction technique. CONTRAST:  OMNIPAQUE IOHEXOL 300 MG/ML  SOLN COMPARISON:  None Available. FINDINGS: Lower chest: Lung bases are clear. Hepatobiliary: No focal hepatic lesion. Normal gallbladder. No biliary duct dilatation. Common bile duct is normal. Pancreas: Pancreas is normal. No ductal dilatation. No pancreatic inflammation. Spleen: Normal spleen Adrenals/urinary tract: Adrenal glands and kidneys are normal. The ureters and bladder normal. Stomach/Bowel: Stomach, small-bowel and cecum are normal. The appendix is not identified but there is no pericecal inflammation to suggest appendicitis. The colon and rectosigmoid colon are normal. Vascular/Lymphatic: Abdominal aorta is normal caliber. No periportal or retroperitoneal adenopathy. No pelvic adenopathy. Reproductive: Post hysterectomy.  Adnexa unremarkable Other: No free fluid. Musculoskeletal: No aggressive osseous lesion. Spinal cord stimulator device noted. IMPRESSION: 1. Appendix is not identified but there are no secondary signs of acute appendicitis. If continued or recurrent pain recommend follow-up CT exam. 2. Post hysterectomy.  No adnexal abnormality. 3. No obstructive uropathy. 4. Gallbladder is not distended. Electronically Signed   By: Genevive Bi M.D.   On: 01/24/2024 18:57    Anti-infectives: Anti-infectives (From admission, onward)    Start     Dose/Rate Route Frequency Ordered Stop   01/24/24 2000  piperacillin-tazobactam (ZOSYN) IVPB 3.375 g  Status:  Discontinued        3.375 g 100 mL/hr over 30 Minutes Intravenous  Once 01/24/24 1959 01/24/24 2139       Assessment/Plan: s/p * No surgery found * RLQ pain Unclear cause. WBCs normal.  Does not appear to be appendicitis Appendix is not visualized, but hasn't been seen on CT scans as far back as 2015 when no RLQ pain was present.  No inflammation. Suspect endometriosis or pelvic floor spasm.   Start advancing diet. If continuing to improve, possible d/c  this afternoon.    LOS: 0 days    Almond Lint 01/26/2024

## 2024-01-26 NOTE — Progress Notes (Signed)
 Pt is up to unit via stretcher, from the ED, with ED NT at side. Pt appears warm, dry,no visible distress.

## 2024-01-27 DIAGNOSIS — Z87891 Personal history of nicotine dependence: Secondary | ICD-10-CM | POA: Diagnosis not present

## 2024-01-27 DIAGNOSIS — F419 Anxiety disorder, unspecified: Secondary | ICD-10-CM | POA: Diagnosis present

## 2024-01-27 DIAGNOSIS — R1031 Right lower quadrant pain: Secondary | ICD-10-CM | POA: Diagnosis present

## 2024-01-27 DIAGNOSIS — Z90710 Acquired absence of both cervix and uterus: Secondary | ICD-10-CM | POA: Diagnosis not present

## 2024-01-27 DIAGNOSIS — Z807 Family history of other malignant neoplasms of lymphoid, hematopoietic and related tissues: Secondary | ICD-10-CM | POA: Diagnosis not present

## 2024-01-27 DIAGNOSIS — Z9882 Breast implant status: Secondary | ICD-10-CM | POA: Diagnosis not present

## 2024-01-27 DIAGNOSIS — Z5329 Procedure and treatment not carried out because of patient's decision for other reasons: Secondary | ICD-10-CM | POA: Diagnosis present

## 2024-01-27 DIAGNOSIS — Z8541 Personal history of malignant neoplasm of cervix uteri: Secondary | ICD-10-CM | POA: Diagnosis not present

## 2024-01-27 LAB — CBC
HCT: 39.5 % (ref 36.0–46.0)
Hemoglobin: 13.2 g/dL (ref 12.0–15.0)
MCH: 31.1 pg (ref 26.0–34.0)
MCHC: 33.4 g/dL (ref 30.0–36.0)
MCV: 92.9 fL (ref 80.0–100.0)
Platelets: 368 10*3/uL (ref 150–400)
RBC: 4.25 MIL/uL (ref 3.87–5.11)
RDW: 12.3 % (ref 11.5–15.5)
WBC: 8.9 10*3/uL (ref 4.0–10.5)
nRBC: 0 % (ref 0.0–0.2)

## 2024-01-27 LAB — BASIC METABOLIC PANEL WITH GFR
Anion gap: 9 (ref 5–15)
BUN: 8 mg/dL (ref 6–20)
CO2: 21 mmol/L — ABNORMAL LOW (ref 22–32)
Calcium: 8.8 mg/dL — ABNORMAL LOW (ref 8.9–10.3)
Chloride: 105 mmol/L (ref 98–111)
Creatinine, Ser: 0.74 mg/dL (ref 0.44–1.00)
GFR, Estimated: >60 mL/min (ref >60)
Glucose, Bld: 146 mg/dL — ABNORMAL HIGH (ref 70–99)
Potassium: 3.5 mmol/L (ref 3.5–5.1)
Sodium: 135 mmol/L (ref 135–145)

## 2024-01-27 MED ORDER — MAGNESIUM CITRATE PO SOLN
1.0000 | Freq: Once | ORAL | Status: AC
  Administered 2024-01-27: 1 via ORAL
  Filled 2024-01-27: qty 296

## 2024-01-27 NOTE — Progress Notes (Signed)
 Patient upset saying that " her bill is going to be high and she is getting no where." RN spoke with patient and educated her about her the risk of leaving AMA. Patient refused and left AMA.

## 2024-01-27 NOTE — Progress Notes (Signed)
 Subjective/Chief Complaint: Was advanced to FLD yesterday and tried grits for breakfast followed by increased nausea, bloating and localized RLQ pain. She then tried chicken broth around lunch time with same symptoms. No emesis. Passing flatus. No BM since Friday morning. Since the broth she has been sipping on clears including Gatorade with symptoms controlled. Mild RLQ with palpation this am. No nausea. She says pain may be similar to endometriosis symptoms she has had in the past   Objective: Vital signs in last 24 hours: Temp:  [97.7 F (36.5 C)-98.3 F (36.8 C)] 98.3 F (36.8 C) (04/07 0515) Pulse Rate:  [66-80] 80 (04/07 0515) Resp:  [15-16] 15 (04/07 0515) BP: (131-150)/(78-97) 132/78 (04/07 0515) SpO2:  [98 %-100 %] 98 % (04/07 0515) Last BM Date : 01/24/24  Intake/Output from previous day: 04/06 0701 - 04/07 0700 In: 1830 [P.O.:1830] Out: 0  Intake/Output this shift: No intake/output data recorded.  General appearance: alert, cooperative, and mild distress Resp: breathing comfortably GI: soft, ND. Mild focal TTP in RLQ Extremities: extremities normal, atraumatic, no cyanosis or edema  Lab Results:  Recent Labs    01/25/24 0457 01/26/24 0835  WBC 10.0 8.5  HGB 12.9 13.7  HCT 40.2 41.4  PLT 336 298   BMET Recent Labs    01/25/24 0457 01/26/24 0835  NA 136 135  K 4.0 4.7  CL 105 105  CO2 22 22  GLUCOSE 102* 98  BUN 9 7  CREATININE 0.60 0.67  CALCIUM 8.5* 8.6*   PT/INR No results for input(s): "LABPROT", "INR" in the last 72 hours. ABG No results for input(s): "PHART", "HCO3" in the last 72 hours.  Invalid input(s): "PCO2", "PO2"  Studies/Results: CT ABDOMEN PELVIS W CONTRAST Result Date: 01/25/2024 CLINICAL DATA:  Right lower quadrant pain. EXAM: CT ABDOMEN AND PELVIS WITH CONTRAST TECHNIQUE: Multidetector CT imaging of the abdomen and pelvis was performed using the standard protocol following bolus administration of intravenous contrast. RADIATION  DOSE REDUCTION: This exam was performed according to the departmental dose-optimization program which includes automated exposure control, adjustment of the mA and/or kV according to patient size and/or use of iterative reconstruction technique. CONTRAST:  OMNIPAQUE IOHEXOL 300 MG/ML  SOLN COMPARISON:  01/24/2024 and chest CTA on 12/06/2020 FINDINGS: Lower Chest: No acute findings. Stable tiny 5 mm pulmonary nodule in the medial right lower lobe, consistent with benign etiology. Hepatobiliary: No suspicious hepatic masses identified. Mild diffuse hepatic steatosis. Probable tiny gallstones along the dependent gallbladder wall. No signs of cholecystitis or biliary ductal dilatation. Pancreas:  No mass or inflammatory changes. Spleen: Within normal limits in size and appearance. Adrenals/Urinary Tract: No suspicious masses identified. No evidence of ureteral calculi or hydronephrosis. Stomach/Bowel: No evidence of obstruction, inflammatory process or abnormal fluid collections. Although the appendix is not directly visualized, no inflammatory process seen in region of the cecum or elsewhere. Vascular/Lymphatic: No pathologically enlarged lymph nodes. No acute vascular findings. Reproductive: Prior hysterectomy. No pelvic mass identified. Tiny amount of free fluid incidentally noted. Other:  None. Musculoskeletal: No suspicious bone lesions identified. Neurostimulator device seen in the left buttock, with leads in the thoracic spinal canal. IMPRESSION: No evidence of appendicitis or other acute findings. Suspect tiny gallstones. No radiographic evidence of cholecystitis or biliary ductal dilatation. Mild hepatic steatosis. Electronically Signed   By: Danae Orleans M.D.   On: 01/25/2024 15:48    Anti-infectives: Anti-infectives (From admission, onward)    Start     Dose/Rate Route Frequency Ordered Stop   01/24/24 2000  piperacillin-tazobactam (ZOSYN) IVPB 3.375 g  Status:  Discontinued        3.375 g 100  mL/hr over 30 Minutes Intravenous  Once 01/24/24 1959 01/24/24 2139       Assessment/Plan:  RLQ pain Unclear cause. WBCs normal yesterday. Will repeat this am  Does not appear to be appendicitis - Appendix is not visualized, but hasn't been seen on CT scans as far back as 2015 when no RLQ pain was present.  No inflammation. Possible endometriosis vs pelvic floor spasm in setting of h/o laparoscopic radical hysterectomy with removal of the right ovary for cervical cancer  Ongoing PO intolerance (recurrent pain). Will discuss with MD utility of further imaging such as pelvic US vs medicine consult  Continue trying to advance diet today   LOS: 0 days   Eric Form, [facility] Surgery 01/27/2024, 9:52 AM Please see Amion for pager number during day hours 7:00am-4:30pm

## 2024-01-27 NOTE — Plan of Care (Signed)
   Problem: Education: Goal: Knowledge of General Education information will improve Description: Including pain rating scale, medication(s)/side effects and non-pharmacologic comfort measures Outcome: Progressing   Problem: Activity: Goal: Risk for activity intolerance will decrease Outcome: Progressing

## 2024-01-29 LAB — CULTURE, BLOOD (ROUTINE X 2)
Culture: NO GROWTH
Culture: NO GROWTH
Special Requests: ADEQUATE

## 2024-05-05 ENCOUNTER — Other Ambulatory Visit: Payer: Self-pay

## 2024-05-05 ENCOUNTER — Encounter (HOSPITAL_BASED_OUTPATIENT_CLINIC_OR_DEPARTMENT_OTHER): Payer: Self-pay | Admitting: Radiology

## 2024-05-05 ENCOUNTER — Emergency Department (HOSPITAL_BASED_OUTPATIENT_CLINIC_OR_DEPARTMENT_OTHER)

## 2024-05-05 ENCOUNTER — Emergency Department (HOSPITAL_BASED_OUTPATIENT_CLINIC_OR_DEPARTMENT_OTHER)
Admission: EM | Admit: 2024-05-05 | Discharge: 2024-05-05 | Disposition: A | Discharge status: Home / Self Care | Attending: Emergency Medicine | Admitting: Emergency Medicine

## 2024-05-05 DIAGNOSIS — Z79899 Other long term (current) drug therapy: Secondary | ICD-10-CM | POA: Insufficient documentation

## 2024-05-05 DIAGNOSIS — I10 Essential (primary) hypertension: Secondary | ICD-10-CM | POA: Diagnosis not present

## 2024-05-05 DIAGNOSIS — D72829 Elevated white blood cell count, unspecified: Secondary | ICD-10-CM | POA: Diagnosis not present

## 2024-05-05 DIAGNOSIS — R1013 Epigastric pain: Secondary | ICD-10-CM | POA: Insufficient documentation

## 2024-05-05 LAB — COMPREHENSIVE METABOLIC PANEL WITH GFR
ALT: 21 U/L (ref 0–44)
AST: 15 U/L (ref 15–41)
Albumin: 4.4 g/dL (ref 3.5–5.0)
Alkaline Phosphatase: 38 U/L (ref 38–126)
Anion gap: 13 (ref 5–15)
BUN: 11 mg/dL (ref 6–20)
CO2: 23 mmol/L (ref 22–32)
Calcium: 9.6 mg/dL (ref 8.9–10.3)
Chloride: 103 mmol/L (ref 98–111)
Creatinine, Ser: 0.71 mg/dL (ref 0.44–1.00)
GFR, Estimated: >60 mL/min (ref >60)
Glucose, Bld: 98 mg/dL (ref 70–99)
Potassium: 3.9 mmol/L (ref 3.5–5.1)
Sodium: 140 mmol/L (ref 135–145)
Total Bilirubin: 0.3 mg/dL (ref 0.0–1.2)
Total Protein: 7.1 g/dL (ref 6.5–8.1)

## 2024-05-05 LAB — CBC
HCT: 39.3 % (ref 36.0–46.0)
Hemoglobin: 13.3 g/dL (ref 12.0–15.0)
MCH: 30.6 pg (ref 26.0–34.0)
MCHC: 33.8 g/dL (ref 30.0–36.0)
MCV: 90.3 fL (ref 80.0–100.0)
Platelets: 421 K/uL — ABNORMAL HIGH (ref 150–400)
RBC: 4.35 MIL/uL (ref 3.87–5.11)
RDW: 12.4 % (ref 11.5–15.5)
WBC: 12.3 K/uL — ABNORMAL HIGH (ref 4.0–10.5)
nRBC: 0 % (ref 0.0–0.2)

## 2024-05-05 LAB — URINALYSIS, ROUTINE W REFLEX MICROSCOPIC
Bilirubin Urine: NEGATIVE
Glucose, UA: NEGATIVE mg/dL
Hgb urine dipstick: NEGATIVE
Ketones, ur: NEGATIVE mg/dL
Leukocytes,Ua: NEGATIVE
Nitrite: NEGATIVE
Protein, ur: NEGATIVE mg/dL
Specific Gravity, Urine: 1.01 (ref 1.005–1.030)
pH: 6.5 (ref 5.0–8.0)

## 2024-05-05 LAB — LIPASE, BLOOD: Lipase: 30 U/L (ref 11–51)

## 2024-05-05 MED ORDER — MORPHINE SULFATE (PF) 4 MG/ML IV SOLN
INTRAVENOUS | Status: AC
  Filled 2024-05-05: qty 1

## 2024-05-05 MED ORDER — HYDROCODONE-ACETAMINOPHEN 5-325 MG PO TABS: 1.0000 | ORAL_TABLET | Freq: Four times a day (QID) | ORAL | 0 refills | Status: AC | PRN

## 2024-05-05 MED ORDER — ONDANSETRON HCL 4 MG/2ML IJ SOLN
4.0000 mg | Freq: Once | INTRAMUSCULAR | Status: AC
  Administered 2024-05-05: 4 mg via INTRAVENOUS

## 2024-05-05 MED ORDER — HYDROMORPHONE HCL 1 MG/ML IJ SOLN
1.0000 mg | Freq: Once | INTRAMUSCULAR | Status: AC
  Administered 2024-05-05: 1 mg via INTRAVENOUS
  Filled 2024-05-05: qty 1

## 2024-05-05 MED ORDER — ONDANSETRON HCL 4 MG/2ML IJ SOLN
INTRAMUSCULAR | Status: AC
  Filled 2024-05-05: qty 2

## 2024-05-05 MED ORDER — IOHEXOL 300 MG/ML  SOLN
100.0000 mL | Freq: Once | INTRAMUSCULAR | Status: AC | PRN
  Administered 2024-05-05: 100 mL via INTRAVENOUS

## 2024-05-05 MED ORDER — ONDANSETRON 4 MG PO TBDP: 4.0000 mg | ORAL_TABLET | Freq: Four times a day (QID) | ORAL | 0 refills | Status: AC | PRN

## 2024-05-05 MED ORDER — MORPHINE SULFATE (PF) 4 MG/ML IV SOLN
4.0000 mg | Freq: Once | INTRAVENOUS | Status: AC
  Administered 2024-05-05: 4 mg via INTRAVENOUS

## 2024-05-05 NOTE — Discharge Instructions (Addendum)
 Please use Tylenol  or ibuprofen  for pain.  You may use 600 mg ibuprofen  every 6 hours or 1000 mg of Tylenol  every 6 hours.  You may choose to alternate between the 2.  This would be most effective.  Not to exceed 4 g of Tylenol  within 24 hours.  Not to exceed 3200 mg ibuprofen  24 hours.  You can use the stronger narcotic pain medication in place of Tylenol  for severe break through pain.  If you take the narcotic pain medication that we prescribed recommend that you also take a laxative such as MiraLAX  or Dulcolax every day that you take the narcotic pain medicine, and drink plenty of fluids, 50 to 64 ounces to prevent any constipation.  Follow-up with your surgeon as planned in the morning.

## 2024-05-05 NOTE — ED Provider Notes (Signed)
 [place] [facility] Provider Note   CSN: [id number] Arrival date & time: 05/05/24  1812     Patient presents with: Abdominal Pain   Kelly Buck is a 43 y.o. female with past medical history significant for anxiety, migraines, recent cholecystectomy 3 months ago who presents concern for severe worsening epigastric abdominal pain over the last 2 days.  She reports that she has been having problems since the gallbladder removal with nausea, difficulty eating, but reports significant worsening pain starting today.  She is supposed to see her surgeon at [facility].     Abdominal Pain      Prior to Admission medications   Medication Sig Start Date End Date Taking? Authorizing Provider  HYDROcodone -acetaminophen  (NORCO/VICODIN) 5-325 MG tablet Take 1 tablet by mouth every 6 (six) hours as needed. 05/05/24  Yes Devony Mcgrady H, PA-C  ondansetron  (ZOFRAN -ODT) 4 MG disintegrating tablet Take 1 tablet (4 mg total) by mouth every 6 (six) hours as needed for nausea or vomiting. 05/05/24  Yes Elester Apodaca H, PA-C  acetaminophen  (TYLENOL ) 500 MG tablet Take 2 tablets (1,000 mg total) by mouth every 6 (six) hours. 01/26/24   Aron Shoulders, MD  imipramine  (TOFRANIL ) 50 MG tablet Take 50 mg by mouth at bedtime.    [provider]  LORazepam  (ATIVAN ) 0.5 MG tablet Take 1-2 pills 30 minutes before MRI Patient not taking: Reported on 01/24/2024 05/23/22   Rush Nest, MD  methocarbamol  (ROBAXIN ) 500 MG tablet Take 1.5 tablets (750 mg total) by mouth every 6 (six) hours as needed (use for muscle cramps/pain). 01/26/24   Aron Shoulders, MD  oxyCODONE  (OXY IR/ROXICODONE ) 5 MG immediate release tablet Take 1-2 tablets (5-10 mg total) by mouth every 6 (six) hours as needed for moderate pain (pain score 4-6). 01/26/24   Aron Shoulders, MD  verapamil  (CALAN -SR) 240 MG CR tablet Take 240 mg by mouth at bedtime.    [provider]    Allergies: Patient has  no known allergies.    Review of Systems  Gastrointestinal:  Positive for abdominal pain.  All other systems reviewed and are negative.   Updated Vital Signs BP (!) 152/96 (BP Location: Right Arm)   Pulse 62   Temp 97.9 F (36.6 C) (Oral)   Resp 16   Ht 5' 5 (1.651 m)   Wt 95.3 kg   LMP 11/21/2020   SpO2 100%   BMI 34.95 kg/m   Physical Exam Vitals and nursing note reviewed.  Constitutional:      General: She is not in acute distress.    Appearance: Normal appearance.  HENT:     Head: Normocephalic and atraumatic.  Eyes:     General:        Right eye: No discharge.        Left eye: No discharge.  Cardiovascular:     Rate and Rhythm: Normal rate and regular rhythm.     Heart sounds: No murmur heard.    No friction rub. No gallop.  Pulmonary:     Effort: Pulmonary effort is normal.     Breath sounds: Normal breath sounds.  Abdominal:     General: Bowel sounds are normal.     Palpations: Abdomen is soft.     Comments: Focal ttp in epigastric region, some guarding, no rebound, rigidity  Skin:    General: Skin is warm and dry.     Capillary Refill: Capillary refill takes less than 2 seconds.  Neurological:  Mental Status: She is alert and oriented to person, place, and time.  Psychiatric:        Mood and Affect: Mood normal.        Behavior: Behavior normal.     (all labs ordered are listed, but only abnormal results are displayed) Labs Reviewed  CBC - Abnormal; Notable for the following components:      Result Value   WBC 12.3 (*)    Platelets 421 (*)    All other components within normal limits  LIPASE, BLOOD  COMPREHENSIVE METABOLIC PANEL WITH GFR  URINALYSIS, ROUTINE W REFLEX MICROSCOPIC    EKG: None  Radiology: CT ABDOMEN PELVIS W CONTRAST Result Date: 05/05/2024 CLINICAL DATA:  Acute abdominal pain. History of cholecystectomy 3 months ago. EXAM: CT ABDOMEN AND PELVIS WITH CONTRAST TECHNIQUE: Multidetector CT imaging of the abdomen and pelvis  was performed using the standard protocol following bolus administration of intravenous contrast. RADIATION DOSE REDUCTION: This exam was performed according to the departmental dose-optimization program which includes automated exposure control, adjustment of the mA and/or kV according to patient size and/or use of iterative reconstruction technique. CONTRAST:  OMNIPAQUE  IOHEXOL  300 MG/ML  SOLN COMPARISON:  01/25/2024 FINDINGS: Lower chest: The lung bases are clear of acute process. No pleural effusion or pulmonary lesions. Stable calcified granuloma in the right lower lobe. The heart is normal in size. No pericardial effusion. The distal esophagus and aorta are unremarkable. Hepatobiliary: Suspect mild diffuse fatty infiltration of the liver but no hepatic lesions or intrahepatic biliary dilatation. The gallbladder is surgically absent. No common bile duct dilatation. Pancreas: Unremarkable. No pancreatic ductal dilatation or surrounding inflammatory changes. Spleen: Normal in size without focal abnormality. Adrenals/Urinary Tract: Adrenal glands are unremarkable. Kidneys are normal, without renal calculi, focal lesion, or hydronephrosis. Bladder is unremarkable. Stomach/Bowel: The stomach, duodenum, small bowel and colon are grossly normal without oral contrast. No inflammatory changes, mass lesions or obstructive findings. The appendix is normal. The appendix is not identified and is likely surgically absent. The terminal ileum is normal. Vascular/Lymphatic: The aorta is normal in caliber. No dissection. The branch vessels are patent. The major venous structures are patent. No mesenteric or retroperitoneal mass or adenopathy. Small scattered lymph nodes are noted. Reproductive: Surgically absent. Other: No pelvic mass or adenopathy. No free pelvic fluid collections. No inguinal mass or adenopathy. No abdominal wall hernia or subcutaneous lesions. Musculoskeletal: No significant bony findings. Advanced  degenerative disc disease at L5-S1. Spinal cord stimulator noted. IMPRESSION: 1. No acute abdominal/pelvic findings, mass lesions or adenopathy. 2. Status post cholecystectomy. No biliary dilatation. 3. Suspect mild diffuse fatty infiltration of the liver. Electronically Signed   By: MYRTIS Stammer M.D.   On: 05/05/2024 23:30     Procedures   Medications Ordered in the ED  morphine  (PF) 4 MG/ML injection 4 mg (4 mg Intravenous Given 05/05/24 2225)  ondansetron  (ZOFRAN ) injection 4 mg (4 mg Intravenous Given 05/05/24 2225)  iohexol  (OMNIPAQUE ) 300 MG/ML solution 100 mL (100 mLs Intravenous Contrast Given 05/05/24 2316)  HYDROmorphone  (DILAUDID ) injection 1 mg (1 mg Intravenous Given 05/05/24 2332)                                    Medical Decision Making Amount and/or Complexity of Data Reviewed Labs: ordered. Radiology: ordered.  Risk Prescription drug management.   This patient is a 43 y.o. female  who presents to the ED for  concern of abdominal pain, nausea, vomiting.   Differential diagnoses prior to evaluation: The emergent differential diagnosis includes, but is not limited to,  The causes of generalized abdominal pain include but are not limited to AAA, mesenteric ischemia, appendicitis, diverticulitis, DKA, gastritis, gastroenteritis, AMI, nephrolithiasis, pancreatitis, peritonitis, adrenal insufficiency,lead poisoning, iron toxicity, intestinal ischemia, constipation, UTI,SBO/LBO, splenic rupture, biliary disease, IBD, IBS, PUD, or hepatitis . This is not an exhaustive differential.   Past Medical History / Co-morbidities / Social History: anxiety, migraines, recent cholecystectomy 3 months ago  Additional history: Chart reviewed. Pertinent results include: Reviewed lab work, imaging from previous emergency department visits, recent surgery, cholecystectomy.  Physical Exam: Physical exam performed. The pertinent findings include: Focal tenderness in the epigastric region,  significant hypertension arrival, 172/103, suspect secondary to pain  Lab Tests/Imaging studies: I personally interpreted labs/imaging and the pertinent results include: CBC notable for elevated white blood cells, 12.3, mildly elevated platelets of 421, normal UA, normal lipase, normal CMP, notably with normal liver enzymes.  CT abdomen pelvis with contrast shows no evidence of acute abnormality. I agree with the radiologist interpretation.   Medications: I ordered medication including morphine , Zofran  for pain, nausea.  I have reviewed the patients home medicines and have made adjustments as needed.  Given degree of pain will discharge with very short course of pain medicine, otherwise please follow-up with your surgeon in the morning.   Disposition: After consideration of the diagnostic results and the patients response to treatment, I feel that patient is stable for discharge with plan as above .   emergency department workup does not suggest an emergent condition requiring admission or immediate intervention beyond what has been performed at this time. The plan is: as above. The patient is safe for discharge and has been instructed to return immediately for worsening symptoms, change in symptoms or any other concerns.   Final diagnoses:  Epigastric pain    ED Discharge Orders          Ordered    HYDROcodone -acetaminophen  (NORCO/VICODIN) 5-325 MG tablet  Every 6 hours PRN        05/05/24 2343    ondansetron  (ZOFRAN -ODT) 4 MG disintegrating tablet  Every 6 hours PRN        05/05/24 2343               Alleigh Mollica H, PA-C 05/05/24 2343    Elnor Hila P, DO 05/07/24 1535

## 2024-05-05 NOTE — ED Triage Notes (Signed)
 Pt had her gallbladder taken out 3 months ago and she is supposed to have her follow up in the morning at [facility], which is where she had her gallbladder removed. Pt states her abdomen is swollen and she is having severe pain like when she was having a gallbladder attack. She states her stomach feels like it is going to explode. The pain started this morning as mild and has gradually gotten worse.

## 2024-05-07 ENCOUNTER — Other Ambulatory Visit: Payer: Self-pay | Admitting: Internal Medicine

## 2024-05-07 DIAGNOSIS — R1013 Epigastric pain: Secondary | ICD-10-CM

## 2024-05-07 DIAGNOSIS — R112 Nausea with vomiting, unspecified: Secondary | ICD-10-CM

## 2024-05-08 ENCOUNTER — Encounter: Payer: Self-pay | Admitting: Advanced Practice Midwife

## 2024-05-12 ENCOUNTER — Ambulatory Visit
Admission: RE | Admit: 2024-05-12 | Discharge: 2024-05-12 | Disposition: A | Discharge status: Home / Self Care | Source: Ambulatory Visit | Attending: Internal Medicine | Admitting: Internal Medicine

## 2024-05-12 DIAGNOSIS — R1013 Epigastric pain: Secondary | ICD-10-CM

## 2024-05-12 DIAGNOSIS — R112 Nausea with vomiting, unspecified: Secondary | ICD-10-CM

## 2024-06-11 ENCOUNTER — Other Ambulatory Visit: Payer: Self-pay | Admitting: Physician Assistant

## 2024-06-11 DIAGNOSIS — M25521 Pain in right elbow: Secondary | ICD-10-CM

## 2024-06-14 ENCOUNTER — Ambulatory Visit
Admission: RE | Admit: 2024-06-14 | Discharge: 2024-06-14 | Disposition: A | Discharge status: Home / Self Care | Source: Ambulatory Visit | Attending: Physician Assistant | Admitting: Physician Assistant

## 2024-06-14 DIAGNOSIS — M25521 Pain in right elbow: Secondary | ICD-10-CM
